# Patient Record
Sex: Female | Born: 1960 | Race: White | Hispanic: No | Smoking: Former smoker
Health system: Southern US, Community
[De-identification: ages and names within clinical notes are randomized; demographics above are authoritative.]

## PROBLEM LIST (undated history)

## (undated) DIAGNOSIS — K859 Acute pancreatitis without necrosis or infection, unspecified: Secondary | ICD-10-CM

## (undated) DIAGNOSIS — E7800 Pure hypercholesterolemia, unspecified: Secondary | ICD-10-CM

## (undated) DIAGNOSIS — M1990 Unspecified osteoarthritis, unspecified site: Secondary | ICD-10-CM

## (undated) DIAGNOSIS — M797 Fibromyalgia: Secondary | ICD-10-CM

## (undated) DIAGNOSIS — R011 Cardiac murmur, unspecified: Secondary | ICD-10-CM

## (undated) DIAGNOSIS — F329 Major depressive disorder, single episode, unspecified: Secondary | ICD-10-CM

## (undated) DIAGNOSIS — F32A Depression, unspecified: Secondary | ICD-10-CM

## (undated) DIAGNOSIS — I10 Essential (primary) hypertension: Secondary | ICD-10-CM

## (undated) DIAGNOSIS — E039 Hypothyroidism, unspecified: Secondary | ICD-10-CM

## (undated) HISTORY — PX: OTHER SURGICAL HISTORY: SHX169

## (undated) HISTORY — PX: REPLACEMENT TOTAL HIP W/  RESURFACING IMPLANTS: SUR1222

## (undated) HISTORY — PX: TONSILLECTOMY: SUR1361

## (undated) HISTORY — PX: ABDOMINAL SURGERY: SHX537

## (undated) HISTORY — PX: ABDOMINAL HYSTERECTOMY: SHX81

## (undated) HISTORY — DX: Acute pancreatitis without necrosis or infection, unspecified: K85.90

## (undated) HISTORY — PX: HERNIA REPAIR: SHX51

---

## 1998-01-18 ENCOUNTER — Other Ambulatory Visit: Admission: RE | Admit: 1998-01-18 | Discharge: 1998-01-18 | Payer: Self-pay | Admitting: Obstetrics and Gynecology

## 1998-12-23 ENCOUNTER — Emergency Department (HOSPITAL_COMMUNITY): Admission: EM | Admit: 1998-12-23 | Discharge: 1998-12-23 | Payer: Self-pay | Admitting: Emergency Medicine

## 1999-06-16 ENCOUNTER — Other Ambulatory Visit: Admission: RE | Admit: 1999-06-16 | Discharge: 1999-06-16 | Payer: Self-pay | Admitting: Obstetrics and Gynecology

## 2000-09-27 ENCOUNTER — Inpatient Hospital Stay (HOSPITAL_COMMUNITY): Admission: EM | Admit: 2000-09-27 | Discharge: 2000-10-04 | Payer: Self-pay | Admitting: Psychiatry

## 2000-11-12 ENCOUNTER — Inpatient Hospital Stay (HOSPITAL_COMMUNITY): Admission: EM | Admit: 2000-11-12 | Discharge: 2000-11-19 | Payer: Self-pay | Admitting: Psychiatry

## 2003-02-06 ENCOUNTER — Emergency Department (HOSPITAL_COMMUNITY): Admission: EM | Admit: 2003-02-06 | Discharge: 2003-02-07 | Payer: Self-pay | Admitting: Emergency Medicine

## 2003-02-06 ENCOUNTER — Encounter: Payer: Self-pay | Admitting: Emergency Medicine

## 2003-02-23 ENCOUNTER — Ambulatory Visit (HOSPITAL_COMMUNITY): Admission: RE | Admit: 2003-02-23 | Discharge: 2003-02-23 | Payer: Self-pay | Admitting: Family Medicine

## 2003-02-23 ENCOUNTER — Encounter: Payer: Self-pay | Admitting: Family Medicine

## 2003-09-22 ENCOUNTER — Ambulatory Visit (HOSPITAL_COMMUNITY): Admission: RE | Admit: 2003-09-22 | Discharge: 2003-09-22 | Payer: Self-pay | Admitting: Internal Medicine

## 2004-05-31 ENCOUNTER — Ambulatory Visit (HOSPITAL_COMMUNITY): Admission: RE | Admit: 2004-05-31 | Discharge: 2004-05-31 | Payer: Self-pay | Admitting: Family Medicine

## 2004-07-11 ENCOUNTER — Ambulatory Visit: Payer: Self-pay | Admitting: Nurse Practitioner

## 2004-07-23 ENCOUNTER — Emergency Department (HOSPITAL_COMMUNITY): Admission: EM | Admit: 2004-07-23 | Discharge: 2004-07-23 | Payer: Self-pay | Admitting: Emergency Medicine

## 2004-08-24 ENCOUNTER — Ambulatory Visit: Payer: Self-pay | Admitting: Nurse Practitioner

## 2004-10-03 ENCOUNTER — Ambulatory Visit: Payer: Self-pay | Admitting: Nurse Practitioner

## 2004-10-04 ENCOUNTER — Ambulatory Visit: Payer: Self-pay | Admitting: Nurse Practitioner

## 2004-12-22 ENCOUNTER — Ambulatory Visit: Payer: Self-pay | Admitting: Nurse Practitioner

## 2004-12-23 ENCOUNTER — Ambulatory Visit (HOSPITAL_COMMUNITY): Admission: RE | Admit: 2004-12-23 | Discharge: 2004-12-23 | Payer: Self-pay | Admitting: Nurse Practitioner

## 2005-01-30 ENCOUNTER — Ambulatory Visit: Payer: Self-pay | Admitting: Nurse Practitioner

## 2005-03-13 ENCOUNTER — Ambulatory Visit: Payer: Self-pay | Admitting: Nurse Practitioner

## 2005-04-10 ENCOUNTER — Ambulatory Visit (HOSPITAL_COMMUNITY): Admission: RE | Admit: 2005-04-10 | Discharge: 2005-04-10 | Payer: Self-pay | Admitting: Internal Medicine

## 2005-04-10 ENCOUNTER — Ambulatory Visit: Payer: Self-pay | Admitting: Nurse Practitioner

## 2005-05-29 ENCOUNTER — Encounter (INDEPENDENT_AMBULATORY_CARE_PROVIDER_SITE_OTHER): Payer: Self-pay | Admitting: Nurse Practitioner

## 2005-05-29 LAB — CONVERTED CEMR LAB

## 2005-06-07 ENCOUNTER — Ambulatory Visit: Payer: Self-pay | Admitting: Nurse Practitioner

## 2005-06-12 ENCOUNTER — Ambulatory Visit (HOSPITAL_COMMUNITY): Admission: RE | Admit: 2005-06-12 | Discharge: 2005-06-12 | Payer: Self-pay | Admitting: Nurse Practitioner

## 2005-06-21 ENCOUNTER — Ambulatory Visit: Payer: Self-pay | Admitting: Nurse Practitioner

## 2005-06-28 ENCOUNTER — Emergency Department (HOSPITAL_COMMUNITY): Admission: EM | Admit: 2005-06-28 | Discharge: 2005-06-28 | Payer: Self-pay | Admitting: Emergency Medicine

## 2005-07-18 ENCOUNTER — Ambulatory Visit: Payer: Self-pay | Admitting: Nurse Practitioner

## 2005-07-23 ENCOUNTER — Emergency Department (HOSPITAL_COMMUNITY): Admission: EM | Admit: 2005-07-23 | Discharge: 2005-07-24 | Payer: Self-pay | Admitting: *Deleted

## 2005-08-13 ENCOUNTER — Ambulatory Visit: Payer: Self-pay | Admitting: Nurse Practitioner

## 2005-09-18 ENCOUNTER — Ambulatory Visit: Payer: Self-pay | Admitting: Nurse Practitioner

## 2005-11-13 ENCOUNTER — Ambulatory Visit: Payer: Self-pay | Admitting: Nurse Practitioner

## 2005-12-26 ENCOUNTER — Ambulatory Visit: Payer: Self-pay | Admitting: Nurse Practitioner

## 2006-01-29 ENCOUNTER — Ambulatory Visit: Payer: Self-pay | Admitting: Nurse Practitioner

## 2006-03-19 ENCOUNTER — Ambulatory Visit: Payer: Self-pay | Admitting: Nurse Practitioner

## 2006-05-14 ENCOUNTER — Ambulatory Visit: Payer: Self-pay | Admitting: Nurse Practitioner

## 2006-07-17 ENCOUNTER — Ambulatory Visit: Payer: Self-pay | Admitting: Nurse Practitioner

## 2006-08-07 ENCOUNTER — Ambulatory Visit: Payer: Self-pay | Admitting: Nurse Practitioner

## 2006-09-12 ENCOUNTER — Ambulatory Visit: Payer: Self-pay | Admitting: Nurse Practitioner

## 2006-09-24 ENCOUNTER — Encounter
Admission: RE | Admit: 2006-09-24 | Discharge: 2006-12-23 | Payer: Self-pay | Admitting: Physical Medicine and Rehabilitation

## 2006-10-09 ENCOUNTER — Ambulatory Visit: Payer: Self-pay | Admitting: Physical Medicine and Rehabilitation

## 2006-10-14 ENCOUNTER — Ambulatory Visit: Payer: Self-pay | Admitting: Family Medicine

## 2006-10-16 ENCOUNTER — Encounter
Admission: RE | Admit: 2006-10-16 | Discharge: 2006-10-16 | Payer: Self-pay | Admitting: Physical Medicine and Rehabilitation

## 2006-11-04 ENCOUNTER — Ambulatory Visit: Payer: Self-pay | Admitting: Internal Medicine

## 2006-11-07 ENCOUNTER — Ambulatory Visit (HOSPITAL_COMMUNITY)
Admission: RE | Admit: 2006-11-07 | Discharge: 2006-11-07 | Payer: Self-pay | Admitting: Physical Medicine and Rehabilitation

## 2006-11-13 ENCOUNTER — Ambulatory Visit: Payer: Self-pay | Admitting: Internal Medicine

## 2006-12-03 ENCOUNTER — Ambulatory Visit: Payer: Self-pay | Admitting: Physical Medicine and Rehabilitation

## 2006-12-03 ENCOUNTER — Encounter
Admission: RE | Admit: 2006-12-03 | Discharge: 2007-03-03 | Payer: Self-pay | Admitting: Physical Medicine and Rehabilitation

## 2006-12-04 ENCOUNTER — Encounter: Payer: Self-pay | Admitting: Internal Medicine

## 2006-12-10 ENCOUNTER — Ambulatory Visit: Payer: Self-pay | Admitting: Nurse Practitioner

## 2007-01-14 ENCOUNTER — Ambulatory Visit: Payer: Self-pay | Admitting: Nurse Practitioner

## 2007-03-29 ENCOUNTER — Encounter (INDEPENDENT_AMBULATORY_CARE_PROVIDER_SITE_OTHER): Payer: Self-pay | Admitting: Nurse Practitioner

## 2007-03-29 DIAGNOSIS — E669 Obesity, unspecified: Secondary | ICD-10-CM | POA: Insufficient documentation

## 2007-03-29 DIAGNOSIS — F411 Generalized anxiety disorder: Secondary | ICD-10-CM | POA: Insufficient documentation

## 2007-03-29 DIAGNOSIS — F3130 Bipolar disorder, current episode depressed, mild or moderate severity, unspecified: Secondary | ICD-10-CM | POA: Insufficient documentation

## 2007-03-29 DIAGNOSIS — Z9079 Acquired absence of other genital organ(s): Secondary | ICD-10-CM | POA: Insufficient documentation

## 2007-03-29 DIAGNOSIS — E039 Hypothyroidism, unspecified: Secondary | ICD-10-CM | POA: Insufficient documentation

## 2007-03-29 DIAGNOSIS — M159 Polyosteoarthritis, unspecified: Secondary | ICD-10-CM | POA: Insufficient documentation

## 2007-03-29 DIAGNOSIS — G894 Chronic pain syndrome: Secondary | ICD-10-CM | POA: Insufficient documentation

## 2007-03-29 DIAGNOSIS — IMO0002 Reserved for concepts with insufficient information to code with codable children: Secondary | ICD-10-CM | POA: Insufficient documentation

## 2007-04-16 ENCOUNTER — Ambulatory Visit: Payer: Self-pay | Admitting: Internal Medicine

## 2007-04-23 ENCOUNTER — Emergency Department (HOSPITAL_COMMUNITY): Admission: EM | Admit: 2007-04-23 | Discharge: 2007-04-23 | Payer: Self-pay | Admitting: Emergency Medicine

## 2007-06-05 ENCOUNTER — Ambulatory Visit: Payer: Self-pay | Admitting: Family Medicine

## 2007-06-17 ENCOUNTER — Ambulatory Visit: Payer: Self-pay | Admitting: Internal Medicine

## 2007-08-07 ENCOUNTER — Ambulatory Visit: Payer: Self-pay | Admitting: Family Medicine

## 2007-08-22 ENCOUNTER — Ambulatory Visit: Payer: Self-pay | Admitting: Family Medicine

## 2007-10-14 ENCOUNTER — Emergency Department (HOSPITAL_COMMUNITY): Admission: EM | Admit: 2007-10-14 | Discharge: 2007-10-14 | Payer: Self-pay | Admitting: Emergency Medicine

## 2007-11-02 ENCOUNTER — Emergency Department (HOSPITAL_COMMUNITY): Admission: EM | Admit: 2007-11-02 | Discharge: 2007-11-02 | Payer: Self-pay | Admitting: Emergency Medicine

## 2007-12-27 ENCOUNTER — Emergency Department (HOSPITAL_COMMUNITY): Admission: EM | Admit: 2007-12-27 | Discharge: 2007-12-27 | Payer: Self-pay | Admitting: Family Medicine

## 2008-02-12 ENCOUNTER — Encounter (INDEPENDENT_AMBULATORY_CARE_PROVIDER_SITE_OTHER): Payer: Self-pay | Admitting: Nurse Practitioner

## 2008-02-12 ENCOUNTER — Ambulatory Visit: Payer: Self-pay | Admitting: Family Medicine

## 2008-02-12 LAB — CONVERTED CEMR LAB
ALT: 20 units/L (ref 0–35)
AST: 17 units/L (ref 0–37)
Albumin: 4.3 g/dL (ref 3.5–5.2)
Alkaline Phosphatase: 94 units/L (ref 39–117)
BUN: 9 mg/dL (ref 6–23)
Basophils Absolute: 0 10*3/uL (ref 0.0–0.1)
Basophils Relative: 0 % (ref 0–1)
CO2: 25 meq/L (ref 19–32)
Calcium: 9.3 mg/dL (ref 8.4–10.5)
Chloride: 107 meq/L (ref 96–112)
Cholesterol: 219 mg/dL — ABNORMAL HIGH (ref 0–200)
Creatinine, Ser: 0.94 mg/dL (ref 0.40–1.20)
Eosinophils Absolute: 0.4 10*3/uL (ref 0.0–0.7)
Eosinophils Relative: 5 % (ref 0–5)
Glucose, Bld: 92 mg/dL (ref 70–99)
HCT: 48.2 % — ABNORMAL HIGH (ref 36.0–46.0)
HDL: 39 mg/dL — ABNORMAL LOW (ref >39)
Hemoglobin: 15.2 g/dL — ABNORMAL HIGH (ref 12.0–15.0)
LDL Cholesterol: 134 mg/dL — ABNORMAL HIGH (ref 0–99)
Lymphocytes Relative: 32 % (ref 12–46)
Lymphs Abs: 2.5 10*3/uL (ref 0.7–4.0)
MCHC: 31.5 g/dL (ref 30.0–36.0)
MCV: 95.1 fL (ref 78.0–100.0)
Monocytes Absolute: 0.8 10*3/uL (ref 0.1–1.0)
Monocytes Relative: 10 % (ref 3–12)
Neutro Abs: 4.2 10*3/uL (ref 1.7–7.7)
Neutrophils Relative %: 53 % (ref 43–77)
Platelets: 343 10*3/uL (ref 150–400)
Potassium: 4.6 meq/L (ref 3.5–5.3)
RBC: 5.07 M/uL (ref 3.87–5.11)
RDW: 14.4 % (ref 11.5–15.5)
Sodium: 142 meq/L (ref 135–145)
TSH: 0.446 microintl units/mL (ref 0.350–5.50)
Total Bilirubin: 0.6 mg/dL (ref 0.3–1.2)
Total CHOL/HDL Ratio: 5.6
Total Protein: 7.3 g/dL (ref 6.0–8.3)
Triglycerides: 231 mg/dL — ABNORMAL HIGH (ref <150)
VLDL: 46 mg/dL — ABNORMAL HIGH (ref 0–40)
WBC: 7.9 10*3/uL (ref 4.0–10.5)

## 2008-05-11 ENCOUNTER — Ambulatory Visit: Payer: Self-pay | Admitting: Internal Medicine

## 2008-05-14 ENCOUNTER — Ambulatory Visit (HOSPITAL_COMMUNITY): Admission: RE | Admit: 2008-05-14 | Discharge: 2008-05-14 | Payer: Self-pay | Admitting: Internal Medicine

## 2008-06-15 ENCOUNTER — Emergency Department (HOSPITAL_COMMUNITY): Admission: EM | Admit: 2008-06-15 | Discharge: 2008-06-15 | Payer: Self-pay | Admitting: Emergency Medicine

## 2009-04-12 ENCOUNTER — Encounter: Payer: Self-pay | Admitting: Internal Medicine

## 2009-04-12 ENCOUNTER — Ambulatory Visit: Payer: Self-pay | Admitting: Pain Medicine

## 2009-04-18 ENCOUNTER — Encounter: Payer: Self-pay | Admitting: Internal Medicine

## 2009-04-18 ENCOUNTER — Ambulatory Visit: Payer: Self-pay | Admitting: Pain Medicine

## 2009-05-03 ENCOUNTER — Ambulatory Visit: Payer: Self-pay | Admitting: Family Medicine

## 2009-05-03 ENCOUNTER — Encounter (INDEPENDENT_AMBULATORY_CARE_PROVIDER_SITE_OTHER): Payer: Self-pay | Admitting: Internal Medicine

## 2009-05-03 LAB — CONVERTED CEMR LAB
ALT: 19 units/L (ref 0–35)
AST: 20 units/L (ref 0–37)
Albumin: 4.1 g/dL (ref 3.5–5.2)
Alkaline Phosphatase: 81 units/L (ref 39–117)
Amphetamine Screen, Ur: NEGATIVE
BUN: 14 mg/dL (ref 6–23)
Barbiturate Quant, Ur: NEGATIVE
Basophils Absolute: 0 10*3/uL (ref 0.0–0.1)
Basophils Relative: 0 % (ref 0–1)
Benzodiazepines.: NEGATIVE
CO2: 30 meq/L (ref 19–32)
Calcium: 8.9 mg/dL (ref 8.4–10.5)
Chloride: 103 meq/L (ref 96–112)
Cholesterol: 193 mg/dL (ref 0–200)
Cocaine Metabolites: POSITIVE — AB
Creatinine, Ser: 0.95 mg/dL (ref 0.40–1.20)
Creatinine,U: 180.6 mg/dL
Eosinophils Absolute: 0.4 10*3/uL (ref 0.0–0.7)
Eosinophils Relative: 5 % (ref 0–5)
Glucose, Bld: 100 mg/dL — ABNORMAL HIGH (ref 70–99)
HCT: 44.4 % (ref 36.0–46.0)
HDL: 53 mg/dL (ref >39)
Hemoglobin: 14.3 g/dL (ref 12.0–15.0)
LDL Cholesterol: 113 mg/dL — ABNORMAL HIGH (ref 0–99)
Lymphocytes Relative: 31 % (ref 12–46)
Lymphs Abs: 2.4 10*3/uL (ref 0.7–4.0)
MCHC: 32.2 g/dL (ref 30.0–36.0)
MCV: 97.2 fL (ref 78.0–100.0)
Marijuana Metabolite: POSITIVE — AB
Methadone: NEGATIVE
Monocytes Absolute: 0.8 10*3/uL (ref 0.1–1.0)
Monocytes Relative: 10 % (ref 3–12)
Neutro Abs: 4.3 10*3/uL (ref 1.7–7.7)
Neutrophils Relative %: 55 % (ref 43–77)
Opiate Screen, Urine: POSITIVE — AB
Phencyclidine (PCP): NEGATIVE
Platelets: 344 10*3/uL (ref 150–400)
Potassium: 3.5 meq/L (ref 3.5–5.3)
Propoxyphene: NEGATIVE
RBC: 4.57 M/uL (ref 3.87–5.11)
RDW: 15.1 % (ref 11.5–15.5)
Sodium: 142 meq/L (ref 135–145)
TSH: 15.621 microintl units/mL — ABNORMAL HIGH (ref 0.350–4.500)
Total Bilirubin: 0.3 mg/dL (ref 0.3–1.2)
Total CHOL/HDL Ratio: 3.6
Total Protein: 7 g/dL (ref 6.0–8.3)
Triglycerides: 133 mg/dL (ref <150)
VLDL: 27 mg/dL (ref 0–40)
WBC: 7.9 10*3/uL (ref 4.0–10.5)

## 2009-05-04 ENCOUNTER — Ambulatory Visit (HOSPITAL_COMMUNITY): Admission: RE | Admit: 2009-05-04 | Discharge: 2009-05-04 | Payer: Self-pay | Admitting: Family Medicine

## 2009-05-25 ENCOUNTER — Ambulatory Visit: Payer: Self-pay | Admitting: Internal Medicine

## 2009-05-25 DIAGNOSIS — F3289 Other specified depressive episodes: Secondary | ICD-10-CM | POA: Insufficient documentation

## 2009-05-25 DIAGNOSIS — M129 Arthropathy, unspecified: Secondary | ICD-10-CM | POA: Insufficient documentation

## 2009-05-25 DIAGNOSIS — F32A Depression, unspecified: Secondary | ICD-10-CM | POA: Insufficient documentation

## 2009-05-25 DIAGNOSIS — F1910 Other psychoactive substance abuse, uncomplicated: Secondary | ICD-10-CM | POA: Insufficient documentation

## 2009-05-25 DIAGNOSIS — F329 Major depressive disorder, single episode, unspecified: Secondary | ICD-10-CM

## 2009-05-25 DIAGNOSIS — J45909 Unspecified asthma, uncomplicated: Secondary | ICD-10-CM | POA: Insufficient documentation

## 2009-05-25 DIAGNOSIS — I10 Essential (primary) hypertension: Secondary | ICD-10-CM | POA: Insufficient documentation

## 2009-05-25 DIAGNOSIS — E785 Hyperlipidemia, unspecified: Secondary | ICD-10-CM | POA: Insufficient documentation

## 2009-05-25 DIAGNOSIS — R011 Cardiac murmur, unspecified: Secondary | ICD-10-CM | POA: Insufficient documentation

## 2009-05-25 LAB — CONVERTED CEMR LAB: Pap Smear: NORMAL

## 2009-06-01 ENCOUNTER — Encounter: Payer: Self-pay | Admitting: Internal Medicine

## 2009-06-21 ENCOUNTER — Encounter (INDEPENDENT_AMBULATORY_CARE_PROVIDER_SITE_OTHER): Payer: Self-pay | Admitting: *Deleted

## 2009-08-30 ENCOUNTER — Telehealth (INDEPENDENT_AMBULATORY_CARE_PROVIDER_SITE_OTHER): Payer: Self-pay | Admitting: *Deleted

## 2009-09-07 ENCOUNTER — Emergency Department (HOSPITAL_COMMUNITY): Admission: EM | Admit: 2009-09-07 | Discharge: 2009-09-07 | Payer: Self-pay | Admitting: Emergency Medicine

## 2009-12-12 ENCOUNTER — Telehealth: Payer: Self-pay | Admitting: Internal Medicine

## 2010-02-21 ENCOUNTER — Encounter (INDEPENDENT_AMBULATORY_CARE_PROVIDER_SITE_OTHER): Payer: Self-pay | Admitting: Internal Medicine

## 2010-02-21 ENCOUNTER — Ambulatory Visit: Payer: Self-pay | Admitting: Family Medicine

## 2010-02-21 LAB — CONVERTED CEMR LAB
ALT: 12 units/L (ref 0–35)
AST: 17 units/L (ref 0–37)
Albumin: 4.7 g/dL (ref 3.5–5.2)
Alkaline Phosphatase: 64 units/L (ref 39–117)
BUN: 10 mg/dL (ref 6–23)
Basophils Absolute: 0 10*3/uL (ref 0.0–0.1)
Basophils Relative: 0 % (ref 0–1)
CO2: 25 meq/L (ref 19–32)
CRP: 0.5 mg/dL (ref <0.6)
Calcium: 9.5 mg/dL (ref 8.4–10.5)
Chloride: 102 meq/L (ref 96–112)
Cholesterol: 246 mg/dL — ABNORMAL HIGH (ref 0–200)
Creatinine, Ser: 1 mg/dL (ref 0.40–1.20)
Eosinophils Absolute: 0.2 10*3/uL (ref 0.0–0.7)
Eosinophils Relative: 3 % (ref 0–5)
Glucose, Bld: 66 mg/dL — ABNORMAL LOW (ref 70–99)
HCT: 48.3 % — ABNORMAL HIGH (ref 36.0–46.0)
HDL: 47 mg/dL (ref >39)
Hemoglobin: 15.5 g/dL — ABNORMAL HIGH (ref 12.0–15.0)
LDL Cholesterol: 168 mg/dL — ABNORMAL HIGH (ref 0–99)
Lymphocytes Relative: 29 % (ref 12–46)
Lymphs Abs: 2.2 10*3/uL (ref 0.7–4.0)
MCHC: 32.1 g/dL (ref 30.0–36.0)
MCV: 94.7 fL (ref 78.0–100.0)
Monocytes Absolute: 0.5 10*3/uL (ref 0.1–1.0)
Monocytes Relative: 6 % (ref 3–12)
Neutro Abs: 4.7 10*3/uL (ref 1.7–7.7)
Neutrophils Relative %: 61 % (ref 43–77)
Platelets: 328 10*3/uL (ref 150–400)
Potassium: 4.5 meq/L (ref 3.5–5.3)
RBC: 5.1 M/uL (ref 3.87–5.11)
RDW: 14.9 % (ref 11.5–15.5)
Sodium: 139 meq/L (ref 135–145)
TSH: 113.543 microintl units/mL — ABNORMAL HIGH (ref 0.350–4.500)
Total Bilirubin: 0.9 mg/dL (ref 0.3–1.2)
Total CHOL/HDL Ratio: 5.2
Total Protein: 7.5 g/dL (ref 6.0–8.3)
Triglycerides: 153 mg/dL — ABNORMAL HIGH (ref <150)
VLDL: 31 mg/dL (ref 0–40)
WBC: 7.7 10*3/uL (ref 4.0–10.5)

## 2010-03-16 ENCOUNTER — Emergency Department (HOSPITAL_COMMUNITY): Admission: EM | Admit: 2010-03-16 | Discharge: 2010-03-16 | Payer: Self-pay | Admitting: Emergency Medicine

## 2010-06-03 ENCOUNTER — Emergency Department (HOSPITAL_COMMUNITY): Admission: EM | Admit: 2010-06-03 | Discharge: 2010-06-03 | Payer: Self-pay | Admitting: Family Medicine

## 2010-09-16 ENCOUNTER — Emergency Department (HOSPITAL_COMMUNITY): Admission: EM | Admit: 2010-09-16 | Discharge: 2010-09-16 | Payer: Self-pay | Admitting: Emergency Medicine

## 2010-10-05 ENCOUNTER — Encounter: Payer: Self-pay | Admitting: Internal Medicine

## 2010-10-06 LAB — CONVERTED CEMR LAB
ALT: 15 units/L (ref 0–35)
AST: 17 units/L (ref 0–37)
Albumin: 4.7 g/dL (ref 3.5–5.2)
Alkaline Phosphatase: 84 units/L (ref 39–117)
BUN: 9 mg/dL (ref 6–23)
CO2: 31 meq/L (ref 19–32)
Calcium: 9.6 mg/dL (ref 8.4–10.5)
Chloride: 98 meq/L (ref 96–112)
Cholesterol: 159 mg/dL (ref 0–200)
Creatinine, Ser: 1.01 mg/dL (ref 0.40–1.20)
Glucose, Bld: 62 mg/dL — ABNORMAL LOW (ref 70–99)
HDL: 40 mg/dL (ref >39)
LDL Cholesterol: 75 mg/dL (ref 0–99)
Potassium: 3.8 meq/L (ref 3.5–5.3)
Sodium: 139 meq/L (ref 135–145)
TSH: 38.001 microintl units/mL — ABNORMAL HIGH (ref 0.350–4.500)
Total Bilirubin: 0.7 mg/dL (ref 0.3–1.2)
Total CHOL/HDL Ratio: 4
Total Protein: 7.2 g/dL (ref 6.0–8.3)
Triglycerides: 220 mg/dL — ABNORMAL HIGH (ref <150)
VLDL: 44 mg/dL — ABNORMAL HIGH (ref 0–40)

## 2010-11-18 ENCOUNTER — Encounter: Payer: Self-pay | Admitting: Internal Medicine

## 2010-11-28 NOTE — Progress Notes (Signed)
Summary: estradiol  Phone Note Refill Request Message from:  Fax from Pharmacy on December 12, 2009 3:18 PM  Refills Requested: Medication #1:  ESTRACE 1 MG TABS take 1 by mouth qd   Last Refilled: 11/08/2009  Method Requested: Electronic Initial call taken by: Orlan Leavens,  December 12, 2009 3:18 PM    Prescriptions: ESTRACE 1 MG TABS (ESTRADIOL) take 1 by mouth qd  #30 x 5   Entered by:   Orlan Leavens   Authorized by:   Newt Lukes MD   Signed by:   Orlan Leavens on 12/12/2009   Method used:   Electronically to        [facility]  [address] [phone]* (retail)       [address]       [place], [place]  [address]       Ph: [phone]       Fax: [phone]   RxID:   [id number]

## 2011-01-12 LAB — URINE CULTURE
Colony Count: 35000
Culture  Setup Time: 201108062022

## 2011-01-12 LAB — POCT URINALYSIS DIPSTICK
Glucose, UA: NEGATIVE mg/dL
Hgb urine dipstick: NEGATIVE
Nitrite: NEGATIVE
Protein, ur: NEGATIVE mg/dL
Specific Gravity, Urine: 1.02 (ref 1.005–1.030)
Urobilinogen, UA: 1 mg/dL (ref 0.0–1.0)
pH: 7 (ref 5.0–8.0)

## 2011-01-12 LAB — GC/CHLAMYDIA PROBE AMP, GENITAL
Chlamydia, DNA Probe: NEGATIVE
GC Probe Amp, Genital: NEGATIVE

## 2011-01-12 LAB — WET PREP, GENITAL
Clue Cells Wet Prep HPF POC: NONE SEEN
Yeast Wet Prep HPF POC: NONE SEEN

## 2011-01-31 LAB — URINALYSIS, ROUTINE W REFLEX MICROSCOPIC
Bilirubin Urine: NEGATIVE
Glucose, UA: NEGATIVE mg/dL
Hgb urine dipstick: NEGATIVE
Ketones, ur: NEGATIVE mg/dL
Nitrite: NEGATIVE
Protein, ur: NEGATIVE mg/dL
Specific Gravity, Urine: 1.02 (ref 1.005–1.030)
Urobilinogen, UA: 1 mg/dL (ref 0.0–1.0)
pH: 5.5 (ref 5.0–8.0)

## 2011-01-31 LAB — RAPID URINE DRUG SCREEN, HOSP PERFORMED
Amphetamines: NOT DETECTED
Barbiturates: NOT DETECTED
Benzodiazepines: NOT DETECTED
Cocaine: POSITIVE — AB
Opiates: POSITIVE — AB
Tetrahydrocannabinol: POSITIVE — AB

## 2011-01-31 LAB — BASIC METABOLIC PANEL
BUN: 10 mg/dL (ref 6–23)
CO2: 30 mEq/L (ref 19–32)
Calcium: 8.7 mg/dL (ref 8.4–10.5)
Chloride: 100 mEq/L (ref 96–112)
Creatinine, Ser: 0.95 mg/dL (ref 0.4–1.2)
GFR calc Af Amer: >60 mL/min (ref >60)
GFR calc non Af Amer: >60 mL/min (ref >60)
Glucose, Bld: 93 mg/dL (ref 70–99)
Potassium: 3.3 mEq/L — ABNORMAL LOW (ref 3.5–5.1)
Sodium: 136 mEq/L (ref 135–145)

## 2011-01-31 LAB — CBC
HCT: 43.2 % (ref 36.0–46.0)
Hemoglobin: 14.9 g/dL (ref 12.0–15.0)
MCHC: 34.5 g/dL (ref 30.0–36.0)
MCV: 91.3 fL (ref 78.0–100.0)
Platelets: 338 10*3/uL (ref 150–400)
RBC: 4.73 MIL/uL (ref 3.87–5.11)
RDW: 13.8 % (ref 11.5–15.5)
WBC: 18.6 10*3/uL — ABNORMAL HIGH (ref 4.0–10.5)

## 2011-01-31 LAB — DIFFERENTIAL
Basophils Absolute: 0 10*3/uL (ref 0.0–0.1)
Eosinophils Absolute: 0.2 10*3/uL (ref 0.0–0.7)
Lymphs Abs: 1.9 10*3/uL (ref 0.7–4.0)
Monocytes Relative: 4 % (ref 3–12)
Neutro Abs: 15.8 10*3/uL — ABNORMAL HIGH (ref 1.7–7.7)

## 2011-01-31 LAB — GLUCOSE, CAPILLARY

## 2011-02-01 ENCOUNTER — Inpatient Hospital Stay (INDEPENDENT_AMBULATORY_CARE_PROVIDER_SITE_OTHER)
Admission: RE | Admit: 2011-02-01 | Discharge: 2011-02-01 | Disposition: A | Discharge status: Home / Self Care | Payer: Medicare Other | Source: Ambulatory Visit | Attending: Family Medicine | Admitting: Family Medicine

## 2011-02-01 DIAGNOSIS — M79609 Pain in unspecified limb: Secondary | ICD-10-CM

## 2011-02-01 DIAGNOSIS — N760 Acute vaginitis: Secondary | ICD-10-CM

## 2011-02-01 LAB — WET PREP, GENITAL

## 2011-02-02 LAB — GC/CHLAMYDIA PROBE AMP, GENITAL
Chlamydia, DNA Probe: NEGATIVE
GC Probe Amp, Genital: NEGATIVE
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
## 2011-03-16 NOTE — Assessment & Plan Note (Signed)
Jill Robertson is a 50 year old divorcee now who is being seen in our  pain and rehabilitative clinic predominantly for cervicalgia.   She was last seen by me on 11/06/2005.   She also maintains contact with Dr. Duke Salvia through [facility] and  also is followed by Dr. De Nurse at mental health.   She is back in today for a recheck and refill of her medications.   She states her neck pain is somewhat improved from the last visit.  Her  pain score at the last visit was a 9 on a scale of 10, currently is a 6.  Her pain is described as rather sharp and aching in nature.  It is  located at the cervical thoracic junction.   Pain is typically worse with sitting, standing activities; improves with  rest, medication, injections as well as a TENS unit.  At the last visit  she did obtain a TENS unit for a trial.  She finds it to be somewhat  beneficial.  She also has used a soft collar which also helps somewhat  as well.   She is independent with her self care.  She is able to climb stairs and  drive.  She does admit to depression and anxiety.  Denies suicidal  ideations.   She is quit aggravated today.  Apparently, she had been discontinued  from the Dilaudid and currently was trialed on Vicodin over the weekend,  and apparently she may have had a reaction to it.  Her pain medications  are currently provided by Dr. Duke Salvia as she is currently going to  be managed now narcotically through our clinic because she has positive  urine drug screen for marijuana metabolites.   No other changes in past medical history, social history or family  history since our last visit.   Medications provided by this clinic currently include:  1. Lidoderm on a p.r.n. basis.  2. Prilosec.  3. Neurontin 300 mg t.i.d.  The patient states she did not take this.  4. Voltaren 50 mg 1 p.o. b.i.d.  The patient states she is taking this      on a p.r.n. basis.  5. Ultracet 1 p.o. t.i.d.  She states  she has taken this medication.   PHYSICAL EXAMINATION:  VITAL SIGNS:  On exam today her blood pressure is  156/98, pulse 97, respirations 16, 100% saturated on room air.  GENERAL:  She is a well-developed, well-nourished female who appears her  stated age.  NEUROLOGICAL:  She is oriented x3.  Her speech is clear.  Her affect is  labile.  At times she is somewhat tearful during our interview as she  talks about her mother who has cancer and a friend who also has been  recently hospitalized.  She is able to follow commands easily.  MUSCULOSKELETAL:  Transitioning from sit to stand is independent without  any problems.  Her gait is normal in the room.  No pain behaviors are  observed.  She does have some limitations in cervical range of motion to  the left and reports some discomfort with end range.  She has full  shoulder range of motion.  Her lumbar spine:  Although mildly limited,  she does not complain of pain today.  Her gait is normal.  Romberg's  test, tandem gait are performed adequately.  Seated her reflexes are  evaluated and are 2+ in the upper and lower extremities.  No abnormal  tone is noted.  No  clonus is noted.  Motor strength is 5/5.  No sensory  deficits are appreciated.   IMPRESSION:  Cervical radiographs are reviewed with her today.  These  were done on 11/07/2006.  Flexion and extension cervical spine films, 6  views, were remarkable for degenerative disk disease at C5-C6 and C6-C7  with mild bilateral foraminal narrowing.  No acute findings.  No  instability was noted.  The results of these x-rays were reviewed with  Jill Robertson today.   PLAN:  We will refill her Ultracet 1 p.o. t.i.d. p.r.n. neck pain #90.  Continue to use soft collar on a p.r.n. basis.  Continue to use TENS  unit on a p.r.n. basis.  Encourage her to maintain contact with Dr. Emeline Darling  as well as Dr. Hortencia Pilar at mental health.  Will see her back in a month.            ______________________________  Brantley Stage, M.D.     DMK/MedQ  D:  12/04/2006 15:35:06  T:  12/04/2006 16:13:40  Job #:  [id number]   cc:   Duke Salvia, MD   De Nurse, MD
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
## 2011-04-02 ENCOUNTER — Emergency Department (HOSPITAL_COMMUNITY)
Admission: EM | Admit: 2011-04-02 | Discharge: 2011-04-02 | Discharge status: Against Medical Advice | Payer: Medicare Other | Attending: Emergency Medicine | Admitting: Emergency Medicine

## 2011-04-02 ENCOUNTER — Inpatient Hospital Stay (INDEPENDENT_AMBULATORY_CARE_PROVIDER_SITE_OTHER)
Admission: RE | Admit: 2011-04-02 | Discharge: 2011-04-02 | Disposition: A | Discharge status: Home / Self Care | Payer: Medicare Other | Source: Ambulatory Visit | Attending: Family Medicine | Admitting: Family Medicine

## 2011-04-02 ENCOUNTER — Ambulatory Visit (INDEPENDENT_AMBULATORY_CARE_PROVIDER_SITE_OTHER): Payer: Medicare Other

## 2011-04-02 ENCOUNTER — Ambulatory Visit (HOSPITAL_COMMUNITY)
Admission: RE | Admit: 2011-04-02 | Discharge: 2011-04-02 | Disposition: A | Discharge status: Home / Self Care | Payer: Medicare Other | Source: Ambulatory Visit | Attending: Emergency Medicine | Admitting: Emergency Medicine

## 2011-04-02 DIAGNOSIS — S022XXA Fracture of nasal bones, initial encounter for closed fracture: Secondary | ICD-10-CM

## 2011-04-02 DIAGNOSIS — M279 Disease of jaws, unspecified: Secondary | ICD-10-CM | POA: Insufficient documentation

## 2011-04-28 ENCOUNTER — Emergency Department (HOSPITAL_COMMUNITY)
Admission: EM | Admit: 2011-04-28 | Discharge: 2011-04-28 | Disposition: A | Discharge status: Home / Self Care | Payer: Medicare Other | Attending: Emergency Medicine | Admitting: Emergency Medicine

## 2011-04-28 DIAGNOSIS — M25519 Pain in unspecified shoulder: Secondary | ICD-10-CM | POA: Insufficient documentation

## 2011-04-28 DIAGNOSIS — M25419 Effusion, unspecified shoulder: Secondary | ICD-10-CM | POA: Insufficient documentation

## 2011-04-28 DIAGNOSIS — M542 Cervicalgia: Secondary | ICD-10-CM | POA: Insufficient documentation

## 2011-04-28 DIAGNOSIS — M25619 Stiffness of unspecified shoulder, not elsewhere classified: Secondary | ICD-10-CM | POA: Insufficient documentation

## 2011-04-28 DIAGNOSIS — E039 Hypothyroidism, unspecified: Secondary | ICD-10-CM | POA: Insufficient documentation

## 2011-04-28 DIAGNOSIS — X58XXXA Exposure to other specified factors, initial encounter: Secondary | ICD-10-CM | POA: Insufficient documentation

## 2011-04-28 DIAGNOSIS — F319 Bipolar disorder, unspecified: Secondary | ICD-10-CM | POA: Insufficient documentation

## 2011-04-28 DIAGNOSIS — IMO0002 Reserved for concepts with insufficient information to code with codable children: Secondary | ICD-10-CM | POA: Insufficient documentation

## 2011-04-28 DIAGNOSIS — J45909 Unspecified asthma, uncomplicated: Secondary | ICD-10-CM | POA: Insufficient documentation

## 2011-04-28 DIAGNOSIS — M549 Dorsalgia, unspecified: Secondary | ICD-10-CM | POA: Insufficient documentation

## 2011-04-28 DIAGNOSIS — G8929 Other chronic pain: Secondary | ICD-10-CM | POA: Insufficient documentation

## 2011-04-28 DIAGNOSIS — E7800 Pure hypercholesterolemia, unspecified: Secondary | ICD-10-CM | POA: Insufficient documentation

## 2011-04-28 DIAGNOSIS — I10 Essential (primary) hypertension: Secondary | ICD-10-CM | POA: Insufficient documentation

## 2011-07-13 ENCOUNTER — Emergency Department (HOSPITAL_COMMUNITY)
Admission: EM | Admit: 2011-07-13 | Discharge: 2011-07-13 | Discharge status: Against Medical Advice | Payer: Medicare Other | Attending: Pediatrics | Admitting: Pediatrics

## 2011-07-13 ENCOUNTER — Emergency Department (HOSPITAL_COMMUNITY)
Admission: EM | Admit: 2011-07-13 | Discharge: 2011-07-14 | Disposition: A | Discharge status: Home / Self Care | Payer: Medicare Other | Attending: Emergency Medicine | Admitting: Emergency Medicine

## 2011-07-13 DIAGNOSIS — K089 Disorder of teeth and supporting structures, unspecified: Secondary | ICD-10-CM | POA: Insufficient documentation

## 2011-07-13 DIAGNOSIS — K029 Dental caries, unspecified: Secondary | ICD-10-CM | POA: Insufficient documentation

## 2011-07-13 DIAGNOSIS — I10 Essential (primary) hypertension: Secondary | ICD-10-CM | POA: Insufficient documentation

## 2011-07-13 DIAGNOSIS — E7800 Pure hypercholesterolemia, unspecified: Secondary | ICD-10-CM | POA: Insufficient documentation

## 2011-07-13 DIAGNOSIS — E039 Hypothyroidism, unspecified: Secondary | ICD-10-CM | POA: Insufficient documentation

## 2011-07-14 ENCOUNTER — Emergency Department (HOSPITAL_COMMUNITY): Payer: Medicare Other

## 2011-08-15 ENCOUNTER — Emergency Department (HOSPITAL_COMMUNITY)
Admission: EM | Admit: 2011-08-15 | Discharge: 2011-08-15 | Disposition: A | Discharge status: Home / Self Care | Payer: Medicare Other | Attending: Emergency Medicine | Admitting: Emergency Medicine

## 2011-08-15 DIAGNOSIS — Z79899 Other long term (current) drug therapy: Secondary | ICD-10-CM | POA: Insufficient documentation

## 2011-08-15 DIAGNOSIS — E039 Hypothyroidism, unspecified: Secondary | ICD-10-CM | POA: Insufficient documentation

## 2011-08-15 DIAGNOSIS — Z203 Contact with and (suspected) exposure to rabies: Secondary | ICD-10-CM | POA: Insufficient documentation

## 2011-08-15 DIAGNOSIS — M542 Cervicalgia: Secondary | ICD-10-CM | POA: Insufficient documentation

## 2011-08-15 DIAGNOSIS — M549 Dorsalgia, unspecified: Secondary | ICD-10-CM | POA: Insufficient documentation

## 2011-08-15 DIAGNOSIS — S71009A Unspecified open wound, unspecified hip, initial encounter: Secondary | ICD-10-CM | POA: Insufficient documentation

## 2011-08-15 DIAGNOSIS — E7800 Pure hypercholesterolemia, unspecified: Secondary | ICD-10-CM | POA: Insufficient documentation

## 2011-08-15 DIAGNOSIS — I10 Essential (primary) hypertension: Secondary | ICD-10-CM | POA: Insufficient documentation

## 2011-08-15 DIAGNOSIS — F319 Bipolar disorder, unspecified: Secondary | ICD-10-CM | POA: Insufficient documentation

## 2011-08-15 DIAGNOSIS — S71109A Unspecified open wound, unspecified thigh, initial encounter: Secondary | ICD-10-CM | POA: Insufficient documentation

## 2011-08-15 DIAGNOSIS — W540XXA Bitten by dog, initial encounter: Secondary | ICD-10-CM | POA: Insufficient documentation

## 2011-08-15 DIAGNOSIS — J45909 Unspecified asthma, uncomplicated: Secondary | ICD-10-CM | POA: Insufficient documentation

## 2011-08-18 ENCOUNTER — Inpatient Hospital Stay (INDEPENDENT_AMBULATORY_CARE_PROVIDER_SITE_OTHER)
Admission: RE | Admit: 2011-08-18 | Discharge: 2011-08-18 | Disposition: A | Discharge status: Home / Self Care | Payer: Medicare Other | Source: Ambulatory Visit | Attending: Emergency Medicine | Admitting: Emergency Medicine

## 2011-08-18 DIAGNOSIS — Z23 Encounter for immunization: Secondary | ICD-10-CM

## 2011-08-23 ENCOUNTER — Inpatient Hospital Stay (INDEPENDENT_AMBULATORY_CARE_PROVIDER_SITE_OTHER)
Admission: RE | Admit: 2011-08-23 | Discharge: 2011-08-23 | Disposition: A | Discharge status: Home / Self Care | Payer: Medicare Other | Source: Ambulatory Visit | Attending: Family Medicine | Admitting: Family Medicine

## 2011-08-23 DIAGNOSIS — Z23 Encounter for immunization: Secondary | ICD-10-CM

## 2011-08-29 ENCOUNTER — Inpatient Hospital Stay (INDEPENDENT_AMBULATORY_CARE_PROVIDER_SITE_OTHER)
Admission: RE | Admit: 2011-08-29 | Discharge: 2011-08-29 | Disposition: A | Discharge status: Home / Self Care | Payer: Medicare Other | Source: Ambulatory Visit | Attending: Emergency Medicine | Admitting: Emergency Medicine

## 2011-08-29 DIAGNOSIS — W5581XA Bitten by other mammals, initial encounter: Secondary | ICD-10-CM

## 2011-08-29 DIAGNOSIS — S71009A Unspecified open wound, unspecified hip, initial encounter: Secondary | ICD-10-CM

## 2011-08-29 DIAGNOSIS — S71109A Unspecified open wound, unspecified thigh, initial encounter: Secondary | ICD-10-CM

## 2011-08-29 DIAGNOSIS — T148 Other injury of unspecified body region: Secondary | ICD-10-CM

## 2011-08-29 DIAGNOSIS — Z23 Encounter for immunization: Secondary | ICD-10-CM

## 2011-09-01 LAB — WOUND CULTURE

## 2011-09-03 NOTE — ED Notes (Signed)
Wound Culture R thigh: Abundant Staph. Aureus. Pt. adequately treated with Clindamycin.

## 2011-09-12 ENCOUNTER — Emergency Department (HOSPITAL_COMMUNITY)
Admission: EM | Admit: 2011-09-12 | Discharge: 2011-09-12 | Disposition: A | Discharge status: Home / Self Care | Payer: Medicare Other | Source: Home / Self Care

## 2011-09-12 ENCOUNTER — Encounter: Payer: Self-pay | Admitting: Emergency Medicine

## 2011-09-12 HISTORY — DX: Essential (primary) hypertension: I10

## 2011-09-12 HISTORY — DX: Pure hypercholesterolemia, unspecified: E78.00
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
## 2011-11-07 DIAGNOSIS — F329 Major depressive disorder, single episode, unspecified: Secondary | ICD-10-CM | POA: Diagnosis not present

## 2011-11-07 DIAGNOSIS — M545 Low back pain, unspecified: Secondary | ICD-10-CM | POA: Diagnosis not present

## 2011-11-07 DIAGNOSIS — F411 Generalized anxiety disorder: Secondary | ICD-10-CM | POA: Diagnosis not present

## 2011-11-07 DIAGNOSIS — F3289 Other specified depressive episodes: Secondary | ICD-10-CM | POA: Diagnosis not present

## 2011-11-07 DIAGNOSIS — F319 Bipolar disorder, unspecified: Secondary | ICD-10-CM | POA: Diagnosis not present

## 2011-11-28 DIAGNOSIS — F339 Major depressive disorder, recurrent, unspecified: Secondary | ICD-10-CM | POA: Diagnosis not present

## 2011-11-28 DIAGNOSIS — F3289 Other specified depressive episodes: Secondary | ICD-10-CM | POA: Diagnosis not present

## 2011-11-28 DIAGNOSIS — F329 Major depressive disorder, single episode, unspecified: Secondary | ICD-10-CM | POA: Diagnosis not present

## 2011-12-05 DIAGNOSIS — M25519 Pain in unspecified shoulder: Secondary | ICD-10-CM | POA: Diagnosis not present

## 2011-12-05 DIAGNOSIS — G8929 Other chronic pain: Secondary | ICD-10-CM | POA: Diagnosis not present

## 2011-12-05 DIAGNOSIS — F411 Generalized anxiety disorder: Secondary | ICD-10-CM | POA: Diagnosis not present

## 2011-12-05 DIAGNOSIS — G44209 Tension-type headache, unspecified, not intractable: Secondary | ICD-10-CM | POA: Diagnosis not present

## 2011-12-26 ENCOUNTER — Other Ambulatory Visit (HOSPITAL_COMMUNITY): Payer: Self-pay | Admitting: Internal Medicine

## 2011-12-26 DIAGNOSIS — G44029 Chronic cluster headache, not intractable: Secondary | ICD-10-CM

## 2012-01-01 ENCOUNTER — Emergency Department (HOSPITAL_COMMUNITY)
Admission: EM | Admit: 2012-01-01 | Discharge: 2012-01-02 | Disposition: A | Discharge status: Home / Self Care | Payer: Medicare Other | Attending: Emergency Medicine | Admitting: Emergency Medicine

## 2012-01-01 ENCOUNTER — Encounter (HOSPITAL_COMMUNITY): Payer: Self-pay | Admitting: Emergency Medicine

## 2012-01-01 ENCOUNTER — Emergency Department (HOSPITAL_COMMUNITY): Payer: Medicare Other

## 2012-01-01 DIAGNOSIS — M542 Cervicalgia: Secondary | ICD-10-CM | POA: Insufficient documentation

## 2012-01-01 DIAGNOSIS — I10 Essential (primary) hypertension: Secondary | ICD-10-CM | POA: Diagnosis not present

## 2012-01-01 DIAGNOSIS — E7800 Pure hypercholesterolemia, unspecified: Secondary | ICD-10-CM | POA: Insufficient documentation

## 2012-01-01 DIAGNOSIS — M5412 Radiculopathy, cervical region: Secondary | ICD-10-CM | POA: Insufficient documentation

## 2012-01-01 DIAGNOSIS — R51 Headache: Secondary | ICD-10-CM | POA: Insufficient documentation

## 2012-01-01 DIAGNOSIS — M5030 Other cervical disc degeneration, unspecified cervical region: Secondary | ICD-10-CM | POA: Diagnosis not present

## 2012-01-01 DIAGNOSIS — M47812 Spondylosis without myelopathy or radiculopathy, cervical region: Secondary | ICD-10-CM | POA: Diagnosis not present

## 2012-01-01 MED ORDER — HYDROCODONE-ACETAMINOPHEN 5-325 MG PO TABS
1.0000 | ORAL_TABLET | Freq: Once | ORAL | Status: AC
  Administered 2012-01-01: 1 via ORAL
  Filled 2012-01-01: qty 1

## 2012-01-01 NOTE — ED Notes (Signed)
Pt alert, nad, c/o head and neck pain, onset last December, ambulates to triage, steady gait, pain is chronic in nature, denies recent trauma or injury, resp even unlabored, skin pwd
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
## 2012-01-02 MED ORDER — HYDROCODONE-ACETAMINOPHEN 5-325 MG PO TABS: 1.0000 | ORAL_TABLET | Freq: Once | ORAL | Status: AC
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
## 2012-01-02 NOTE — Discharge Instructions (Signed)
Cervical Radiculopathy Cervical radiculopathy happens when a nerve in the neck is pinched or bruised by a slipped (herniated) disk or by arthritic changes in the bones of the cervical spine. This can occur due to an injury or as part of the normal aging process. Pressure on the cervical nerves can cause pain or numbness that runs from your neck all the way down into your arm and fingers. CAUSES  There are many possible causes, including:  Injury.   Muscle tightness in the neck from overuse.   Swollen, painful joints (arthritis).   Breakdown or degeneration in the bones and joints of the spine (spondylosis) due to aging.   Bone spurs that may develop near the cervical nerves.  SYMPTOMS  Symptoms include pain, weakness, or numbness in the affected arm and hand. Pain can be severe or irritating. Symptoms may be worse when extending or turning the neck. DIAGNOSIS  Your caregiver will ask about your symptoms and do a physical exam. He or she may test your strength and reflexes. X-rays, CT scans, and MRI scans may be needed in cases of injury or if the symptoms do not go away after a period of time. Electromyography (EMG) or nerve conduction testing may be done to study how your nerves and muscles are working. TREATMENT  Your caregiver may recommend certain exercises to help relieve your symptoms. Cervical radiculopathy can, and often does, get better with time and treatment. If your problems continue, treatment options may include:  Wearing a soft collar for short periods of time.   Physical therapy to strengthen the neck muscles.   Medicines, such as nonsteroidal anti-inflammatory drugs (NSAIDs), oral corticosteroids, or spinal injections.   Surgery. Different types of surgery may be done depending on the cause of your problems.  HOME CARE INSTRUCTIONS   Put ice on the affected area.   Put ice in a plastic bag.   Place a towel between your skin and the bag.   Leave the ice on for 15  to 20 minutes, 3 to 4 times a day or as directed by your caregiver.   Use a flat pillow when you sleep.   Only take over-the-counter or prescription medicines for pain, discomfort, or fever as directed by your caregiver.   If physical therapy was prescribed, follow your caregiver's directions.   If a soft collar was prescribed, use it as directed.  SEEK IMMEDIATE MEDICAL CARE IF:   Your pain gets much worse and cannot be controlled with medicines.   You have weakness or numbness in your hand, arm, face, or leg.   You have a high fever or a stiff, rigid neck.   You lose bowel or bladder control (incontinence).   You have trouble with walking, balance, or speaking.  MAKE SURE YOU:   Understand these instructions.   Will watch your condition.   Will get help right away if you are not doing well or get worse.  Document Released: 07/10/2001 Document Revised: 10/04/2011 Document Reviewed: 05/29/2011 [employer] Patient Information 2012 [employer]. 

## 2012-01-02 NOTE — ED Provider Notes (Signed)
Medical screening examination/treatment/procedure(s) were performed by non-physician practitioner and as supervising physician I was immediately available for consultation/collaboration.  Nicholes Stairs, MD 01/02/12 2320

## 2012-01-03 ENCOUNTER — Inpatient Hospital Stay (HOSPITAL_COMMUNITY): Admission: RE | Admit: 2012-01-03 | Payer: Medicare Other | Source: Ambulatory Visit

## 2012-01-17 DIAGNOSIS — G8929 Other chronic pain: Secondary | ICD-10-CM | POA: Diagnosis not present

## 2012-01-17 DIAGNOSIS — R52 Pain, unspecified: Secondary | ICD-10-CM | POA: Diagnosis not present

## 2012-01-17 DIAGNOSIS — M542 Cervicalgia: Secondary | ICD-10-CM | POA: Diagnosis not present

## 2012-01-31 DIAGNOSIS — M542 Cervicalgia: Secondary | ICD-10-CM | POA: Diagnosis not present

## 2012-01-31 DIAGNOSIS — I10 Essential (primary) hypertension: Secondary | ICD-10-CM | POA: Diagnosis not present

## 2012-01-31 DIAGNOSIS — F411 Generalized anxiety disorder: Secondary | ICD-10-CM | POA: Diagnosis not present

## 2012-01-31 DIAGNOSIS — M545 Low back pain, unspecified: Secondary | ICD-10-CM | POA: Diagnosis not present

## 2012-02-21 DIAGNOSIS — F329 Major depressive disorder, single episode, unspecified: Secondary | ICD-10-CM | POA: Diagnosis not present

## 2012-02-21 DIAGNOSIS — F339 Major depressive disorder, recurrent, unspecified: Secondary | ICD-10-CM | POA: Diagnosis not present

## 2012-02-21 DIAGNOSIS — F3289 Other specified depressive episodes: Secondary | ICD-10-CM | POA: Diagnosis not present

## 2012-02-29 DIAGNOSIS — I10 Essential (primary) hypertension: Secondary | ICD-10-CM | POA: Diagnosis not present

## 2012-02-29 DIAGNOSIS — M542 Cervicalgia: Secondary | ICD-10-CM | POA: Diagnosis not present

## 2012-02-29 DIAGNOSIS — R031 Nonspecific low blood-pressure reading: Secondary | ICD-10-CM | POA: Diagnosis not present

## 2012-03-06 DIAGNOSIS — F39 Unspecified mood [affective] disorder: Secondary | ICD-10-CM | POA: Diagnosis not present

## 2012-03-06 DIAGNOSIS — F410 Panic disorder [episodic paroxysmal anxiety] without agoraphobia: Secondary | ICD-10-CM | POA: Diagnosis not present

## 2012-04-09 ENCOUNTER — Other Ambulatory Visit: Payer: Self-pay | Admitting: Internal Medicine

## 2012-04-09 DIAGNOSIS — Z1231 Encounter for screening mammogram for malignant neoplasm of breast: Secondary | ICD-10-CM

## 2012-04-23 DIAGNOSIS — E039 Hypothyroidism, unspecified: Secondary | ICD-10-CM | POA: Diagnosis not present

## 2012-04-23 DIAGNOSIS — E0500 Thyrotoxicosis with diffuse goiter without thyrotoxic crisis or storm: Secondary | ICD-10-CM | POA: Diagnosis not present

## 2012-04-23 DIAGNOSIS — I10 Essential (primary) hypertension: Secondary | ICD-10-CM | POA: Diagnosis not present

## 2012-04-23 DIAGNOSIS — E782 Mixed hyperlipidemia: Secondary | ICD-10-CM | POA: Diagnosis not present

## 2012-04-23 DIAGNOSIS — R5381 Other malaise: Secondary | ICD-10-CM | POA: Diagnosis not present

## 2012-04-24 ENCOUNTER — Ambulatory Visit: Payer: Medicare Other

## 2012-04-29 DIAGNOSIS — L0291 Cutaneous abscess, unspecified: Secondary | ICD-10-CM | POA: Diagnosis not present

## 2012-04-29 DIAGNOSIS — M545 Low back pain, unspecified: Secondary | ICD-10-CM | POA: Diagnosis not present

## 2012-04-29 DIAGNOSIS — L0390 Cellulitis, unspecified: Secondary | ICD-10-CM | POA: Diagnosis not present

## 2012-04-29 DIAGNOSIS — L259 Unspecified contact dermatitis, unspecified cause: Secondary | ICD-10-CM | POA: Diagnosis not present

## 2012-04-29 DIAGNOSIS — I10 Essential (primary) hypertension: Secondary | ICD-10-CM | POA: Diagnosis not present

## 2012-05-02 ENCOUNTER — Ambulatory Visit
Admission: RE | Admit: 2012-05-02 | Discharge: 2012-05-02 | Disposition: A | Discharge status: Home / Self Care | Payer: Medicare Other | Source: Ambulatory Visit | Attending: Internal Medicine | Admitting: Internal Medicine

## 2012-05-02 DIAGNOSIS — Z1231 Encounter for screening mammogram for malignant neoplasm of breast: Secondary | ICD-10-CM

## 2012-05-15 DIAGNOSIS — F329 Major depressive disorder, single episode, unspecified: Secondary | ICD-10-CM | POA: Diagnosis not present

## 2012-05-15 DIAGNOSIS — F339 Major depressive disorder, recurrent, unspecified: Secondary | ICD-10-CM | POA: Diagnosis not present

## 2012-05-15 DIAGNOSIS — F3289 Other specified depressive episodes: Secondary | ICD-10-CM | POA: Diagnosis not present

## 2012-06-03 DIAGNOSIS — M545 Low back pain, unspecified: Secondary | ICD-10-CM | POA: Diagnosis not present

## 2012-06-03 DIAGNOSIS — M79609 Pain in unspecified limb: Secondary | ICD-10-CM | POA: Diagnosis not present

## 2012-06-03 DIAGNOSIS — F411 Generalized anxiety disorder: Secondary | ICD-10-CM | POA: Diagnosis not present

## 2012-06-03 DIAGNOSIS — I10 Essential (primary) hypertension: Secondary | ICD-10-CM | POA: Diagnosis not present

## 2012-07-01 DIAGNOSIS — I10 Essential (primary) hypertension: Secondary | ICD-10-CM | POA: Diagnosis not present

## 2012-07-01 DIAGNOSIS — Z90710 Acquired absence of both cervix and uterus: Secondary | ICD-10-CM | POA: Diagnosis not present

## 2012-07-01 DIAGNOSIS — F411 Generalized anxiety disorder: Secondary | ICD-10-CM | POA: Diagnosis not present

## 2012-07-01 DIAGNOSIS — M542 Cervicalgia: Secondary | ICD-10-CM | POA: Diagnosis not present

## 2012-07-01 DIAGNOSIS — M545 Low back pain, unspecified: Secondary | ICD-10-CM | POA: Diagnosis not present

## 2012-07-01 DIAGNOSIS — Z1272 Encounter for screening for malignant neoplasm of vagina: Secondary | ICD-10-CM | POA: Diagnosis not present

## 2012-07-13 DIAGNOSIS — G894 Chronic pain syndrome: Secondary | ICD-10-CM | POA: Diagnosis not present

## 2012-07-13 DIAGNOSIS — M549 Dorsalgia, unspecified: Secondary | ICD-10-CM | POA: Diagnosis not present

## 2012-07-13 DIAGNOSIS — Z79899 Other long term (current) drug therapy: Secondary | ICD-10-CM | POA: Diagnosis not present

## 2012-07-14 DIAGNOSIS — Z23 Encounter for immunization: Secondary | ICD-10-CM | POA: Diagnosis not present

## 2012-07-28 DIAGNOSIS — M79609 Pain in unspecified limb: Secondary | ICD-10-CM | POA: Diagnosis not present

## 2012-07-28 DIAGNOSIS — J45909 Unspecified asthma, uncomplicated: Secondary | ICD-10-CM | POA: Diagnosis not present

## 2012-07-28 DIAGNOSIS — M545 Low back pain, unspecified: Secondary | ICD-10-CM | POA: Diagnosis not present

## 2012-08-12 DIAGNOSIS — F329 Major depressive disorder, single episode, unspecified: Secondary | ICD-10-CM | POA: Diagnosis not present

## 2012-08-12 DIAGNOSIS — F3289 Other specified depressive episodes: Secondary | ICD-10-CM | POA: Diagnosis not present

## 2012-08-12 DIAGNOSIS — F339 Major depressive disorder, recurrent, unspecified: Secondary | ICD-10-CM | POA: Diagnosis not present

## 2012-09-01 DIAGNOSIS — M545 Low back pain, unspecified: Secondary | ICD-10-CM | POA: Diagnosis not present

## 2012-10-02 DIAGNOSIS — M545 Low back pain, unspecified: Secondary | ICD-10-CM | POA: Diagnosis not present

## 2012-10-02 DIAGNOSIS — E7800 Pure hypercholesterolemia, unspecified: Secondary | ICD-10-CM | POA: Diagnosis not present

## 2012-10-02 DIAGNOSIS — I10 Essential (primary) hypertension: Secondary | ICD-10-CM | POA: Diagnosis not present

## 2012-10-02 DIAGNOSIS — F411 Generalized anxiety disorder: Secondary | ICD-10-CM | POA: Diagnosis not present

## 2012-11-13 DIAGNOSIS — F17200 Nicotine dependence, unspecified, uncomplicated: Secondary | ICD-10-CM | POA: Diagnosis not present

## 2012-11-13 DIAGNOSIS — Z23 Encounter for immunization: Secondary | ICD-10-CM | POA: Diagnosis not present

## 2012-11-13 DIAGNOSIS — G8928 Other chronic postprocedural pain: Secondary | ICD-10-CM | POA: Diagnosis not present

## 2012-11-13 DIAGNOSIS — M542 Cervicalgia: Secondary | ICD-10-CM | POA: Diagnosis not present

## 2012-11-18 DIAGNOSIS — M5030 Other cervical disc degeneration, unspecified cervical region: Secondary | ICD-10-CM | POA: Diagnosis not present

## 2012-11-18 DIAGNOSIS — M47812 Spondylosis without myelopathy or radiculopathy, cervical region: Secondary | ICD-10-CM | POA: Diagnosis not present

## 2012-11-21 DIAGNOSIS — E669 Obesity, unspecified: Secondary | ICD-10-CM | POA: Diagnosis not present

## 2012-11-21 DIAGNOSIS — M542 Cervicalgia: Secondary | ICD-10-CM | POA: Diagnosis not present

## 2012-11-21 DIAGNOSIS — Z5181 Encounter for therapeutic drug level monitoring: Secondary | ICD-10-CM | POA: Diagnosis not present

## 2012-11-21 DIAGNOSIS — F17200 Nicotine dependence, unspecified, uncomplicated: Secondary | ICD-10-CM | POA: Diagnosis not present

## 2012-11-21 DIAGNOSIS — Z79899 Other long term (current) drug therapy: Secondary | ICD-10-CM | POA: Diagnosis not present

## 2012-11-21 DIAGNOSIS — G894 Chronic pain syndrome: Secondary | ICD-10-CM | POA: Diagnosis not present

## 2012-12-22 DIAGNOSIS — M542 Cervicalgia: Secondary | ICD-10-CM | POA: Diagnosis not present

## 2012-12-22 DIAGNOSIS — Z79899 Other long term (current) drug therapy: Secondary | ICD-10-CM | POA: Diagnosis not present

## 2012-12-22 DIAGNOSIS — F17200 Nicotine dependence, unspecified, uncomplicated: Secondary | ICD-10-CM | POA: Diagnosis not present

## 2012-12-22 DIAGNOSIS — G894 Chronic pain syndrome: Secondary | ICD-10-CM | POA: Diagnosis not present

## 2013-01-05 DIAGNOSIS — M47812 Spondylosis without myelopathy or radiculopathy, cervical region: Secondary | ICD-10-CM | POA: Diagnosis not present

## 2013-01-16 ENCOUNTER — Other Ambulatory Visit: Payer: Self-pay | Admitting: Neurological Surgery

## 2013-01-16 DIAGNOSIS — M47812 Spondylosis without myelopathy or radiculopathy, cervical region: Secondary | ICD-10-CM

## 2013-01-22 ENCOUNTER — Ambulatory Visit
Admission: RE | Admit: 2013-01-22 | Discharge: 2013-01-22 | Disposition: A | Discharge status: Home / Self Care | Payer: Medicare Other | Source: Ambulatory Visit | Attending: Neurological Surgery | Admitting: Neurological Surgery

## 2013-01-22 DIAGNOSIS — M47812 Spondylosis without myelopathy or radiculopathy, cervical region: Secondary | ICD-10-CM | POA: Diagnosis not present

## 2013-01-22 DIAGNOSIS — M4310 Spondylolisthesis, site unspecified: Secondary | ICD-10-CM | POA: Diagnosis not present

## 2013-01-22 DIAGNOSIS — M4802 Spinal stenosis, cervical region: Secondary | ICD-10-CM | POA: Diagnosis not present

## 2013-01-27 ENCOUNTER — Other Ambulatory Visit: Payer: Self-pay | Admitting: Neurological Surgery

## 2013-01-27 DIAGNOSIS — M47812 Spondylosis without myelopathy or radiculopathy, cervical region: Secondary | ICD-10-CM | POA: Diagnosis not present

## 2013-01-27 DIAGNOSIS — M5412 Radiculopathy, cervical region: Secondary | ICD-10-CM | POA: Diagnosis not present

## 2013-01-30 DIAGNOSIS — Z79899 Other long term (current) drug therapy: Secondary | ICD-10-CM | POA: Diagnosis not present

## 2013-01-30 DIAGNOSIS — G894 Chronic pain syndrome: Secondary | ICD-10-CM | POA: Diagnosis not present

## 2013-01-30 DIAGNOSIS — M542 Cervicalgia: Secondary | ICD-10-CM | POA: Diagnosis not present

## 2013-02-17 ENCOUNTER — Encounter (HOSPITAL_COMMUNITY): Payer: Self-pay | Admitting: Pharmacy Technician

## 2013-02-19 ENCOUNTER — Encounter (HOSPITAL_COMMUNITY): Payer: Self-pay

## 2013-02-19 ENCOUNTER — Ambulatory Visit (HOSPITAL_COMMUNITY)
Admission: RE | Admit: 2013-02-19 | Discharge: 2013-02-19 | Disposition: A | Discharge status: Home / Self Care | Payer: Medicare Other | Source: Ambulatory Visit | Attending: Neurological Surgery | Admitting: Neurological Surgery

## 2013-02-19 ENCOUNTER — Encounter (HOSPITAL_COMMUNITY)
Admission: RE | Admit: 2013-02-19 | Discharge: 2013-02-19 | Disposition: A | Discharge status: Home / Self Care | Payer: Medicare Other | Source: Ambulatory Visit | Attending: Neurological Surgery | Admitting: Neurological Surgery

## 2013-02-19 DIAGNOSIS — F17200 Nicotine dependence, unspecified, uncomplicated: Secondary | ICD-10-CM | POA: Insufficient documentation

## 2013-02-19 DIAGNOSIS — J449 Chronic obstructive pulmonary disease, unspecified: Secondary | ICD-10-CM | POA: Insufficient documentation

## 2013-02-19 DIAGNOSIS — Z01818 Encounter for other preprocedural examination: Secondary | ICD-10-CM | POA: Insufficient documentation

## 2013-02-19 DIAGNOSIS — J4489 Other specified chronic obstructive pulmonary disease: Secondary | ICD-10-CM | POA: Insufficient documentation

## 2013-02-19 DIAGNOSIS — J45909 Unspecified asthma, uncomplicated: Secondary | ICD-10-CM | POA: Diagnosis not present

## 2013-02-19 HISTORY — DX: Major depressive disorder, single episode, unspecified: F32.9

## 2013-02-19 HISTORY — DX: Hypothyroidism, unspecified: E03.9

## 2013-02-19 HISTORY — DX: Depression, unspecified: F32.A

## 2013-02-19 HISTORY — DX: Fibromyalgia: M79.7

## 2013-02-19 HISTORY — DX: Unspecified osteoarthritis, unspecified site: M19.90

## 2013-02-19 HISTORY — DX: Cardiac murmur, unspecified: R01.1

## 2013-02-19 LAB — CBC WITH DIFFERENTIAL/PLATELET
Basophils Relative: 0 % (ref 0–1)
Eosinophils Absolute: 0.4 10*3/uL (ref 0.0–0.7)
Lymphs Abs: 2.6 10*3/uL (ref 0.7–4.0)
MCH: 30.2 pg (ref 26.0–34.0)
Neutro Abs: 5.2 10*3/uL (ref 1.7–7.7)
Neutrophils Relative %: 59 % (ref 43–77)
Platelets: 328 10*3/uL (ref 150–400)
RBC: 4.74 MIL/uL (ref 3.87–5.11)

## 2013-02-19 LAB — PROTIME-INR
INR: 0.94 (ref 0.00–1.49)
Prothrombin Time: 12.5 seconds (ref 11.6–15.2)

## 2013-02-19 LAB — BASIC METABOLIC PANEL
Calcium: 9.7 mg/dL (ref 8.4–10.5)
GFR calc Af Amer: >90 mL/min (ref >90)
GFR calc non Af Amer: >90 mL/min (ref >90)
Glucose, Bld: 102 mg/dL — ABNORMAL HIGH (ref 70–99)
Potassium: 3.4 mEq/L — ABNORMAL LOW (ref 3.5–5.1)
Sodium: 140 mEq/L (ref 135–145)

## 2013-02-19 LAB — SURGICAL PCR SCREEN
MRSA, PCR: NEGATIVE
Staphylococcus aureus: NEGATIVE

## 2013-02-19 NOTE — Pre-Procedure Instructions (Signed)
Clarice Wiater  02/19/2013   Your procedure is scheduled on:  Thursday, May 1st.  Report [facility] at 5:30AM.  Call this number if you have problems the morning of surgery: [phone]   Remember:   Do not eat food or drink liquids after midnight.    Take these medicines the morning of surgery with A SIP OF WATER:  estradiol (ESTRACE levothyroxine (SYNTHROID, LEVOTHROID)  montelukast (SINGULAIR) venlafaxine [facility])   May take if needed: HYDROcodone-acetaminophen (NORCO) methocarbamol (ROBAXIN)    Do not wear jewelry, make-up or nail polish.  Do not wear lotions, powders, or perfumes. You may wear deodorant.  Do not shave 48 hours prior to surgery.   Do not bring valuables to the hospital.  Contacts, dentures or bridgework may not be worn into surgery.  Leave suitcase in the car. After surgery it may be brought to your room.  For patients admitted to the hospital, checkout time is 11:00 AM the day of  discharge.   Patients discharged the day of surgery will not be allowed to drive  home.  Name and phone number of your driver: -   Special Instructions: Shower using CHG 2 nights before surgery and the night before surgery.  If you shower the day of surgery use CHG.  Use special wash - you have one bottle of CHG for all showers.  You should use approximately 1/3 of the bottle for each shower.   Please read over the following fact sheets that you were given: Pain Booklet, Coughing and Deep Breathing and Surgical Site Infection Prevention

## 2013-02-19 NOTE — Pre-Procedure Instructions (Signed)
Shaniah [facility]  02/19/2013   Your procedure is scheduled on:  Friday  02/27/13   Report to [facility] at 530 AM.  Call this number if you have problems the morning of surgery: [phone]   Remember:   Do not eat food or drink liquids after midnight.   Take these medicines the morning of surgery with A SIP OF WATER:  ESTRACE, HYDROCODONE, SYNTHROID, SINGULAIR, EFFEXOR   Do not wear jewelry, make-up or nail polish.  Do not wear lotions, powders, or perfumes. You may wear deodorant.  Do not shave 48 hours prior to surgery. Men may shave face and neck.  Do not bring valuables to the hospital.  Contacts, dentures or bridgework may not be worn into surgery.  Leave suitcase in the car. After surgery it may be brought to your room.  For patients admitted to the hospital, checkout time is 11:00 AM the day of  discharge.   Patients discharged the day of surgery will not be allowed to drive  home.  Name and phone number of your driver:   Special Instructions: Shower using CHG 2 nights before surgery and the night before surgery.  If you shower the day of surgery use CHG.  Use special wash - you have one bottle of CHG for all showers.  You should use approximately 1/3 of the bottle for each shower.   Please read over the following fact sheets that you were given: Pain Booklet, Coughing and Deep Breathing, MRSA Information and Surgical Site Infection Prevention

## 2013-02-26 MED ORDER — CEFAZOLIN SODIUM-DEXTROSE 2-3 GM-% IV SOLR
2.0000 g | INTRAVENOUS | Status: DC
  Filled 2013-02-26: qty 50

## 2013-02-26 MED ORDER — DEXAMETHASONE SODIUM PHOSPHATE 10 MG/ML IJ SOLN
10.0000 mg | INTRAMUSCULAR | Status: AC
  Administered 2013-02-27: 10 mg via INTRAVENOUS
  Filled 2013-02-26: qty 1

## 2013-02-27 ENCOUNTER — Encounter (HOSPITAL_COMMUNITY)
Admission: RE | Disposition: A | Discharge status: Home / Self Care | Payer: Self-pay | Source: Ambulatory Visit | Attending: Neurological Surgery

## 2013-02-27 ENCOUNTER — Inpatient Hospital Stay (HOSPITAL_COMMUNITY)
Admission: RE | Admit: 2013-02-27 | Discharge: 2013-02-28 | DRG: 473 | Disposition: A | Discharge status: Home / Self Care | Payer: Medicare Other | Source: Ambulatory Visit | Attending: Neurological Surgery | Admitting: Neurological Surgery

## 2013-02-27 ENCOUNTER — Inpatient Hospital Stay (HOSPITAL_COMMUNITY): Payer: Medicare Other

## 2013-02-27 ENCOUNTER — Encounter (HOSPITAL_COMMUNITY): Payer: Self-pay | Admitting: Surgery

## 2013-02-27 ENCOUNTER — Inpatient Hospital Stay (HOSPITAL_COMMUNITY): Payer: Medicare Other | Admitting: Anesthesiology

## 2013-02-27 ENCOUNTER — Encounter (HOSPITAL_COMMUNITY): Payer: Self-pay | Admitting: Anesthesiology

## 2013-02-27 DIAGNOSIS — E7800 Pure hypercholesterolemia, unspecified: Secondary | ICD-10-CM | POA: Diagnosis not present

## 2013-02-27 DIAGNOSIS — J449 Chronic obstructive pulmonary disease, unspecified: Secondary | ICD-10-CM | POA: Diagnosis present

## 2013-02-27 DIAGNOSIS — J4489 Other specified chronic obstructive pulmonary disease: Secondary | ICD-10-CM | POA: Diagnosis present

## 2013-02-27 DIAGNOSIS — I10 Essential (primary) hypertension: Secondary | ICD-10-CM | POA: Diagnosis present

## 2013-02-27 DIAGNOSIS — M5020 Other cervical disc displacement, unspecified cervical region: Secondary | ICD-10-CM | POA: Diagnosis present

## 2013-02-27 DIAGNOSIS — E039 Hypothyroidism, unspecified: Secondary | ICD-10-CM | POA: Diagnosis present

## 2013-02-27 DIAGNOSIS — M5412 Radiculopathy, cervical region: Secondary | ICD-10-CM | POA: Diagnosis not present

## 2013-02-27 DIAGNOSIS — Z79899 Other long term (current) drug therapy: Secondary | ICD-10-CM | POA: Diagnosis not present

## 2013-02-27 DIAGNOSIS — F329 Major depressive disorder, single episode, unspecified: Secondary | ICD-10-CM | POA: Diagnosis present

## 2013-02-27 DIAGNOSIS — Z888 Allergy status to other drugs, medicaments and biological substances status: Secondary | ICD-10-CM

## 2013-02-27 DIAGNOSIS — F3289 Other specified depressive episodes: Secondary | ICD-10-CM | POA: Diagnosis present

## 2013-02-27 DIAGNOSIS — F17200 Nicotine dependence, unspecified, uncomplicated: Secondary | ICD-10-CM | POA: Diagnosis present

## 2013-02-27 DIAGNOSIS — M47812 Spondylosis without myelopathy or radiculopathy, cervical region: Secondary | ICD-10-CM | POA: Diagnosis not present

## 2013-02-27 DIAGNOSIS — IMO0001 Reserved for inherently not codable concepts without codable children: Secondary | ICD-10-CM | POA: Diagnosis present

## 2013-02-27 DIAGNOSIS — Z833 Family history of diabetes mellitus: Secondary | ICD-10-CM | POA: Diagnosis not present

## 2013-02-27 HISTORY — PX: ANTERIOR CERVICAL DECOMP/DISCECTOMY FUSION: SHX1161

## 2013-02-27 SURGERY — ANTERIOR CERVICAL DECOMPRESSION/DISCECTOMY FUSION 3 LEVELS
Anesthesia: General | Site: Spine Cervical | Wound class: Clean

## 2013-02-27 MED ORDER — ONDANSETRON HCL 4 MG/2ML IJ SOLN: 4.0000 mg | INTRAMUSCULAR | Status: DC | PRN

## 2013-02-27 MED ORDER — OXYCODONE HCL 5 MG/5ML PO SOLN: 5.0000 mg | Freq: Once | ORAL | Status: AC | PRN

## 2013-02-27 MED ORDER — OXYCODONE HCL 5 MG PO TABS
10.0000 mg | ORAL_TABLET | ORAL | Status: DC | PRN
  Administered 2013-02-27 – 2013-02-28 (×4): 10 mg via ORAL
  Filled 2013-02-27 (×4): qty 2

## 2013-02-27 MED ORDER — HYDROCHLOROTHIAZIDE 25 MG PO TABS
25.0000 mg | ORAL_TABLET | Freq: Every day | ORAL | Status: DC
  Administered 2013-02-28: 25 mg via ORAL
  Filled 2013-02-27 (×2): qty 1

## 2013-02-27 MED ORDER — MORPHINE SULFATE 2 MG/ML IJ SOLN
1.0000 mg | INTRAMUSCULAR | Status: DC | PRN
  Administered 2013-02-27 – 2013-02-28 (×4): 4 mg via INTRAVENOUS
  Filled 2013-02-27 (×4): qty 2

## 2013-02-27 MED ORDER — DEXAMETHASONE SODIUM PHOSPHATE 4 MG/ML IJ SOLN
4.0000 mg | Freq: Four times a day (QID) | INTRAMUSCULAR | Status: DC
  Filled 2013-02-27 (×4): qty 1

## 2013-02-27 MED ORDER — ACETAMINOPHEN 325 MG PO TABS: 650.0000 mg | ORAL_TABLET | ORAL | Status: DC | PRN

## 2013-02-27 MED ORDER — HYDROMORPHONE HCL PF 1 MG/ML IJ SOLN
0.2500 mg | INTRAMUSCULAR | Status: DC | PRN
  Administered 2013-02-27 (×2): 0.5 mg via INTRAVENOUS

## 2013-02-27 MED ORDER — MENTHOL 3 MG MT LOZG: 1.0000 | LOZENGE | OROMUCOSAL | Status: DC | PRN

## 2013-02-27 MED ORDER — SODIUM CHLORIDE 0.9 % IV SOLN
INTRAVENOUS | Status: AC
  Filled 2013-02-27: qty 500

## 2013-02-27 MED ORDER — ESTRADIOL 1 MG PO TABS
1.0000 mg | ORAL_TABLET | Freq: Every day | ORAL | Status: DC
  Administered 2013-02-28: 1 mg via ORAL
  Filled 2013-02-27 (×2): qty 1

## 2013-02-27 MED ORDER — SODIUM CHLORIDE 0.9 % IV SOLN: 250.0000 mL | INTRAVENOUS | Status: DC

## 2013-02-27 MED ORDER — LIDOCAINE HCL (CARDIAC) 20 MG/ML IV SOLN
INTRAVENOUS | Status: DC | PRN
  Administered 2013-02-27: 100 mg via INTRAVENOUS

## 2013-02-27 MED ORDER — SENNA 8.6 MG PO TABS
1.0000 | ORAL_TABLET | Freq: Two times a day (BID) | ORAL | Status: DC
  Administered 2013-02-27 – 2013-02-28 (×3): 8.6 mg via ORAL
  Filled 2013-02-27 (×4): qty 1

## 2013-02-27 MED ORDER — ROCURONIUM BROMIDE 100 MG/10ML IV SOLN
INTRAVENOUS | Status: DC | PRN
  Administered 2013-02-27: 50 mg via INTRAVENOUS
  Administered 2013-02-27 (×3): 10 mg via INTRAVENOUS

## 2013-02-27 MED ORDER — THROMBIN 5000 UNITS EX SOLR
OROMUCOSAL | Status: DC | PRN
  Administered 2013-02-27: 08:00:00 via TOPICAL

## 2013-02-27 MED ORDER — OXYCODONE HCL 5 MG PO TABS
ORAL_TABLET | ORAL | Status: AC
  Filled 2013-02-27: qty 1

## 2013-02-27 MED ORDER — PHENOL 1.4 % MT LIQD: 1.0000 | OROMUCOSAL | Status: DC | PRN

## 2013-02-27 MED ORDER — ONDANSETRON HCL 4 MG/2ML IJ SOLN: 4.0000 mg | Freq: Once | INTRAMUSCULAR | Status: DC | PRN

## 2013-02-27 MED ORDER — LACTATED RINGERS IV SOLN
INTRAVENOUS | Status: DC | PRN
  Administered 2013-02-27 (×2): via INTRAVENOUS

## 2013-02-27 MED ORDER — DEXAMETHASONE 4 MG PO TABS
4.0000 mg | ORAL_TABLET | Freq: Four times a day (QID) | ORAL | Status: DC
  Administered 2013-02-27 – 2013-02-28 (×4): 4 mg via ORAL
  Filled 2013-02-27 (×8): qty 1

## 2013-02-27 MED ORDER — ACETAMINOPHEN 10 MG/ML IV SOLN
INTRAVENOUS | Status: AC
  Administered 2013-02-27: 1000 mg via INTRAVENOUS
  Filled 2013-02-27: qty 100

## 2013-02-27 MED ORDER — ALBUTEROL SULFATE HFA 108 (90 BASE) MCG/ACT IN AERS
2.0000 | INHALATION_SPRAY | RESPIRATORY_TRACT | Status: DC | PRN
  Filled 2013-02-27: qty 6.7

## 2013-02-27 MED ORDER — METHOCARBAMOL 500 MG PO TABS
500.0000 mg | ORAL_TABLET | Freq: Four times a day (QID) | ORAL | Status: DC | PRN
  Administered 2013-02-27 – 2013-02-28 (×3): 500 mg via ORAL
  Filled 2013-02-27 (×3): qty 1

## 2013-02-27 MED ORDER — ONDANSETRON HCL 4 MG/2ML IJ SOLN
INTRAMUSCULAR | Status: DC | PRN
  Administered 2013-02-27: 4 mg via INTRAVENOUS

## 2013-02-27 MED ORDER — 0.9 % SODIUM CHLORIDE (POUR BTL) OPTIME
TOPICAL | Status: DC | PRN
  Administered 2013-02-27: 1000 mL

## 2013-02-27 MED ORDER — ZOLPIDEM TARTRATE 5 MG PO TABS: 5.0000 mg | ORAL_TABLET | Freq: Every evening | ORAL | Status: DC | PRN

## 2013-02-27 MED ORDER — ACETAMINOPHEN 10 MG/ML IV SOLN
1000.0000 mg | Freq: Four times a day (QID) | INTRAVENOUS | Status: AC
  Administered 2013-02-27 – 2013-02-28 (×4): 1000 mg via INTRAVENOUS
  Filled 2013-02-27 (×5): qty 100

## 2013-02-27 MED ORDER — OXYCODONE HCL 5 MG PO TABS
5.0000 mg | ORAL_TABLET | Freq: Once | ORAL | Status: AC | PRN
  Administered 2013-02-27: 5 mg via ORAL

## 2013-02-27 MED ORDER — SODIUM CHLORIDE 0.9 % IJ SOLN: 3.0000 mL | INTRAMUSCULAR | Status: DC | PRN

## 2013-02-27 MED ORDER — NEOSTIGMINE METHYLSULFATE 1 MG/ML IJ SOLN
INTRAMUSCULAR | Status: DC | PRN
  Administered 2013-02-27: 5 mg via INTRAVENOUS

## 2013-02-27 MED ORDER — SODIUM CHLORIDE 0.9 % IJ SOLN
3.0000 mL | Freq: Two times a day (BID) | INTRAMUSCULAR | Status: DC
  Administered 2013-02-28: 3 mL via INTRAVENOUS

## 2013-02-27 MED ORDER — GLYCOPYRROLATE 0.2 MG/ML IJ SOLN
INTRAMUSCULAR | Status: DC | PRN
  Administered 2013-02-27: 0.6 mg via INTRAVENOUS

## 2013-02-27 MED ORDER — MEPERIDINE HCL 25 MG/ML IJ SOLN: 6.2500 mg | INTRAMUSCULAR | Status: DC | PRN

## 2013-02-27 MED ORDER — HYDROMORPHONE HCL PF 1 MG/ML IJ SOLN
INTRAMUSCULAR | Status: AC
  Filled 2013-02-27: qty 1

## 2013-02-27 MED ORDER — MONTELUKAST SODIUM 10 MG PO TABS
10.0000 mg | ORAL_TABLET | Freq: Every day | ORAL | Status: DC
  Administered 2013-02-28: 10 mg via ORAL
  Filled 2013-02-27 (×2): qty 1

## 2013-02-27 MED ORDER — LEVOTHYROXINE SODIUM 112 MCG PO TABS
112.0000 ug | ORAL_TABLET | Freq: Every day | ORAL | Status: DC
  Administered 2013-02-28: 112 ug via ORAL
  Filled 2013-02-27 (×3): qty 1

## 2013-02-27 MED ORDER — SODIUM CHLORIDE 0.9 % IR SOLN
Status: DC | PRN
  Administered 2013-02-27: 08:00:00

## 2013-02-27 MED ORDER — ARTIFICIAL TEARS OP OINT
TOPICAL_OINTMENT | OPHTHALMIC | Status: DC | PRN
  Administered 2013-02-27: 1 via OPHTHALMIC

## 2013-02-27 MED ORDER — METHOCARBAMOL 100 MG/ML IJ SOLN: 500.0000 mg | Freq: Four times a day (QID) | INTRAVENOUS | Status: DC | PRN

## 2013-02-27 MED ORDER — POTASSIUM CHLORIDE IN NACL 20-0.9 MEQ/L-% IV SOLN
INTRAVENOUS | Status: DC
  Filled 2013-02-27 (×3): qty 1000

## 2013-02-27 MED ORDER — THROMBIN 20000 UNITS EX SOLR
CUTANEOUS | Status: DC | PRN
  Administered 2013-02-27: 08:00:00 via TOPICAL

## 2013-02-27 MED ORDER — CEFAZOLIN SODIUM 1-5 GM-% IV SOLN
1.0000 g | Freq: Three times a day (TID) | INTRAVENOUS | Status: AC
  Administered 2013-02-27 (×2): 1 g via INTRAVENOUS
  Filled 2013-02-27 (×2): qty 50

## 2013-02-27 MED ORDER — BUPIVACAINE HCL (PF) 0.25 % IJ SOLN
INTRAMUSCULAR | Status: DC | PRN
  Administered 2013-02-27: 5 mL

## 2013-02-27 MED ORDER — PROPOFOL 10 MG/ML IV BOLUS
INTRAVENOUS | Status: DC | PRN
  Administered 2013-02-27: 50 mg via INTRAVENOUS
  Administered 2013-02-27: 150 mg via INTRAVENOUS
  Administered 2013-02-27 (×2): 50 mg via INTRAVENOUS

## 2013-02-27 MED ORDER — MIDAZOLAM HCL 5 MG/5ML IJ SOLN
INTRAMUSCULAR | Status: DC | PRN
  Administered 2013-02-27 (×2): 2 mg via INTRAVENOUS

## 2013-02-27 MED ORDER — BACITRACIN 50000 UNITS IM SOLR
INTRAMUSCULAR | Status: AC
  Filled 2013-02-27: qty 1

## 2013-02-27 MED ORDER — FENTANYL CITRATE 0.05 MG/ML IJ SOLN
INTRAMUSCULAR | Status: DC | PRN
  Administered 2013-02-27: 100 ug via INTRAVENOUS
  Administered 2013-02-27: 50 ug via INTRAVENOUS
  Administered 2013-02-27: 100 ug via INTRAVENOUS
  Administered 2013-02-27: 50 ug via INTRAVENOUS
  Administered 2013-02-27 (×2): 100 ug via INTRAVENOUS
  Administered 2013-02-27 (×2): 50 ug via INTRAVENOUS

## 2013-02-27 MED ORDER — VENLAFAXINE HCL ER 150 MG PO CP24
300.0000 mg | ORAL_CAPSULE | Freq: Every day | ORAL | Status: DC
Start: 2013-02-27 — End: 2013-02-28
  Administered 2013-02-28: 300 mg via ORAL
  Filled 2013-02-27 (×2): qty 2

## 2013-02-27 MED ORDER — ACETAMINOPHEN 650 MG RE SUPP: 650.0000 mg | RECTAL | Status: DC | PRN

## 2013-02-27 SURGICAL SUPPLY — 57 items
ALLOGRAFT LORDOTIC 8X11X14 (Bone Implant) ×1 IMPLANT
ALLOGRAFT LORDOTIC CC 7X11X14 (Bone Implant) ×1 IMPLANT
ALLOGRAFT TRIAD LORDOTIC CC (Bone Implant) ×1 IMPLANT
APL SKNCLS STERI-STRIP NONHPOA (GAUZE/BANDAGES/DRESSINGS) ×1
BAG DECANTER FOR FLEXI CONT (MISCELLANEOUS) ×2 IMPLANT
BENZOIN TINCTURE PRP APPL 2/3 (GAUZE/BANDAGES/DRESSINGS) ×2 IMPLANT
BIT DRILL POWER (BIT) IMPLANT
BUR MATCHSTICK NEURO 3.0 LAGG (BURR) ×2 IMPLANT
CANISTER SUCTION 2500CC (MISCELLANEOUS) ×2 IMPLANT
CLOTH BEACON ORANGE TIMEOUT ST (SAFETY) ×2 IMPLANT
CONT SPEC 4OZ CLIKSEAL STRL BL (MISCELLANEOUS) ×2 IMPLANT
DRAIN SNY WOU 7FLT (WOUND CARE) ×1 IMPLANT
DRAPE C-ARM 42X72 X-RAY (DRAPES) ×4 IMPLANT
DRAPE LAPAROTOMY 100X72 PEDS (DRAPES) ×2 IMPLANT
DRAPE MICROSCOPE LEICA (MISCELLANEOUS) ×1 IMPLANT
DRAPE MICROSCOPE ZEISS OPMI (DRAPES) ×1 IMPLANT
DRAPE POUCH INSTRU U-SHP 10X18 (DRAPES) ×2 IMPLANT
DRESSING TELFA 8X3 (GAUZE/BANDAGES/DRESSINGS) ×2 IMPLANT
DRILL BIT POWER (BIT) ×2
DRSG OPSITE 4X5.5 SM (GAUZE/BANDAGES/DRESSINGS) ×2 IMPLANT
DURAPREP 6ML APPLICATOR 50/CS (WOUND CARE) ×2 IMPLANT
ELECT COATED BLADE 2.86 ST (ELECTRODE) ×2 IMPLANT
ELECT REM PT RETURN 9FT ADLT (ELECTROSURGICAL) ×2
ELECTRODE REM PT RTRN 9FT ADLT (ELECTROSURGICAL) ×1 IMPLANT
GAUZE SPONGE 4X4 16PLY XRAY LF (GAUZE/BANDAGES/DRESSINGS) IMPLANT
GLOVE BIO SURGEON STRL SZ8.5 (GLOVE) ×1 IMPLANT
GLOVE BIOGEL M 8.0 STRL (GLOVE) ×2 IMPLANT
GLOVE BIOGEL PI IND STRL 8 (GLOVE) IMPLANT
GLOVE BIOGEL PI INDICATOR 8 (GLOVE) ×2
GLOVE ECLIPSE 7.5 STRL STRAW (GLOVE) ×3 IMPLANT
GLOVE INDICATOR 8.5 STRL (GLOVE) ×1 IMPLANT
GOWN BRE IMP SLV AUR LG STRL (GOWN DISPOSABLE) IMPLANT
GOWN BRE IMP SLV AUR XL STRL (GOWN DISPOSABLE) ×3 IMPLANT
GOWN STRL REIN 2XL LVL4 (GOWN DISPOSABLE) ×1 IMPLANT
HEAD HALTER (SOFTGOODS) IMPLANT
HEMOSTAT POWDER KIT SURGIFOAM (HEMOSTASIS) ×2 IMPLANT
KIT BASIN OR (CUSTOM PROCEDURE TRAY) ×2 IMPLANT
KIT ROOM TURNOVER OR (KITS) ×2 IMPLANT
NDL HYPO 25X1 1.5 SAFETY (NEEDLE) ×1 IMPLANT
NDL SPNL 20GX3.5 QUINCKE YW (NEEDLE) ×1 IMPLANT
NEEDLE HYPO 25X1 1.5 SAFETY (NEEDLE) ×2 IMPLANT
NEEDLE SPNL 20GX3.5 QUINCKE YW (NEEDLE) ×2 IMPLANT
NS IRRIG 1000ML POUR BTL (IV SOLUTION) ×2 IMPLANT
PACK LAMINECTOMY NEURO (CUSTOM PROCEDURE TRAY) ×2 IMPLANT
PAD ARMBOARD 7.5X6 YLW CONV (MISCELLANEOUS) ×4 IMPLANT
PLATE HELIX T 58MM (Plate) ×1 IMPLANT
RUBBERBAND STERILE (MISCELLANEOUS) ×4 IMPLANT
SCREW FIXED SELF TAP 4.0X13MM (Screw) ×8 IMPLANT
SPONGE INTESTINAL PEANUT (DISPOSABLE) ×2 IMPLANT
SPONGE SURGIFOAM ABS GEL 100 (HEMOSTASIS) ×2 IMPLANT
STRIP CLOSURE SKIN 1/2X4 (GAUZE/BANDAGES/DRESSINGS) ×2 IMPLANT
SUT VIC AB 3-0 SH 8-18 (SUTURE) ×3 IMPLANT
SYR 20ML ECCENTRIC (SYRINGE) ×1 IMPLANT
TOWEL OR 17X24 6PK STRL BLUE (TOWEL DISPOSABLE) ×2 IMPLANT
TOWEL OR 17X26 10 PK STRL BLUE (TOWEL DISPOSABLE) ×2 IMPLANT
TRAP SPECIMEN MUCOUS 40CC (MISCELLANEOUS) IMPLANT
WATER STERILE IRR 1000ML POUR (IV SOLUTION) ×2 IMPLANT

## 2013-02-27 NOTE — Anesthesia Procedure Notes (Signed)
Procedure Name: Intubation Date/Time: 02/27/2013 7:40 AM Performed by: Sherie Don Pre-anesthesia Checklist: Patient identified, Emergency Drugs available, Suction available, Patient being monitored and Timeout performed Patient Re-evaluated:Patient Re-evaluated prior to inductionOxygen Delivery Method: Circle system utilized Preoxygenation: Pre-oxygenation with 100% oxygen Intubation Type: IV induction and Cricoid Pressure applied Ventilation: Mask ventilation without difficulty Laryngoscope Size: Mac and 3 Grade View: Grade III Tube type: Oral Number of attempts: 2 (cricoid pressure applied for visualization) Airway Equipment and Method: Stylet Placement Confirmation: ETT inserted through vocal cords under direct vision and breath sounds checked- equal and bilateral Secured at: 22 cm Tube secured with: Tape Dental Injury: Teeth and Oropharynx as per pre-operative assessment

## 2013-02-27 NOTE — Anesthesia Postprocedure Evaluation (Signed)
Anesthesia Post Note  Patient: Geneticist, molecular  Procedure(s) Performed: Procedure(s) (LRB): ANTERIOR CERVICAL DECOMPRESSION/DISCECTOMY FUSION 3 LEVELS (N/A)  Anesthesia type: general  Patient location: PACU  Post pain: Pain level controlled  Post assessment: Patient's Cardiovascular Status Stable  Last Vitals:  Filed Vitals:   02/27/13 1219  BP: 126/86  Pulse: 84  Temp: 37.1 C  Resp: 16    Post vital signs: Reviewed and stable  Level of consciousness: sedated  Complications: No apparent anesthesia complications

## 2013-02-27 NOTE — H&P (Signed)
Subjective:   Patient is a 52 y.o. female admitted for ACDF C4-5,C5-6, C6-7. The patient first presented to me with complaints of neck pain. Onset of symptoms was many months ago. The pain is described as aching and sharp and occurs all day. The pain is rated severe and is located at the base of the neck and radiates to the shoulder The symptoms have been progressive. Symptoms are exacerbated by neck movement, and are relieved by meds.  Previous work up includes MRI which shows spondylosis.  Past Medical History  Diagnosis Date  . Asthma   . Hypertension   . High blood cholesterol level   . Heart murmur     ASYMPTOMATIC  . Hypothyroidism   . Depression   . COPD (chronic obstructive pulmonary disease)   . Arthritis   . Fibromyalgia     Past Surgical History  Procedure Laterality Date  . Hysterctomy    . Abdominal hysterectomy    . Tonsillectomy    . Cesarean section    . Abdominal surgery    . Hernia repair      X3     Allergies  Allergen Reactions  . Codeine Itching and Nausea And Vomiting  . Propoxyphene Hcl Nausea And Vomiting and Other (See Comments)    hallucinations  . Tramadol Nausea And Vomiting    History  Substance Use Topics  . Smoking status: Current Every Day Smoker -- 1.00 packs/day  . Smokeless tobacco: Not on file  . Alcohol Use: Yes     Comment: ocassionally    Family History  Problem Relation Age of Onset  . Cancer Mother   . Diabetes Father    Prior to Admission medications   Medication Sig Start Date End Date Taking? Authorizing Provider  albuterol (PROVENTIL HFA;VENTOLIN HFA) 108 (90 BASE) MCG/ACT inhaler Inhale 2 puffs into the lungs as needed for wheezing or shortness of breath.   Yes Historical Provider, MD  atorvastatin (LIPITOR) 10 MG tablet Take 10 mg by mouth daily.     Yes Historical Provider, MD  atorvastatin (LIPITOR) 10 MG tablet Take 10 mg by mouth daily.   Yes Historical Provider, MD  estradiol (ESTRACE) 1 MG tablet Take 1 mg by  mouth daily.     Yes Historical Provider, MD  hydrochlorothiazide (HYDRODIURIL) 25 MG tablet Take 25 mg by mouth daily.     Yes Historical Provider, MD  HYDROcodone-acetaminophen (NORCO) 7.5-325 MG per tablet Take 2 tablets by mouth every 6 (six) hours as needed for pain.   Yes Historical Provider, MD  levothyroxine (SYNTHROID, LEVOTHROID) 112 MCG tablet Take 112 mcg by mouth daily.     Yes Historical Provider, MD  methocarbamol (ROBAXIN) 750 MG tablet Take 750 mg by mouth 3 (three) times daily as needed (muscle spasms).   Yes Historical Provider, MD  montelukast (SINGULAIR) 10 MG tablet Take 10 mg by mouth daily.    Yes Historical Provider, MD  naproxen (NAPROSYN) 500 MG tablet Take 500 mg by mouth 3 (three) times daily as needed (pain).   Yes Historical Provider, MD  venlafaxine (EFFEXOR-XR) 150 MG 24 hr capsule Take 300 mg by mouth daily.   Yes Historical Provider, MD     Review of Systems  Positive ROS: neg  All other systems have been reviewed and were otherwise negative with the exception of those mentioned in the HPI and as above.  Objective: Vital signs in last 24 hours:    General Appearance: Alert, cooperative, no distress, appears stated  age Head: Normocephalic, without obvious abnormality, atraumatic Eyes: PERRL, conjunctiva/corneas clear, EOM's intact Neck: Supple, symmetrical, trachea midline Lungs: Clear to auscultation bilaterally, respirations unlabored Heart: Regular rate and rhythm Abdomen: Soft, non-tender Extremities: Extremities normal, atraumatic, no cyanosis or edema Pulses: 2+ and symmetric all extremities Skin: Skin color, texture, turgor normal, no rashes or lesions  NEUROLOGIC:  Mental status: Alert and oriented x4, no aphasia, good attention span, fund of knowledge and memory  Motor Exam - grossly normal Sensory Exam - grossly normal Reflexes: 1+ Coordination - grossly normal Gait - grossly normal Balance - grossly normal Cranial Nerves: I: smell  Not tested  II: visual acuity  OS: nl    OD: nl  II: visual fields Full to confrontation  II: pupils Equal, round, reactive to light  III,VII: ptosis None  III,IV,VI: extraocular muscles  Full ROM  V: mastication Normal  V: facial light touch sensation  Normal  V,VII: corneal reflex  Present  VII: facial muscle function - upper  Normal  VII: facial muscle function - lower Normal  VIII: hearing Not tested  IX: soft palate elevation  Normal  IX,X: gag reflex Present  XI: trapezius strength  5/5  XI: sternocleidomastoid strength 5/5  XI: neck flexion strength  5/5  XII: tongue strength  Normal    Data Review Lab Results  Component Value Date   WBC 9.0 02/19/2013   HGB 14.3 02/19/2013   HCT 41.2 02/19/2013   MCV 86.9 02/19/2013   PLT 328 02/19/2013   Lab Results  Component Value Date   NA 140 02/19/2013   K 3.4* 02/19/2013   CL 101 02/19/2013   CO2 32 02/19/2013   BUN 9 02/19/2013   CREATININE 0.74 02/19/2013   GLUCOSE 102* 02/19/2013   Lab Results  Component Value Date   INR 0.94 02/19/2013    Assessment:   Cervical neck pain with herniated nucleus pulposus/ spondylosis/ stenosis at C4-C7. Patient has failed conservative therapy. Planned surgery : ACDF C4-5, C5-6, C6-7  Plan:   I explained the condition and procedure to the patient and answered any questions.  Patient wishes to proceed with procedure as planned. Understands risks/ benefits/ and expected or typical outcomes.  Jill Robertson S 02/27/2013 6:02 AM

## 2013-02-27 NOTE — Preoperative (Signed)
Beta Blockers   Reason not to administer Beta Blockers:Not Applicable 

## 2013-02-27 NOTE — Transfer of Care (Signed)
Immediate Anesthesia Transfer of Care Note  Patient: [employer]  Procedure(s) Performed: Procedure(s) with comments: ANTERIOR CERVICAL DECOMPRESSION/DISCECTOMY FUSION 3 LEVELS (N/A) - Cervical Four-Five,Cervical Five-Six,Cervical Six-Seven   Patient Location: PACU  Anesthesia Type:General  Level of Consciousness: awake and patient cooperative  Airway & Oxygen Therapy: Patient Spontanous Breathing and Patient connected to face mask oxygen  Post-op Assessment: Report given to PACU RN, Post -op Vital signs reviewed and stable and Patient moving all extremities X 4  Post vital signs: Reviewed and stable  Complications: No apparent anesthesia complications

## 2013-02-27 NOTE — Anesthesia Preprocedure Evaluation (Signed)
Anesthesia Evaluation  Patient identified by MRN, date of birth, ID band Patient awake    Reviewed: Allergy & Precautions, H&P , NPO status , Patient's Chart, lab work & pertinent test results  Airway Mallampati: I TM Distance: >3 FB Neck ROM: Full    Dental   Pulmonary COPD         Cardiovascular hypertension, Pt. on medications + Valvular Problems/Murmurs     Neuro/Psych    GI/Hepatic   Endo/Other  Hypothyroidism   Renal/GU      Musculoskeletal   Abdominal   Peds  Hematology   Anesthesia Other Findings   Reproductive/Obstetrics                           Anesthesia Physical Anesthesia Plan  ASA: II  Anesthesia Plan: General   Post-op Pain Management:    Induction: Intravenous  Airway Management Planned: Oral ETT  Additional Equipment:   Intra-op Plan:   Post-operative Plan: Extubation in OR  Informed Consent: I have reviewed the patients History and Physical, chart, labs and discussed the procedure including the risks, benefits and alternatives for the proposed anesthesia with the patient or authorized representative who has indicated his/her understanding and acceptance.     Plan Discussed with: CRNA and Surgeon  Anesthesia Plan Comments:         Anesthesia Quick Evaluation

## 2013-02-27 NOTE — Op Note (Signed)
02/27/2013  10:16 AM  PATIENT:  Jill Robertson  52 y.o. 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 Decompressive anterior cervical discectomy C4-5, C5-6, and C6-7, 2. Anterior cervical arthrodesis C4-5, C5-6, C6-7, utilizing cortico-cancellus allografts, 3. Posterior cervical plating C4-C7 utilizing a Nuvasive translational plate  SURGEON:  Marikay Alar, MD  ASSISTANTS: Dr. Wynetta Emery  ANESTHESIA:   General  EBL: 75 ml  Total I/O In: 1400 [I.V.:1400] Out: 75 [Blood:75]  BLOOD ADMINISTERED:none  DRAINS: 7 flat JP   SPECIMEN:  No Specimen  INDICATION FOR PROCEDURE: This patient presented with a long history of neck pain with radiation into her left shoulder. She tried medical management quite some time does relief. MRI showed spondylosis from C4-C7. I recommended an anterior cervical discectomy and fusion with plating from C4-C7. Patient understood the risks, benefits, and alternatives and potential outcomes and wished to proceed.  PROCEDURE DETAILS: Patient was brought to the operating room placed under general endotracheal anesthesia. Patient was placed in the supine position on the operating room table. The neck was prepped with Duraprep and draped in a sterile fashion.   Three cc of local anesthesia was injected and a transverse incision was made on the right side of the neck.  Dissection was carried down thru the subcutaneous tissue and the platysma was  elevated, opened, and undermined with Metzenbaum scissors.  Dissection was then carried out thru an avascular plane leaving the sternocleidomastoid carotid artery and jugular vein laterally and the trachea and esophagus medially. The ventral aspect of the vertebral column was identified and a localizing x-ray was taken. The C4-5 level was identified. The longus colli muscles were then elevated from C4-C7 and the retractor was placed.  The anterior osteophytes were removed with a Leksell rongeur at each disc space. The annulus was incised and the disc space entered at each level. Discectomy was performed with micro-curettes and pituitary rongeurs. I then used the high-speed drill to drill the endplates down to the level of the posterior longitudinal ligament at each level. The operating microscope was draped and brought into the field provided additional magnification, illumination and visualization. Discectomy was continued posteriorly thru the disc space at each level. Posterior longitudinal ligament was opened with a nerve hook, and then removed along with disc herniation and osteophytes, decompressing the spinal canal and thecal sac at each level. We then continued to remove osteophytic overgrowth and disc material decompressing the neural foramina and exiting nerve roots bilaterally. Particular attention was paid to the left side because of her left-sided symptoms. The scope was angled up and down to help decompress and undercut the vertebral bodies. Once the decompression was completed we could pass a nerve hook circumferentially to assure adequate decompression in the midline and in the neural foramina. So by both visualization and palpation we felt we had an adequate decompression of the neural elements. We then measured the height of the intravertebral disc space and selected corresponding cortical cancellus allografts. It was then gently positioned in the intravertebral disc space and countersunk at each level. I then used a 58 mm translational plate and placed fixed angle screws into the vertebral bodies from C4-C7 inclusive and locked them into position. The wound was irrigated with bacitracin solution, checked for hemostasis which was established and confirmed. Once meticulous hemostasis was achieved, a 7 flat JP drain was placed and we then proceeded with closure. The platysma was closed  with interrupted 3-0 undyed Vicryl suture, the  subcuticular layer was closed with interrupted 3-0 undyed Vicryl suture. The skin edges were approximated with steristrips. The drapes were removed. A sterile dressing was applied. The patient was then awakened from general anesthesia and transferred to the recovery room in stable condition. At the end of the procedure all sponge, needle and instrument counts were correct.   PLAN OF CARE: Admit to inpatient   PATIENT DISPOSITION:  PACU - hemodynamically stable.   Delay start of Pharmacological VTE agent (>24hrs) due to surgical blood loss or risk of bleeding:  yes

## 2013-02-28 MED ORDER — OXYCODONE HCL 10 MG PO TABS: 10.0000 mg | ORAL_TABLET | ORAL | Status: DC | PRN

## 2013-02-28 NOTE — Discharge Summary (Signed)
  Physician Discharge Summary  Patient ID: Jill Robertson MRN: [id number] DOB/AGE: 10/30/1960 52 y.o.  Admit date: 02/27/2013 Discharge date: 02/28/2013  Admission Diagnoses: Cervical spondylosis with stenosis  Discharge Diagnoses: Same Active Problems:   * No active hospital problems. *   Discharged Condition: good  Hospital Course: Patient is to the hospital underwent a three-level ACDF postop patient did very well recovered in the floor on the floor she was convalescing well ambulating and voiding spontaneously tolerating regular diet wound is clean and dry she stable and be discharged home posterior day 1.  Consults: Significant Diagnostic Studies: Treatments: ACDF Discharge Exam: Blood pressure 114/77, pulse 90, temperature 97.2 F (36.2 C), temperature source Oral, resp. rate 16, SpO2 96.00%. Strength out of 5 wound flat clean and dry  Disposition: Home     Medication List    TAKE these medications       albuterol 108 (90 BASE) MCG/ACT inhaler  Commonly known as:  PROVENTIL HFA;VENTOLIN HFA  Inhale 2 puffs into the lungs as needed for wheezing or shortness of breath.     atorvastatin 10 MG tablet  Commonly known as:  LIPITOR  Take 10 mg by mouth daily.     atorvastatin 10 MG tablet  Commonly known as:  LIPITOR  Take 10 mg by mouth daily.     estradiol 1 MG tablet  Commonly known as:  ESTRACE  Take 1 mg by mouth daily.     hydrochlorothiazide 25 MG tablet  Commonly known as:  HYDRODIURIL  Take 25 mg by mouth daily.     HYDROcodone-acetaminophen 7.5-325 MG per tablet  Commonly known as:  NORCO  Take 2 tablets by mouth every 6 (six) hours as needed for pain.     levothyroxine 112 MCG tablet  Commonly known as:  SYNTHROID, LEVOTHROID  Take 112 mcg by mouth daily.     methocarbamol 750 MG tablet  Commonly known as:  ROBAXIN  Take 750 mg by mouth 3 (three) times daily as needed (muscle spasms).     montelukast 10 MG tablet  Commonly known as:   SINGULAIR  Take 10 mg by mouth daily.     naproxen 500 MG tablet  Commonly known as:  NAPROSYN  Take 500 mg by mouth 3 (three) times daily as needed (pain).     Oxycodone HCl 10 MG Tabs  Take 1 tablet (10 mg total) by mouth every 4 (four) hours as needed.     venlafaxine XR 150 MG 24 hr capsule  Commonly known as:  EFFEXOR-XR  Take 300 mg by mouth daily.           Follow-up Information   Follow up with Tia Alert, MD.   Contact information:   [address] [place] [address] [phone]       Signed: Jeremi Losito P 02/28/2013, 8:03 AM

## 2013-02-28 NOTE — Progress Notes (Signed)
Subjective: Patient reports Doing well no arm pain very minimal back pain and minimal swelling difficulty  Objective: Vital signs in last 24 hours: Temp:  [97.2 F (36.2 C)-99.9 F (37.7 C)] 97.2 F (36.2 C) (05/03 0430) Pulse Rate:  [77-101] 90 (05/03 0430) Resp:  [12-26] 16 (05/03 0430) BP: (112-151)/(68-88) 114/77 mmHg (05/03 0430) SpO2:  [93 %-100 %] 96 % (05/03 0430)  Intake/Output from previous day: 05/02 0701 - 05/03 0700 In: 2680 [P.O.:1080; I.V.:1600] Out: 167 [Drains:92; Blood:75] Intake/Output this shift:    1 flat strength out of 5  Lab Results: No results found for this basename: WBC, HGB, HCT, PLT,  in the last 72 hours BMET No results found for this basename: NA, K, CL, CO2, GLUCOSE, BUN, CREATININE, CALCIUM,  in the last 72 hours  Studies/Results: Dg Cervical Spine 2-3 Views  02/27/2013  *RADIOLOGY REPORT*  Clinical Data:  ACDF C4-C7.  DG C-ARM 1-60 MIN,CERVICAL SPINE - 2-3 VIEW  Fluoroscopy Time: Technique:  C-arm fluoroscopic images were obtained intraoperatively and submitted for postoperative interpretation.  Please see the performing provider's procedural report for the fluoroscopy time utilized.  Comparison: None.  Findings: Changes of ACDF noted reportedly from C4-C7.  Unable to visualize below the C5 level due to overlying shoulders.  IMPRESSION: C4-C7 ACDF.  Cannot visualize below C5 due to overlying shoulders.   Original Report Authenticated By: Charlett Nose, M.D.    Dg C-arm 1-60 Min  02/27/2013  *RADIOLOGY REPORT*  Clinical Data:  ACDF C4-C7.  DG C-ARM 1-60 MIN,CERVICAL SPINE - 2-3 VIEW  Fluoroscopy Time: Technique:  C-arm fluoroscopic images were obtained intraoperatively and submitted for postoperative interpretation.  Please see the performing provider's procedural report for the fluoroscopy time utilized.  Comparison: None.  Findings: Changes of ACDF noted reportedly from C4-C7.  Unable to visualize below the C5 level due to overlying shoulders.   IMPRESSION: C4-C7 ACDF.  Cannot visualize below C5 due to overlying shoulders.   Original Report Authenticated By: Charlett Nose, M.D.     Assessment/Plan: Discharge home  LOS: 1 day     Tonia Avino P 02/28/2013, 8:01 AM

## 2013-02-28 NOTE — Progress Notes (Signed)
Pt. Alert and oriented, follows simple instructions, denies pain. Incision area without swelling, redness or S/S of infection. Voiding adequate clear yellow urine. Moving all extremities well and vitals stable and documented. Anterior Cervical Fusion surgery notes instructions given to patient and family member for home safety and precautions. Pt. and family stated understanding of instructions given. Pain meds given per Pt.'s request for pain and discomfort of ride home 

## 2013-03-02 ENCOUNTER — Encounter (HOSPITAL_COMMUNITY): Payer: Self-pay | Admitting: Neurological Surgery

## 2013-03-09 DIAGNOSIS — M542 Cervicalgia: Secondary | ICD-10-CM | POA: Diagnosis not present

## 2013-04-04 DIAGNOSIS — IMO0002 Reserved for concepts with insufficient information to code with codable children: Secondary | ICD-10-CM | POA: Diagnosis present

## 2013-04-04 DIAGNOSIS — E876 Hypokalemia: Secondary | ICD-10-CM | POA: Diagnosis not present

## 2013-04-04 DIAGNOSIS — R109 Unspecified abdominal pain: Secondary | ICD-10-CM | POA: Diagnosis not present

## 2013-04-04 DIAGNOSIS — Z0389 Encounter for observation for other suspected diseases and conditions ruled out: Secondary | ICD-10-CM | POA: Diagnosis not present

## 2013-04-04 DIAGNOSIS — N959 Unspecified menopausal and perimenopausal disorder: Secondary | ICD-10-CM | POA: Diagnosis present

## 2013-04-04 DIAGNOSIS — E039 Hypothyroidism, unspecified: Secondary | ICD-10-CM | POA: Diagnosis present

## 2013-04-04 DIAGNOSIS — F3289 Other specified depressive episodes: Secondary | ICD-10-CM | POA: Diagnosis present

## 2013-04-04 DIAGNOSIS — Q792 Exomphalos: Secondary | ICD-10-CM | POA: Diagnosis not present

## 2013-04-04 DIAGNOSIS — I10 Essential (primary) hypertension: Secondary | ICD-10-CM | POA: Diagnosis not present

## 2013-04-04 DIAGNOSIS — R1013 Epigastric pain: Secondary | ICD-10-CM | POA: Diagnosis not present

## 2013-04-04 DIAGNOSIS — Z79899 Other long term (current) drug therapy: Secondary | ICD-10-CM | POA: Diagnosis not present

## 2013-04-04 DIAGNOSIS — K8062 Calculus of gallbladder and bile duct with acute cholecystitis without obstruction: Secondary | ICD-10-CM | POA: Diagnosis not present

## 2013-04-04 DIAGNOSIS — R112 Nausea with vomiting, unspecified: Secondary | ICD-10-CM | POA: Diagnosis not present

## 2013-04-04 DIAGNOSIS — L608 Other nail disorders: Secondary | ICD-10-CM | POA: Diagnosis present

## 2013-04-04 DIAGNOSIS — K810 Acute cholecystitis: Secondary | ICD-10-CM | POA: Diagnosis not present

## 2013-04-04 DIAGNOSIS — K819 Cholecystitis, unspecified: Secondary | ICD-10-CM | POA: Diagnosis not present

## 2013-04-04 DIAGNOSIS — K859 Acute pancreatitis without necrosis or infection, unspecified: Secondary | ICD-10-CM | POA: Diagnosis not present

## 2013-04-04 DIAGNOSIS — F17200 Nicotine dependence, unspecified, uncomplicated: Secondary | ICD-10-CM | POA: Diagnosis not present

## 2013-04-04 DIAGNOSIS — R933 Abnormal findings on diagnostic imaging of other parts of digestive tract: Secondary | ICD-10-CM | POA: Diagnosis not present

## 2013-04-04 DIAGNOSIS — F329 Major depressive disorder, single episode, unspecified: Secondary | ICD-10-CM | POA: Diagnosis present

## 2013-04-04 DIAGNOSIS — K824 Cholesterolosis of gallbladder: Secondary | ICD-10-CM | POA: Diagnosis not present

## 2013-04-04 DIAGNOSIS — R1084 Generalized abdominal pain: Secondary | ICD-10-CM | POA: Diagnosis not present

## 2013-04-04 DIAGNOSIS — K8042 Calculus of bile duct with acute cholecystitis without obstruction: Secondary | ICD-10-CM | POA: Diagnosis not present

## 2013-04-04 DIAGNOSIS — K811 Chronic cholecystitis: Secondary | ICD-10-CM | POA: Diagnosis not present

## 2013-04-04 DIAGNOSIS — J309 Allergic rhinitis, unspecified: Secondary | ICD-10-CM | POA: Diagnosis not present

## 2013-04-04 DIAGNOSIS — J449 Chronic obstructive pulmonary disease, unspecified: Secondary | ICD-10-CM | POA: Diagnosis present

## 2013-04-04 DIAGNOSIS — K209 Esophagitis, unspecified without bleeding: Secondary | ICD-10-CM | POA: Diagnosis not present

## 2013-04-04 DIAGNOSIS — R011 Cardiac murmur, unspecified: Secondary | ICD-10-CM | POA: Diagnosis not present

## 2013-04-04 DIAGNOSIS — E860 Dehydration: Secondary | ICD-10-CM | POA: Diagnosis not present

## 2013-04-28 DIAGNOSIS — M542 Cervicalgia: Secondary | ICD-10-CM | POA: Diagnosis not present

## 2013-06-20 DIAGNOSIS — K047 Periapical abscess without sinus: Secondary | ICD-10-CM | POA: Diagnosis not present

## 2013-06-20 DIAGNOSIS — K05219 Aggressive periodontitis, localized, unspecified severity: Secondary | ICD-10-CM | POA: Diagnosis not present

## 2013-06-20 DIAGNOSIS — K089 Disorder of teeth and supporting structures, unspecified: Secondary | ICD-10-CM | POA: Diagnosis not present

## 2013-07-03 DIAGNOSIS — F17200 Nicotine dependence, unspecified, uncomplicated: Secondary | ICD-10-CM | POA: Diagnosis not present

## 2013-07-03 DIAGNOSIS — E039 Hypothyroidism, unspecified: Secondary | ICD-10-CM | POA: Diagnosis not present

## 2013-07-03 DIAGNOSIS — F411 Generalized anxiety disorder: Secondary | ICD-10-CM | POA: Diagnosis not present

## 2013-07-03 DIAGNOSIS — G4700 Insomnia, unspecified: Secondary | ICD-10-CM | POA: Diagnosis not present

## 2013-07-03 DIAGNOSIS — I10 Essential (primary) hypertension: Secondary | ICD-10-CM | POA: Diagnosis not present

## 2013-07-03 DIAGNOSIS — E785 Hyperlipidemia, unspecified: Secondary | ICD-10-CM | POA: Diagnosis not present

## 2013-07-03 DIAGNOSIS — Z23 Encounter for immunization: Secondary | ICD-10-CM | POA: Diagnosis not present

## 2013-08-05 DIAGNOSIS — G4700 Insomnia, unspecified: Secondary | ICD-10-CM | POA: Diagnosis not present

## 2013-08-05 DIAGNOSIS — I10 Essential (primary) hypertension: Secondary | ICD-10-CM | POA: Diagnosis not present

## 2013-08-05 DIAGNOSIS — E785 Hyperlipidemia, unspecified: Secondary | ICD-10-CM | POA: Diagnosis not present

## 2013-08-05 DIAGNOSIS — J449 Chronic obstructive pulmonary disease, unspecified: Secondary | ICD-10-CM | POA: Diagnosis not present

## 2013-08-18 DIAGNOSIS — M542 Cervicalgia: Secondary | ICD-10-CM | POA: Diagnosis not present

## 2013-09-03 ENCOUNTER — Other Ambulatory Visit: Payer: Self-pay

## 2013-10-05 DIAGNOSIS — I10 Essential (primary) hypertension: Secondary | ICD-10-CM | POA: Diagnosis not present

## 2013-10-05 DIAGNOSIS — E785 Hyperlipidemia, unspecified: Secondary | ICD-10-CM | POA: Diagnosis not present

## 2013-10-05 DIAGNOSIS — J449 Chronic obstructive pulmonary disease, unspecified: Secondary | ICD-10-CM | POA: Diagnosis not present

## 2013-11-17 DIAGNOSIS — M542 Cervicalgia: Secondary | ICD-10-CM | POA: Diagnosis not present

## 2013-11-17 DIAGNOSIS — E669 Obesity, unspecified: Secondary | ICD-10-CM | POA: Diagnosis not present

## 2013-12-08 DIAGNOSIS — M25519 Pain in unspecified shoulder: Secondary | ICD-10-CM | POA: Diagnosis not present

## 2013-12-08 DIAGNOSIS — M62838 Other muscle spasm: Secondary | ICD-10-CM | POA: Diagnosis not present

## 2013-12-08 DIAGNOSIS — IMO0001 Reserved for inherently not codable concepts without codable children: Secondary | ICD-10-CM | POA: Diagnosis not present

## 2013-12-08 DIAGNOSIS — M542 Cervicalgia: Secondary | ICD-10-CM | POA: Diagnosis not present

## 2014-01-05 DIAGNOSIS — E039 Hypothyroidism, unspecified: Secondary | ICD-10-CM | POA: Diagnosis not present

## 2014-01-05 DIAGNOSIS — I10 Essential (primary) hypertension: Secondary | ICD-10-CM | POA: Diagnosis not present

## 2014-01-05 DIAGNOSIS — E785 Hyperlipidemia, unspecified: Secondary | ICD-10-CM | POA: Diagnosis not present

## 2014-01-13 DIAGNOSIS — J449 Chronic obstructive pulmonary disease, unspecified: Secondary | ICD-10-CM | POA: Diagnosis not present

## 2014-01-13 DIAGNOSIS — J218 Acute bronchiolitis due to other specified organisms: Secondary | ICD-10-CM | POA: Diagnosis not present

## 2014-01-13 DIAGNOSIS — J069 Acute upper respiratory infection, unspecified: Secondary | ICD-10-CM | POA: Diagnosis not present

## 2014-01-24 DIAGNOSIS — M25579 Pain in unspecified ankle and joints of unspecified foot: Secondary | ICD-10-CM | POA: Diagnosis not present

## 2014-01-24 DIAGNOSIS — M25569 Pain in unspecified knee: Secondary | ICD-10-CM | POA: Diagnosis not present

## 2014-01-26 DIAGNOSIS — J449 Chronic obstructive pulmonary disease, unspecified: Secondary | ICD-10-CM | POA: Diagnosis not present

## 2014-01-26 DIAGNOSIS — M25579 Pain in unspecified ankle and joints of unspecified foot: Secondary | ICD-10-CM | POA: Diagnosis not present

## 2014-01-26 DIAGNOSIS — F329 Major depressive disorder, single episode, unspecified: Secondary | ICD-10-CM | POA: Diagnosis not present

## 2014-01-26 DIAGNOSIS — F3289 Other specified depressive episodes: Secondary | ICD-10-CM | POA: Diagnosis not present

## 2014-01-26 DIAGNOSIS — Z79899 Other long term (current) drug therapy: Secondary | ICD-10-CM | POA: Diagnosis not present

## 2014-01-26 DIAGNOSIS — M129 Arthropathy, unspecified: Secondary | ICD-10-CM | POA: Diagnosis not present

## 2014-01-26 DIAGNOSIS — M25569 Pain in unspecified knee: Secondary | ICD-10-CM | POA: Diagnosis not present

## 2014-01-26 DIAGNOSIS — M2550 Pain in unspecified joint: Secondary | ICD-10-CM | POA: Diagnosis not present

## 2014-01-26 DIAGNOSIS — F17200 Nicotine dependence, unspecified, uncomplicated: Secondary | ICD-10-CM | POA: Diagnosis not present

## 2014-01-26 DIAGNOSIS — M25559 Pain in unspecified hip: Secondary | ICD-10-CM | POA: Diagnosis not present

## 2014-01-26 DIAGNOSIS — I10 Essential (primary) hypertension: Secondary | ICD-10-CM | POA: Diagnosis not present

## 2014-01-26 DIAGNOSIS — R5381 Other malaise: Secondary | ICD-10-CM | POA: Diagnosis not present

## 2014-01-26 DIAGNOSIS — E039 Hypothyroidism, unspecified: Secondary | ICD-10-CM | POA: Diagnosis not present

## 2014-01-30 DIAGNOSIS — K591 Functional diarrhea: Secondary | ICD-10-CM | POA: Diagnosis not present

## 2014-01-30 DIAGNOSIS — B370 Candidal stomatitis: Secondary | ICD-10-CM | POA: Diagnosis not present

## 2014-02-01 DIAGNOSIS — M62838 Other muscle spasm: Secondary | ICD-10-CM | POA: Diagnosis not present

## 2014-02-01 DIAGNOSIS — IMO0001 Reserved for inherently not codable concepts without codable children: Secondary | ICD-10-CM | POA: Diagnosis not present

## 2014-02-01 DIAGNOSIS — M542 Cervicalgia: Secondary | ICD-10-CM | POA: Diagnosis not present

## 2014-02-01 DIAGNOSIS — M25519 Pain in unspecified shoulder: Secondary | ICD-10-CM | POA: Diagnosis not present

## 2014-02-10 DIAGNOSIS — Z79899 Other long term (current) drug therapy: Secondary | ICD-10-CM | POA: Diagnosis not present

## 2014-02-10 DIAGNOSIS — G894 Chronic pain syndrome: Secondary | ICD-10-CM | POA: Diagnosis not present

## 2014-02-10 DIAGNOSIS — M549 Dorsalgia, unspecified: Secondary | ICD-10-CM | POA: Diagnosis not present

## 2014-02-10 DIAGNOSIS — B370 Candidal stomatitis: Secondary | ICD-10-CM | POA: Diagnosis not present

## 2014-02-12 DIAGNOSIS — H5210 Myopia, unspecified eye: Secondary | ICD-10-CM | POA: Diagnosis not present

## 2014-02-12 DIAGNOSIS — H02409 Unspecified ptosis of unspecified eyelid: Secondary | ICD-10-CM | POA: Diagnosis not present

## 2014-02-12 DIAGNOSIS — H524 Presbyopia: Secondary | ICD-10-CM | POA: Diagnosis not present

## 2014-03-05 DIAGNOSIS — M545 Low back pain, unspecified: Secondary | ICD-10-CM | POA: Diagnosis not present

## 2014-03-05 DIAGNOSIS — J449 Chronic obstructive pulmonary disease, unspecified: Secondary | ICD-10-CM | POA: Diagnosis not present

## 2014-03-05 DIAGNOSIS — M549 Dorsalgia, unspecified: Secondary | ICD-10-CM | POA: Diagnosis not present

## 2014-03-10 DIAGNOSIS — I10 Essential (primary) hypertension: Secondary | ICD-10-CM | POA: Diagnosis not present

## 2014-03-10 DIAGNOSIS — G894 Chronic pain syndrome: Secondary | ICD-10-CM | POA: Diagnosis not present

## 2014-04-14 DIAGNOSIS — I10 Essential (primary) hypertension: Secondary | ICD-10-CM | POA: Diagnosis not present

## 2014-04-14 DIAGNOSIS — G894 Chronic pain syndrome: Secondary | ICD-10-CM | POA: Diagnosis not present

## 2014-04-21 DIAGNOSIS — I10 Essential (primary) hypertension: Secondary | ICD-10-CM | POA: Diagnosis not present

## 2014-04-26 DIAGNOSIS — IMO0001 Reserved for inherently not codable concepts without codable children: Secondary | ICD-10-CM | POA: Diagnosis not present

## 2014-04-26 DIAGNOSIS — M545 Low back pain, unspecified: Secondary | ICD-10-CM | POA: Diagnosis not present

## 2014-04-26 DIAGNOSIS — M62838 Other muscle spasm: Secondary | ICD-10-CM | POA: Diagnosis not present

## 2014-04-26 DIAGNOSIS — M542 Cervicalgia: Secondary | ICD-10-CM | POA: Diagnosis not present

## 2014-05-03 DIAGNOSIS — Z6832 Body mass index (BMI) 32.0-32.9, adult: Secondary | ICD-10-CM | POA: Diagnosis not present

## 2014-05-03 DIAGNOSIS — M545 Low back pain, unspecified: Secondary | ICD-10-CM | POA: Diagnosis not present

## 2014-05-03 DIAGNOSIS — M542 Cervicalgia: Secondary | ICD-10-CM | POA: Diagnosis not present

## 2014-05-05 DIAGNOSIS — M545 Low back pain, unspecified: Secondary | ICD-10-CM | POA: Diagnosis not present

## 2014-05-10 DIAGNOSIS — IMO0002 Reserved for concepts with insufficient information to code with codable children: Secondary | ICD-10-CM | POA: Diagnosis not present

## 2014-05-10 DIAGNOSIS — R937 Abnormal findings on diagnostic imaging of other parts of musculoskeletal system: Secondary | ICD-10-CM | POA: Diagnosis not present

## 2014-05-10 DIAGNOSIS — M1710 Unilateral primary osteoarthritis, unspecified knee: Secondary | ICD-10-CM | POA: Diagnosis not present

## 2014-05-10 DIAGNOSIS — M25569 Pain in unspecified knee: Secondary | ICD-10-CM | POA: Diagnosis not present

## 2014-05-12 DIAGNOSIS — Z79899 Other long term (current) drug therapy: Secondary | ICD-10-CM | POA: Diagnosis not present

## 2014-05-12 DIAGNOSIS — E0590 Thyrotoxicosis, unspecified without thyrotoxic crisis or storm: Secondary | ICD-10-CM | POA: Diagnosis not present

## 2014-05-12 DIAGNOSIS — E785 Hyperlipidemia, unspecified: Secondary | ICD-10-CM | POA: Diagnosis not present

## 2014-05-12 DIAGNOSIS — M549 Dorsalgia, unspecified: Secondary | ICD-10-CM | POA: Diagnosis not present

## 2014-05-12 DIAGNOSIS — G894 Chronic pain syndrome: Secondary | ICD-10-CM | POA: Diagnosis not present

## 2014-05-31 DIAGNOSIS — B351 Tinea unguium: Secondary | ICD-10-CM | POA: Diagnosis not present

## 2014-05-31 DIAGNOSIS — B370 Candidal stomatitis: Secondary | ICD-10-CM | POA: Diagnosis not present

## 2014-06-14 DIAGNOSIS — E785 Hyperlipidemia, unspecified: Secondary | ICD-10-CM | POA: Diagnosis not present

## 2014-06-14 DIAGNOSIS — G894 Chronic pain syndrome: Secondary | ICD-10-CM | POA: Diagnosis not present

## 2014-06-14 DIAGNOSIS — M549 Dorsalgia, unspecified: Secondary | ICD-10-CM | POA: Diagnosis not present

## 2014-06-14 DIAGNOSIS — I10 Essential (primary) hypertension: Secondary | ICD-10-CM | POA: Diagnosis not present

## 2014-06-21 DIAGNOSIS — M25559 Pain in unspecified hip: Secondary | ICD-10-CM | POA: Diagnosis not present

## 2014-06-23 DIAGNOSIS — K5900 Constipation, unspecified: Secondary | ICD-10-CM | POA: Diagnosis not present

## 2014-06-29 DIAGNOSIS — J449 Chronic obstructive pulmonary disease, unspecified: Secondary | ICD-10-CM | POA: Diagnosis not present

## 2014-06-29 DIAGNOSIS — F329 Major depressive disorder, single episode, unspecified: Secondary | ICD-10-CM | POA: Diagnosis not present

## 2014-06-29 DIAGNOSIS — J020 Streptococcal pharyngitis: Secondary | ICD-10-CM | POA: Diagnosis not present

## 2014-06-29 DIAGNOSIS — F3289 Other specified depressive episodes: Secondary | ICD-10-CM | POA: Diagnosis not present

## 2014-07-16 DIAGNOSIS — J209 Acute bronchitis, unspecified: Secondary | ICD-10-CM | POA: Diagnosis not present

## 2014-07-16 DIAGNOSIS — J449 Chronic obstructive pulmonary disease, unspecified: Secondary | ICD-10-CM | POA: Diagnosis not present

## 2014-07-21 DIAGNOSIS — F17200 Nicotine dependence, unspecified, uncomplicated: Secondary | ICD-10-CM | POA: Diagnosis not present

## 2014-07-21 DIAGNOSIS — J209 Acute bronchitis, unspecified: Secondary | ICD-10-CM | POA: Diagnosis not present

## 2014-07-21 DIAGNOSIS — J449 Chronic obstructive pulmonary disease, unspecified: Secondary | ICD-10-CM | POA: Diagnosis not present

## 2014-07-27 DIAGNOSIS — J309 Allergic rhinitis, unspecified: Secondary | ICD-10-CM | POA: Diagnosis not present

## 2014-07-27 DIAGNOSIS — R059 Cough, unspecified: Secondary | ICD-10-CM | POA: Diagnosis not present

## 2014-07-27 DIAGNOSIS — R5383 Other fatigue: Secondary | ICD-10-CM | POA: Diagnosis not present

## 2014-07-27 DIAGNOSIS — R05 Cough: Secondary | ICD-10-CM | POA: Diagnosis not present

## 2014-07-27 DIAGNOSIS — J45909 Unspecified asthma, uncomplicated: Secondary | ICD-10-CM | POA: Diagnosis not present

## 2014-07-27 DIAGNOSIS — R5381 Other malaise: Secondary | ICD-10-CM | POA: Diagnosis not present

## 2014-08-09 DIAGNOSIS — J309 Allergic rhinitis, unspecified: Secondary | ICD-10-CM | POA: Diagnosis not present

## 2014-08-10 DIAGNOSIS — R61 Generalized hyperhidrosis: Secondary | ICD-10-CM | POA: Diagnosis not present

## 2014-08-10 DIAGNOSIS — R05 Cough: Secondary | ICD-10-CM | POA: Diagnosis not present

## 2014-08-17 DIAGNOSIS — J45909 Unspecified asthma, uncomplicated: Secondary | ICD-10-CM | POA: Diagnosis not present

## 2014-08-17 DIAGNOSIS — R5383 Other fatigue: Secondary | ICD-10-CM | POA: Diagnosis not present

## 2014-08-17 DIAGNOSIS — J4530 Mild persistent asthma, uncomplicated: Secondary | ICD-10-CM | POA: Diagnosis not present

## 2014-08-17 DIAGNOSIS — E559 Vitamin D deficiency, unspecified: Secondary | ICD-10-CM | POA: Diagnosis not present

## 2014-08-17 DIAGNOSIS — D803 Selective deficiency of immunoglobulin G [IgG] subclasses: Secondary | ICD-10-CM | POA: Diagnosis not present

## 2014-08-17 DIAGNOSIS — J309 Allergic rhinitis, unspecified: Secondary | ICD-10-CM | POA: Diagnosis not present

## 2014-08-17 DIAGNOSIS — Z23 Encounter for immunization: Secondary | ICD-10-CM | POA: Diagnosis not present

## 2014-09-01 DIAGNOSIS — Z23 Encounter for immunization: Secondary | ICD-10-CM | POA: Diagnosis not present

## 2014-09-15 DIAGNOSIS — J4530 Mild persistent asthma, uncomplicated: Secondary | ICD-10-CM | POA: Diagnosis not present

## 2014-09-15 DIAGNOSIS — J309 Allergic rhinitis, unspecified: Secondary | ICD-10-CM | POA: Diagnosis not present

## 2014-09-15 DIAGNOSIS — R5383 Other fatigue: Secondary | ICD-10-CM | POA: Diagnosis not present

## 2014-09-27 DIAGNOSIS — S76811A Strain of other specified muscles, fascia and tendons at thigh level, right thigh, initial encounter: Secondary | ICD-10-CM | POA: Diagnosis not present

## 2014-09-29 DIAGNOSIS — I10 Essential (primary) hypertension: Secondary | ICD-10-CM | POA: Diagnosis not present

## 2014-09-29 DIAGNOSIS — E039 Hypothyroidism, unspecified: Secondary | ICD-10-CM | POA: Diagnosis not present

## 2014-09-29 DIAGNOSIS — M549 Dorsalgia, unspecified: Secondary | ICD-10-CM | POA: Diagnosis not present

## 2014-10-07 DIAGNOSIS — M7061 Trochanteric bursitis, right hip: Secondary | ICD-10-CM | POA: Diagnosis not present

## 2014-11-01 DIAGNOSIS — M25551 Pain in right hip: Secondary | ICD-10-CM | POA: Diagnosis not present

## 2014-11-01 DIAGNOSIS — E039 Hypothyroidism, unspecified: Secondary | ICD-10-CM | POA: Diagnosis not present

## 2014-11-01 DIAGNOSIS — I10 Essential (primary) hypertension: Secondary | ICD-10-CM | POA: Diagnosis not present

## 2014-11-30 DIAGNOSIS — M549 Dorsalgia, unspecified: Secondary | ICD-10-CM | POA: Diagnosis not present

## 2014-11-30 DIAGNOSIS — I10 Essential (primary) hypertension: Secondary | ICD-10-CM | POA: Diagnosis not present

## 2014-11-30 DIAGNOSIS — G894 Chronic pain syndrome: Secondary | ICD-10-CM | POA: Diagnosis not present

## 2014-12-08 DIAGNOSIS — M25559 Pain in unspecified hip: Secondary | ICD-10-CM | POA: Diagnosis not present

## 2014-12-20 DIAGNOSIS — M25551 Pain in right hip: Secondary | ICD-10-CM | POA: Diagnosis not present

## 2014-12-29 DIAGNOSIS — Z87891 Personal history of nicotine dependence: Secondary | ICD-10-CM | POA: Diagnosis not present

## 2014-12-29 DIAGNOSIS — E039 Hypothyroidism, unspecified: Secondary | ICD-10-CM | POA: Diagnosis not present

## 2014-12-29 DIAGNOSIS — M549 Dorsalgia, unspecified: Secondary | ICD-10-CM | POA: Diagnosis not present

## 2014-12-29 DIAGNOSIS — I10 Essential (primary) hypertension: Secondary | ICD-10-CM | POA: Diagnosis not present

## 2014-12-29 DIAGNOSIS — E785 Hyperlipidemia, unspecified: Secondary | ICD-10-CM | POA: Diagnosis not present

## 2014-12-29 DIAGNOSIS — M25551 Pain in right hip: Secondary | ICD-10-CM | POA: Diagnosis not present

## 2014-12-31 DIAGNOSIS — M25551 Pain in right hip: Secondary | ICD-10-CM | POA: Diagnosis not present

## 2015-01-13 DIAGNOSIS — M25551 Pain in right hip: Secondary | ICD-10-CM | POA: Diagnosis not present

## 2015-01-20 DIAGNOSIS — M87052 Idiopathic aseptic necrosis of left femur: Secondary | ICD-10-CM | POA: Diagnosis not present

## 2015-01-20 DIAGNOSIS — M25551 Pain in right hip: Secondary | ICD-10-CM | POA: Diagnosis not present

## 2015-01-20 DIAGNOSIS — M87051 Idiopathic aseptic necrosis of right femur: Secondary | ICD-10-CM | POA: Diagnosis not present

## 2015-01-20 DIAGNOSIS — R600 Localized edema: Secondary | ICD-10-CM | POA: Diagnosis not present

## 2015-01-24 DIAGNOSIS — M25551 Pain in right hip: Secondary | ICD-10-CM | POA: Diagnosis not present

## 2015-01-24 DIAGNOSIS — M25552 Pain in left hip: Secondary | ICD-10-CM | POA: Diagnosis not present

## 2015-01-29 DIAGNOSIS — R1012 Left upper quadrant pain: Secondary | ICD-10-CM | POA: Diagnosis not present

## 2015-01-29 DIAGNOSIS — F17210 Nicotine dependence, cigarettes, uncomplicated: Secondary | ICD-10-CM | POA: Diagnosis not present

## 2015-01-29 DIAGNOSIS — J45909 Unspecified asthma, uncomplicated: Secondary | ICD-10-CM | POA: Diagnosis not present

## 2015-01-29 DIAGNOSIS — E039 Hypothyroidism, unspecified: Secondary | ICD-10-CM | POA: Diagnosis not present

## 2015-01-29 DIAGNOSIS — E860 Dehydration: Secondary | ICD-10-CM | POA: Diagnosis not present

## 2015-01-29 DIAGNOSIS — M1990 Unspecified osteoarthritis, unspecified site: Secondary | ICD-10-CM | POA: Diagnosis not present

## 2015-01-29 DIAGNOSIS — R109 Unspecified abdominal pain: Secondary | ICD-10-CM | POA: Diagnosis not present

## 2015-01-29 DIAGNOSIS — D72829 Elevated white blood cell count, unspecified: Secondary | ICD-10-CM | POA: Diagnosis not present

## 2015-01-29 DIAGNOSIS — I10 Essential (primary) hypertension: Secondary | ICD-10-CM | POA: Diagnosis not present

## 2015-01-29 DIAGNOSIS — R112 Nausea with vomiting, unspecified: Secondary | ICD-10-CM | POA: Diagnosis not present

## 2015-01-31 DIAGNOSIS — J449 Chronic obstructive pulmonary disease, unspecified: Secondary | ICD-10-CM | POA: Diagnosis not present

## 2015-01-31 DIAGNOSIS — M25551 Pain in right hip: Secondary | ICD-10-CM | POA: Diagnosis not present

## 2015-01-31 DIAGNOSIS — D72829 Elevated white blood cell count, unspecified: Secondary | ICD-10-CM | POA: Diagnosis not present

## 2015-01-31 DIAGNOSIS — G894 Chronic pain syndrome: Secondary | ICD-10-CM | POA: Diagnosis not present

## 2015-01-31 DIAGNOSIS — I10 Essential (primary) hypertension: Secondary | ICD-10-CM | POA: Diagnosis not present

## 2015-02-03 DIAGNOSIS — E039 Hypothyroidism, unspecified: Secondary | ICD-10-CM | POA: Diagnosis not present

## 2015-02-03 DIAGNOSIS — I10 Essential (primary) hypertension: Secondary | ICD-10-CM | POA: Diagnosis not present

## 2015-02-03 DIAGNOSIS — Z885 Allergy status to narcotic agent status: Secondary | ICD-10-CM | POA: Diagnosis not present

## 2015-02-03 DIAGNOSIS — F419 Anxiety disorder, unspecified: Secondary | ICD-10-CM | POA: Diagnosis not present

## 2015-02-03 DIAGNOSIS — F17210 Nicotine dependence, cigarettes, uncomplicated: Secondary | ICD-10-CM | POA: Diagnosis not present

## 2015-02-03 DIAGNOSIS — J449 Chronic obstructive pulmonary disease, unspecified: Secondary | ICD-10-CM | POA: Diagnosis not present

## 2015-02-03 DIAGNOSIS — E785 Hyperlipidemia, unspecified: Secondary | ICD-10-CM | POA: Diagnosis not present

## 2015-02-03 DIAGNOSIS — M25552 Pain in left hip: Secondary | ICD-10-CM | POA: Diagnosis not present

## 2015-02-03 DIAGNOSIS — M25551 Pain in right hip: Secondary | ICD-10-CM | POA: Diagnosis not present

## 2015-02-03 DIAGNOSIS — Z886 Allergy status to analgesic agent status: Secondary | ICD-10-CM | POA: Diagnosis not present

## 2015-02-03 DIAGNOSIS — Z888 Allergy status to other drugs, medicaments and biological substances status: Secondary | ICD-10-CM | POA: Diagnosis not present

## 2015-02-03 DIAGNOSIS — M87051 Idiopathic aseptic necrosis of right femur: Secondary | ICD-10-CM | POA: Diagnosis not present

## 2015-02-11 DIAGNOSIS — E039 Hypothyroidism, unspecified: Secondary | ICD-10-CM | POA: Diagnosis not present

## 2015-02-11 DIAGNOSIS — I10 Essential (primary) hypertension: Secondary | ICD-10-CM | POA: Diagnosis not present

## 2015-02-11 DIAGNOSIS — J449 Chronic obstructive pulmonary disease, unspecified: Secondary | ICD-10-CM | POA: Diagnosis not present

## 2015-02-11 DIAGNOSIS — J45909 Unspecified asthma, uncomplicated: Secondary | ICD-10-CM | POA: Diagnosis not present

## 2015-02-11 DIAGNOSIS — Z01818 Encounter for other preprocedural examination: Secondary | ICD-10-CM | POA: Diagnosis not present

## 2015-02-11 DIAGNOSIS — M879 Osteonecrosis, unspecified: Secondary | ICD-10-CM | POA: Diagnosis not present

## 2015-02-14 DIAGNOSIS — M87051 Idiopathic aseptic necrosis of right femur: Secondary | ICD-10-CM | POA: Diagnosis not present

## 2015-02-14 DIAGNOSIS — Z87891 Personal history of nicotine dependence: Secondary | ICD-10-CM | POA: Diagnosis not present

## 2015-02-14 DIAGNOSIS — M25552 Pain in left hip: Secondary | ICD-10-CM | POA: Diagnosis not present

## 2015-02-14 DIAGNOSIS — R011 Cardiac murmur, unspecified: Secondary | ICD-10-CM | POA: Diagnosis present

## 2015-02-14 DIAGNOSIS — G8929 Other chronic pain: Secondary | ICD-10-CM | POA: Diagnosis present

## 2015-02-14 DIAGNOSIS — Z79891 Long term (current) use of opiate analgesic: Secondary | ICD-10-CM | POA: Diagnosis not present

## 2015-02-14 DIAGNOSIS — E039 Hypothyroidism, unspecified: Secondary | ICD-10-CM | POA: Diagnosis present

## 2015-02-14 DIAGNOSIS — G8918 Other acute postprocedural pain: Secondary | ICD-10-CM | POA: Diagnosis not present

## 2015-02-14 DIAGNOSIS — F419 Anxiety disorder, unspecified: Secondary | ICD-10-CM | POA: Diagnosis present

## 2015-02-14 DIAGNOSIS — M25551 Pain in right hip: Secondary | ICD-10-CM | POA: Diagnosis not present

## 2015-02-14 DIAGNOSIS — Z471 Aftercare following joint replacement surgery: Secondary | ICD-10-CM | POA: Diagnosis not present

## 2015-02-14 DIAGNOSIS — J449 Chronic obstructive pulmonary disease, unspecified: Secondary | ICD-10-CM | POA: Diagnosis present

## 2015-02-14 DIAGNOSIS — Z96641 Presence of right artificial hip joint: Secondary | ICD-10-CM | POA: Diagnosis not present

## 2015-02-14 DIAGNOSIS — I10 Essential (primary) hypertension: Secondary | ICD-10-CM | POA: Diagnosis present

## 2015-02-14 DIAGNOSIS — J45909 Unspecified asthma, uncomplicated: Secondary | ICD-10-CM | POA: Diagnosis present

## 2015-02-14 DIAGNOSIS — E785 Hyperlipidemia, unspecified: Secondary | ICD-10-CM | POA: Diagnosis present

## 2015-02-21 DIAGNOSIS — M25551 Pain in right hip: Secondary | ICD-10-CM | POA: Diagnosis not present

## 2015-02-23 DIAGNOSIS — M25551 Pain in right hip: Secondary | ICD-10-CM | POA: Diagnosis not present

## 2015-02-28 DIAGNOSIS — M25551 Pain in right hip: Secondary | ICD-10-CM | POA: Diagnosis not present

## 2015-03-03 DIAGNOSIS — Z471 Aftercare following joint replacement surgery: Secondary | ICD-10-CM | POA: Diagnosis not present

## 2015-03-03 DIAGNOSIS — Z885 Allergy status to narcotic agent status: Secondary | ICD-10-CM | POA: Diagnosis not present

## 2015-03-03 DIAGNOSIS — Z886 Allergy status to analgesic agent status: Secondary | ICD-10-CM | POA: Diagnosis not present

## 2015-03-03 DIAGNOSIS — J449 Chronic obstructive pulmonary disease, unspecified: Secondary | ICD-10-CM | POA: Diagnosis not present

## 2015-03-03 DIAGNOSIS — Z96641 Presence of right artificial hip joint: Secondary | ICD-10-CM | POA: Diagnosis not present

## 2015-03-03 DIAGNOSIS — J45909 Unspecified asthma, uncomplicated: Secondary | ICD-10-CM | POA: Diagnosis not present

## 2015-03-03 DIAGNOSIS — Z87891 Personal history of nicotine dependence: Secondary | ICD-10-CM | POA: Diagnosis not present

## 2015-03-03 DIAGNOSIS — I10 Essential (primary) hypertension: Secondary | ICD-10-CM | POA: Diagnosis not present

## 2015-03-07 DIAGNOSIS — M25551 Pain in right hip: Secondary | ICD-10-CM | POA: Diagnosis not present

## 2015-03-10 DIAGNOSIS — M25551 Pain in right hip: Secondary | ICD-10-CM | POA: Diagnosis not present

## 2015-03-14 DIAGNOSIS — M25551 Pain in right hip: Secondary | ICD-10-CM | POA: Diagnosis not present

## 2015-03-16 DIAGNOSIS — M25551 Pain in right hip: Secondary | ICD-10-CM | POA: Diagnosis not present

## 2015-03-21 DIAGNOSIS — M25551 Pain in right hip: Secondary | ICD-10-CM | POA: Diagnosis not present

## 2015-03-29 DIAGNOSIS — M25551 Pain in right hip: Secondary | ICD-10-CM | POA: Diagnosis not present

## 2015-03-30 DIAGNOSIS — E039 Hypothyroidism, unspecified: Secondary | ICD-10-CM | POA: Diagnosis not present

## 2015-03-30 DIAGNOSIS — E785 Hyperlipidemia, unspecified: Secondary | ICD-10-CM | POA: Diagnosis not present

## 2015-03-30 DIAGNOSIS — M25552 Pain in left hip: Secondary | ICD-10-CM | POA: Diagnosis not present

## 2015-03-30 DIAGNOSIS — I10 Essential (primary) hypertension: Secondary | ICD-10-CM | POA: Diagnosis not present

## 2015-03-30 DIAGNOSIS — J45909 Unspecified asthma, uncomplicated: Secondary | ICD-10-CM | POA: Diagnosis not present

## 2015-03-30 DIAGNOSIS — Z96641 Presence of right artificial hip joint: Secondary | ICD-10-CM | POA: Diagnosis not present

## 2015-03-30 DIAGNOSIS — J449 Chronic obstructive pulmonary disease, unspecified: Secondary | ICD-10-CM | POA: Diagnosis not present

## 2015-03-30 DIAGNOSIS — Z471 Aftercare following joint replacement surgery: Secondary | ICD-10-CM | POA: Diagnosis not present

## 2015-03-30 DIAGNOSIS — M25551 Pain in right hip: Secondary | ICD-10-CM | POA: Diagnosis not present

## 2015-05-20 DIAGNOSIS — E039 Hypothyroidism, unspecified: Secondary | ICD-10-CM | POA: Diagnosis not present

## 2015-05-20 DIAGNOSIS — I10 Essential (primary) hypertension: Secondary | ICD-10-CM | POA: Diagnosis not present

## 2015-05-20 DIAGNOSIS — J449 Chronic obstructive pulmonary disease, unspecified: Secondary | ICD-10-CM | POA: Diagnosis not present

## 2015-05-20 DIAGNOSIS — Z79899 Other long term (current) drug therapy: Secondary | ICD-10-CM | POA: Diagnosis not present

## 2015-05-20 DIAGNOSIS — M25551 Pain in right hip: Secondary | ICD-10-CM | POA: Diagnosis not present

## 2015-05-20 DIAGNOSIS — G894 Chronic pain syndrome: Secondary | ICD-10-CM | POA: Diagnosis not present

## 2015-06-01 DIAGNOSIS — Z96641 Presence of right artificial hip joint: Secondary | ICD-10-CM | POA: Diagnosis not present

## 2015-06-01 DIAGNOSIS — Z885 Allergy status to narcotic agent status: Secondary | ICD-10-CM | POA: Diagnosis not present

## 2015-06-01 DIAGNOSIS — Z87891 Personal history of nicotine dependence: Secondary | ICD-10-CM | POA: Diagnosis not present

## 2015-06-01 DIAGNOSIS — Z471 Aftercare following joint replacement surgery: Secondary | ICD-10-CM | POA: Diagnosis not present

## 2015-06-01 DIAGNOSIS — I10 Essential (primary) hypertension: Secondary | ICD-10-CM | POA: Diagnosis not present

## 2015-06-01 DIAGNOSIS — Z4789 Encounter for other orthopedic aftercare: Secondary | ICD-10-CM | POA: Diagnosis not present

## 2015-06-01 DIAGNOSIS — E785 Hyperlipidemia, unspecified: Secondary | ICD-10-CM | POA: Diagnosis not present

## 2015-06-01 DIAGNOSIS — Z888 Allergy status to other drugs, medicaments and biological substances status: Secondary | ICD-10-CM | POA: Diagnosis not present

## 2015-06-01 DIAGNOSIS — E039 Hypothyroidism, unspecified: Secondary | ICD-10-CM | POA: Diagnosis not present

## 2015-06-09 DIAGNOSIS — J069 Acute upper respiratory infection, unspecified: Secondary | ICD-10-CM | POA: Diagnosis not present

## 2015-06-20 DIAGNOSIS — E785 Hyperlipidemia, unspecified: Secondary | ICD-10-CM | POA: Diagnosis not present

## 2015-06-20 DIAGNOSIS — Z79899 Other long term (current) drug therapy: Secondary | ICD-10-CM | POA: Diagnosis not present

## 2015-06-20 DIAGNOSIS — G894 Chronic pain syndrome: Secondary | ICD-10-CM | POA: Diagnosis not present

## 2015-06-20 DIAGNOSIS — J449 Chronic obstructive pulmonary disease, unspecified: Secondary | ICD-10-CM | POA: Diagnosis not present

## 2015-06-20 DIAGNOSIS — I10 Essential (primary) hypertension: Secondary | ICD-10-CM | POA: Diagnosis not present

## 2015-07-08 DIAGNOSIS — M545 Low back pain: Secondary | ICD-10-CM | POA: Diagnosis not present

## 2015-07-12 DIAGNOSIS — Z23 Encounter for immunization: Secondary | ICD-10-CM | POA: Diagnosis not present

## 2015-07-12 DIAGNOSIS — Z79899 Other long term (current) drug therapy: Secondary | ICD-10-CM | POA: Diagnosis not present

## 2015-07-12 DIAGNOSIS — G894 Chronic pain syndrome: Secondary | ICD-10-CM | POA: Diagnosis not present

## 2015-07-12 DIAGNOSIS — M549 Dorsalgia, unspecified: Secondary | ICD-10-CM | POA: Diagnosis not present

## 2015-08-05 DIAGNOSIS — F331 Major depressive disorder, recurrent, moderate: Secondary | ICD-10-CM | POA: Diagnosis not present

## 2015-08-05 DIAGNOSIS — G894 Chronic pain syndrome: Secondary | ICD-10-CM | POA: Diagnosis not present

## 2015-08-05 DIAGNOSIS — Z79899 Other long term (current) drug therapy: Secondary | ICD-10-CM | POA: Diagnosis not present

## 2015-08-05 DIAGNOSIS — M549 Dorsalgia, unspecified: Secondary | ICD-10-CM | POA: Diagnosis not present

## 2015-08-16 ENCOUNTER — Encounter: Payer: Self-pay | Admitting: Pain Medicine

## 2015-08-16 ENCOUNTER — Ambulatory Visit: Payer: Medicare Other | Attending: Pain Medicine | Admitting: Pain Medicine

## 2015-08-16 VITALS — BP 115/62 | HR 70 | Temp 95.8°F | Resp 15 | Ht 67.0 in | Wt 198.0 lb

## 2015-08-16 DIAGNOSIS — Z981 Arthrodesis status: Secondary | ICD-10-CM | POA: Diagnosis not present

## 2015-08-16 DIAGNOSIS — M47817 Spondylosis without myelopathy or radiculopathy, lumbosacral region: Secondary | ICD-10-CM | POA: Diagnosis not present

## 2015-08-16 DIAGNOSIS — M797 Fibromyalgia: Secondary | ICD-10-CM | POA: Diagnosis not present

## 2015-08-16 DIAGNOSIS — K219 Gastro-esophageal reflux disease without esophagitis: Secondary | ICD-10-CM | POA: Insufficient documentation

## 2015-08-16 DIAGNOSIS — M533 Sacrococcygeal disorders, not elsewhere classified: Secondary | ICD-10-CM | POA: Diagnosis not present

## 2015-08-16 DIAGNOSIS — J45909 Unspecified asthma, uncomplicated: Secondary | ICD-10-CM | POA: Diagnosis not present

## 2015-08-16 DIAGNOSIS — M5136 Other intervertebral disc degeneration, lumbar region: Secondary | ICD-10-CM | POA: Diagnosis not present

## 2015-08-16 DIAGNOSIS — M5030 Other cervical disc degeneration, unspecified cervical region: Secondary | ICD-10-CM

## 2015-08-16 DIAGNOSIS — M79604 Pain in right leg: Secondary | ICD-10-CM | POA: Diagnosis present

## 2015-08-16 DIAGNOSIS — M5416 Radiculopathy, lumbar region: Secondary | ICD-10-CM

## 2015-08-16 DIAGNOSIS — M47816 Spondylosis without myelopathy or radiculopathy, lumbar region: Secondary | ICD-10-CM

## 2015-08-16 DIAGNOSIS — M47812 Spondylosis without myelopathy or radiculopathy, cervical region: Secondary | ICD-10-CM | POA: Diagnosis not present

## 2015-08-16 DIAGNOSIS — M5412 Radiculopathy, cervical region: Secondary | ICD-10-CM | POA: Diagnosis not present

## 2015-08-16 DIAGNOSIS — M79605 Pain in left leg: Secondary | ICD-10-CM | POA: Diagnosis present

## 2015-08-16 DIAGNOSIS — M545 Low back pain: Secondary | ICD-10-CM | POA: Diagnosis present

## 2015-08-16 NOTE — Progress Notes (Signed)
Safety precautions to be maintained throughout the outpatient stay will include: orient to surroundings, keep bed in low position, maintain call bell within reach at all times, provide assistance with transfer out of bed and ambulation.  uds 08/16/15 no meds

## 2015-08-16 NOTE — Progress Notes (Signed)
Subjective:    Patient ID: Jill Robertson, female    DOB: [birth year], 54 y.o.   MRN: [id number]  HPI Patient is 54 year old female at the request of Dr.Saad Amin for further evaluation and treatment of pain involving the lower back and lower extremity region predominantly. Patient states that the pain involving the lower back region predominantly the patient is with prior surgical intervention of the cervical region and states that she hopes to avoid surgery of the lumbar region. The patient will follow-up with Dr. Sherley Bounds for neurosurgical reevaluation as discussed. Patient states that her pain of the lower back region is an aching unknowing sensation that is shooting tingling tiring an uncomfortable sensation that becomes more intense as the day progresses The patient described her pain as a pressure-like sensation with patient from sleep and interferes patient ability to go to sleep Patient states that the pain is increased with bending climbing intercourse and lifting sitting standing squatting walking. Patient states that the pain has decreased with resting lying down hot packs and with medications reviewed patient studies of the cervical region and discuss obtaining updated studies of the lumbar region. The patient appeared to be with symptoms consistent with lumbar facet syndrome. We will proceed with scheduling patient for lumbar facet, medial branch nerve, blocks at time return appointment in attempt to decrease severity of patient's symptoms, minimize progression of patient's symptoms, and avoid need for more involved treatment. The patient was understanding and in agreement with suggested treatment plan.      Review of Systems     Objective:   Physical Exam  Cardiovascular Heart murmur  Pulmonary Asthma  Neurological Unremarkable   Psychological Anxiety Depression  Gastrointestinal Hiatal hernia Gastroscopic reflux  disease  Genitourinary Unremarkable  Hematological Unremarkable  Endocrine Low thyroid  Rheumatological Osteoarthritis Fibromyalgia Chronic fatigue syndrome  Musculoskeletal Unremarkable  Of the significant Unremarkable     PHYSICAL EXAM   There was tenderness of the splenius capitis and occipitalis musculature region of mild degree. Palpation of the cervical facet cervical paraspinal musculature region reproduced mild discomfort. There appeared to be unremarkable Spurling's maneuver. Tinel and Phalen's maneuver were without increase of pain of significant degree. There was well-healed surgical scar the cervical region without increased warmth or erythema in the region of the scar. There was minimal tenderness over the thoracic facet thoracic paraspinal musculature region and no crepitus of the thoracic region was noted. Palpation over the lumbar paraspinal musculature region lumbar facet region was a tends to palpation with lateral bending and rotation and extension and palpation over the lumbar facets reproducing moderate to moderately severe discomfort. There was moderate to moderately severe tenderness to palpation over the lumbar facet region on the left as well as on the right. There was moderate tenderness of the PSIS and PII S region as well as the gluteal and piriformis musculature regions. There was well-healed surgical scar the right hip without increased warmth or erythema in the region scar straight leg raising was tolerates approximately 20 without increased pain with dorsiflexion noted. No definite sensory deficit of dermatomal distribution was detected. There was negative clonus negative Homans. DTRs were difficult to elicit patient had difficulty relaxing. Knees were with mild crepitus of the knees with negative anterior and posterior drawer signs without ballottement of the patella noted. EHL strength appeared to be decreased there was negative clonus negative Homans.  Abdomen was nontender with no costovertebral angle tenderness noted. The predominant portion of patient's pain was reproduced with palpation over  the lumbar facet lumbar paraspinal musculature region with lateral bending and rotation reproducing severe pain as well       Assessment & Plan:  Degenerative disc disease lumbar spine  Lumbar facet syndrome  Sacroiliac joint dysfunction  Degenerative disc disease cervical spine Status post C4-C7 ACDF     PLAN   Continue present medication  Lumbar facet, medial branch nerve, blocks to be performed at time return appointment as discussed  F/U PCP Dr Garwin Brothers  for evaliation of  BP and general medical  condition  F/U surgical evaluation. Patient will follow-up Dr. Sherley Bounds as discussed  F/U neurological evaluation. May consider [facility] EMG studies and other studies  pending follow-up evaluations  May consider radiofrequency rhizolysis or intraspinal procedures pending response to present treatment and F/U evaluation   Patient to call [facility] should patient have concerns prior to scheduled return appointment.

## 2015-08-16 NOTE — Patient Instructions (Addendum)
PLAN   Continue present medications for now as discussed  Lumbar facet, medial branch nerve blocks to be performed at time return appointment as discussed  F/U PCP  S Amin  for evaliation of  BP and general medical  condition  F/U surgical evaluation. Follow-up with Dr. Sherley Bounds as discussed  F/U neurological evaluation. May consider pending follow-up evaluations  Ask receptionists and nurses to date of your lumbar MRI  May consider radiofrequency rhizolysis or intraspinal procedures pending response to present treatment and F/U evaluation   Patient to call [facility] should patient have concerns prior to scheduled return appointment. GENERAL RISKS AND COMPLICATIONS  What are the risk, side effects and possible complications? Generally speaking, most procedures are safe.  However, with any procedure there are risks, side effects, and the possibility of complications.  The risks and complications are dependent upon the sites that are lesioned, or the type of nerve block to be performed.  The closer the procedure is to the spine, the more serious the risks are.  Great care is taken when placing the radio frequency needles, block needles or lesioning probes, but sometimes complications can occur. 1. Infection: Any time there is an injection through the skin, there is a risk of infection.  This is why sterile conditions are used for these blocks.  There are four possible types of infection. 1. Localized skin infection. 2. Central Nervous System Infection-This can be in the form of Meningitis, which can be deadly. 3. Epidural Infections-This can be in the form of an epidural abscess, which can cause pressure inside of the spine, causing compression of the spinal cord with subsequent paralysis. This would require an emergency surgery to decompress, and there are no guarantees that the patient would recover from the paralysis. 4. Discitis-This is an infection of the intervertebral  discs.  It occurs in about 1% of discography procedures.  It is difficult to treat and it may lead to surgery.        2. Pain: the needles have to go through skin and soft tissues, will cause soreness.       3. Damage to internal structures:  The nerves to be lesioned may be near blood vessels or    other nerves which can be potentially damaged.       4. Bleeding: Bleeding is more common if the patient is taking blood thinners such as  aspirin, Coumadin, Ticiid, Plavix, etc., or if he/she have some genetic predisposition  such as hemophilia. Bleeding into the spinal canal can cause compression of the spinal  cord with subsequent paralysis.  This would require an emergency surgery to  decompress and there are no guarantees that the patient would recover from the  paralysis.       5. Pneumothorax:  Puncturing of a lung is a possibility, every time a needle is introduced in  the area of the chest or upper back.  Pneumothorax refers to free air around the  collapsed lung(s), inside of the thoracic cavity (chest cavity).  Another two possible  complications related to a similar event would include: Hemothorax and Chylothorax.   These are variations of the Pneumothorax, where instead of air around the collapsed  lung(s), you may have blood or chyle, respectively.       6. Spinal headaches: They may occur with any procedures in the area of the spine.       7. Persistent CSF (Cerebro-Spinal Fluid) leakage: This is a rare problem, but may  occur  with prolonged intrathecal or epidural catheters either due to the formation of a fistulous  track or a dural tear.       8. Nerve damage: By working so close to the spinal cord, there is always a possibility of  nerve damage, which could be as serious as a permanent spinal cord injury with  paralysis.       9. Death:  Although rare, severe deadly allergic reactions known as "Anaphylactic  reaction" can occur to any of the medications used.      10. Worsening of the  symptoms:  We can always make thing worse.  What are the chances of something like this happening? Chances of any of this occuring are extremely low.  By statistics, you have more of a chance of getting killed in a motor vehicle accident: while driving to the hospital than any of the above occurring .  Nevertheless, you should be aware that they are possibilities.  In general, it is similar to taking a shower.  Everybody knows that you can slip, hit your head and get killed.  Does that mean that you should not shower again?  Nevertheless always keep in mind that statistics do not mean anything if you happen to be on the wrong side of them.  Even if a procedure has a 1 (one) in a 1,000,000 (million) chance of going wrong, it you happen to be that one..Also, keep in mind that by statistics, you have more of a chance of having something go wrong when taking medications.  Who should not have this procedure? If you are on a blood thinning medication (e.g. Coumadin, Plavix, see list of "Blood Thinners"), or if you have an active infection going on, you should not have the procedure.  If you are taking any blood thinners, please inform your physician.  How should I prepare for this procedure?  Do not eat or drink anything at least six hours prior to the procedure.  Bring a driver with you .  It cannot be a taxi.  Come accompanied by an adult that can drive you back, and that is strong enough to help you if your legs get weak or numb from the local anesthetic.  Take all of your medicines the morning of the procedure with just enough water to swallow them.  If you have diabetes, make sure that you are scheduled to have your procedure done first thing in the morning, whenever possible.  If you have diabetes, take only half of your insulin dose and notify our nurse that you have done so as soon as you arrive at the clinic.  If you are diabetic, but only take blood sugar pills (oral hypoglycemic), then do  not take them on the morning of your procedure.  You may take them after you have had the procedure.  Do not take aspirin or any aspirin-containing medications, at least eleven (11) days prior to the procedure.  They may prolong bleeding.  Wear loose fitting clothing that may be easy to take off and that you would not mind if it got stained with Betadine or blood.  Do not wear any jewelry or perfume  Remove any nail coloring.  It will interfere with some of our monitoring equipment.  NOTE: Remember that this is not meant to be interpreted as a complete list of all possible complications.  Unforeseen problems may occur.  BLOOD THINNERS The following drugs contain aspirin or other products, which can cause increased bleeding  during surgery and should not be taken for 2 weeks prior to and 1 week after surgery.  If you should need take something for relief of minor pain, you may take acetaminophen which is found in Tylenol,m Datril, Anacin-3 and Panadol. It is not blood thinner. The products listed below are.  Do not take any of the products listed below in addition to any listed on your instruction sheet.  A.P.C or A.P.C with Codeine Codeine Phosphate Capsules #3 Ibuprofen Ridaura  ABC compound Congesprin Imuran rimadil  Advil Cope Indocin Robaxisal  Alka-Seltzer Effervescent Pain Reliever and Antacid Coricidin or Coricidin-D  Indomethacin Rufen  Alka-Seltzer plus Cold Medicine Cosprin Ketoprofen S-A-C Tablets  Anacin Analgesic Tablets or Capsules Coumadin Korlgesic Salflex  Anacin Extra Strength Analgesic tablets or capsules CP-2 Tablets Lanoril Salicylate  Anaprox Cuprimine Capsules Levenox Salocol  Anexsia-D Dalteparin Magan Salsalate  Anodynos Darvon compound Magnesium Salicylate Sine-off  Ansaid Dasin Capsules Magsal Sodium Salicylate  Anturane Depen Capsules Marnal Soma  APF Arthritis pain formula Dewitt's Pills Measurin Stanback  Argesic Dia-Gesic Meclofenamic Sulfinpyrazone   Arthritis Bayer Timed Release Aspirin Diclofenac Meclomen Sulindac  Arthritis pain formula Anacin Dicumarol Medipren Supac  Analgesic (Safety coated) Arthralgen Diffunasal Mefanamic Suprofen  Arthritis Strength Bufferin Dihydrocodeine Mepro Compound Suprol  Arthropan liquid Dopirydamole Methcarbomol with Aspirin Synalgos  ASA tablets/Enseals Disalcid Micrainin Tagament  Ascriptin Doan's Midol Talwin  Ascriptin A/D Dolene Mobidin Tanderil  Ascriptin Extra Strength Dolobid Moblgesic Ticlid  Ascriptin with Codeine Doloprin or Doloprin with Codeine Momentum Tolectin  Asperbuf Duoprin Mono-gesic Trendar  Aspergum Duradyne Motrin or Motrin IB Triminicin  Aspirin plain, buffered or enteric coated Durasal Myochrisine Trigesic  Aspirin Suppositories Easprin Nalfon Trillsate  Aspirin with Codeine Ecotrin Regular or Extra Strength Naprosyn Uracel  Atromid-S Efficin Naproxen Ursinus  Auranofin Capsules Elmiron Neocylate Vanquish  Axotal Emagrin Norgesic Verin  Azathioprine Empirin or Empirin with Codeine Normiflo Vitamin E  Azolid Emprazil Nuprin Voltaren  Bayer Aspirin plain, buffered or children's or timed BC Tablets or powders Encaprin Orgaran Warfarin Sodium  Buff-a-Comp Enoxaparin Orudis Zorpin  Buff-a-Comp with Codeine Equegesic Os-Cal-Gesic   Buffaprin Excedrin plain, buffered or Extra Strength Oxalid   Bufferin Arthritis Strength Feldene Oxphenbutazone   Bufferin plain or Extra Strength Feldene Capsules Oxycodone with Aspirin   Bufferin with Codeine Fenoprofen Fenoprofen Pabalate or Pabalate-SF   Buffets II Flogesic Panagesic   Buffinol plain or Extra Strength Florinal or Florinal with Codeine Panwarfarin   Buf-Tabs Flurbiprofen Penicillamine   Butalbital Compound Four-way cold tablets Penicillin   Butazolidin Fragmin Pepto-Bismol   Carbenicillin Geminisyn Percodan   Carna Arthritis Reliever Geopen Persantine   Carprofen Gold's salt Persistin   Chloramphenicol Goody's Phenylbutazone    Chloromycetin Haltrain Piroxlcam   Clmetidine heparin Plaquenil   Cllnoril Hyco-pap Ponstel   Clofibrate Hydroxy chloroquine Propoxyphen         Before stopping any of these medications, be sure to consult the physician who ordered them.  Some, such as Coumadin (Warfarin) are ordered to prevent or treat serious conditions such as "deep thrombosis", "pumonary embolisms", and other heart problems.  The amount of time that you may need off of the medication may also vary with the medication and the reason for which you were taking it.  If you are taking any of these medications, please make sure you notify your pain physician before you undergo any procedures.         Facet Blocks Patient Information  Description: The facets are joints in the spine between the vertebrae.  Like any joints in the body, facets can become irritated and painful.  Arthritis can also effect the facets.  By injecting steroids and local anesthetic in and around these joints, we can temporarily block the nerve supply to them.  Steroids act directly on irritated nerves and tissues to reduce selling and inflammation which often leads to decreased pain.  Facet blocks may be done anywhere along the spine from the neck to the low back depending upon the location of your pain.   After numbing the skin with local anesthetic (like Novocaine), a small needle is passed onto the facet joints under x-ray guidance.  You may experience a sensation of pressure while this is being done.  The entire block usually lasts about 15-25 minutes.   Conditions which may be treated by facet blocks:   Low back/buttock pain  Neck/shoulder pain  Certain types of headaches  Preparation for the injection:  1. Do not eat any solid food or dairy products within 6 hours of your appointment. 2. You may drink clear liquid up to 2 hours before appointment.  Clear liquids include water, black coffee, juice or soda.  No milk or cream please. 3. You  may take your regular medication, including pain medications, with a sip of water before your appointment.  Diabetics should hold regular insulin (if taken separately) and take 1/2 normal NPH dose the morning of the procedure.  Carry some sugar containing items with you to your appointment. 4. A driver must accompany you and be prepared to drive you home after your procedure. 5. Bring all your current medications with you. 6. An IV may be inserted and sedation may be given at the discretion of the physician. 7. A blood pressure cuff, EKG and other monitors will often be applied during the procedure.  Some patients may need to have extra oxygen administered for a short period. 8. You will be asked to provide medical information, including your allergies and medications, prior to the procedure.  We must know immediately if you are taking blood thinners (like Coumadin/Warfarin) or if you are allergic to IV iodine contrast (dye).  We must know if you could possible be pregnant.  Possible side-effects:   Bleeding from needle site  Infection (rare, may require surgery)  Nerve injury (rare)  Numbness & tingling (temporary)  Difficulty urinating (rare, temporary)  Spinal headache (a headache worse with upright posture)  Light-headedness (temporary)  Pain at injection site (serveral days)  Decreased blood pressure (rare, temporary)  Weakness in arm/leg (temporary)  Pressure sensation in back/neck (temporary)   Call if you experience:   Fever/chills associated with headache or increased back/neck pain  Headache worsened by an upright position  New onset, weakness or numbness of an extremity below the injection site  Hives or difficulty breathing (go to the emergency room)  Inflammation or drainage at the injection site(s)  Severe back/neck pain greater than usual  New symptoms which are concerning to you  Please note:  Although the local anesthetic injected can often make  your back or neck feel good for several hours after the injection, the pain will likely return. It takes 3-7 days for steroids to work.  You may not notice any pain relief for at least one week.  If effective, we will often do a series of 2-3 injections spaced 3-6 weeks apart to maximally decrease your pain.  After the initial series, you may be a candidate for a more permanent nerve block of the facets.  If you have any questions, please call #[phone]) [facility]

## 2015-08-18 ENCOUNTER — Telehealth: Payer: Self-pay | Admitting: *Deleted

## 2015-08-22 ENCOUNTER — Encounter: Payer: Self-pay | Admitting: Pain Medicine

## 2015-08-22 ENCOUNTER — Ambulatory Visit: Payer: Medicare Other | Attending: Pain Medicine | Admitting: Pain Medicine

## 2015-08-22 VITALS — BP 104/69 | HR 68 | Temp 98.1°F | Resp 14 | Ht 67.0 in | Wt 198.0 lb

## 2015-08-22 DIAGNOSIS — Z79899 Other long term (current) drug therapy: Secondary | ICD-10-CM | POA: Insufficient documentation

## 2015-08-22 DIAGNOSIS — M5030 Other cervical disc degeneration, unspecified cervical region: Secondary | ICD-10-CM

## 2015-08-22 DIAGNOSIS — M545 Low back pain: Secondary | ICD-10-CM | POA: Insufficient documentation

## 2015-08-22 DIAGNOSIS — M79606 Pain in leg, unspecified: Secondary | ICD-10-CM | POA: Diagnosis not present

## 2015-08-22 DIAGNOSIS — M51369 Other intervertebral disc degeneration, lumbar region without mention of lumbar back pain or lower extremity pain: Secondary | ICD-10-CM

## 2015-08-22 DIAGNOSIS — M47816 Spondylosis without myelopathy or radiculopathy, lumbar region: Secondary | ICD-10-CM

## 2015-08-22 DIAGNOSIS — M47817 Spondylosis without myelopathy or radiculopathy, lumbosacral region: Secondary | ICD-10-CM | POA: Diagnosis not present

## 2015-08-22 DIAGNOSIS — M533 Sacrococcygeal disorders, not elsewhere classified: Secondary | ICD-10-CM

## 2015-08-22 DIAGNOSIS — M5136 Other intervertebral disc degeneration, lumbar region: Secondary | ICD-10-CM | POA: Diagnosis not present

## 2015-08-22 DIAGNOSIS — M5416 Radiculopathy, lumbar region: Secondary | ICD-10-CM

## 2015-08-22 DIAGNOSIS — Z981 Arthrodesis status: Secondary | ICD-10-CM

## 2015-08-22 MED ORDER — MIDAZOLAM HCL 5 MG/5ML IJ SOLN
INTRAMUSCULAR | Status: AC
  Administered 2015-08-22: 5 mg via INTRAVENOUS
  Filled 2015-08-22: qty 5

## 2015-08-22 MED ORDER — CEFAZOLIN SODIUM 1-5 GM-% IV SOLN: 1.0000 g | Freq: Once | INTRAVENOUS | Status: DC

## 2015-08-22 MED ORDER — FENTANYL CITRATE (PF) 100 MCG/2ML IJ SOLN: 100.0000 ug | Freq: Once | INTRAMUSCULAR | Status: AC

## 2015-08-22 MED ORDER — CEFUROXIME AXETIL 250 MG PO TABS: 250.0000 mg | ORAL_TABLET | Freq: Two times a day (BID) | ORAL | Status: DC

## 2015-08-22 MED ORDER — BUPIVACAINE HCL (PF) 0.25 % IJ SOLN
INTRAMUSCULAR | Status: AC
  Filled 2015-08-22: qty 30

## 2015-08-22 MED ORDER — FENTANYL CITRATE (PF) 100 MCG/2ML IJ SOLN
INTRAMUSCULAR | Status: AC
  Administered 2015-08-22: 100 ug via INTRAVENOUS
  Filled 2015-08-22: qty 2

## 2015-08-22 MED ORDER — TRIAMCINOLONE ACETONIDE 40 MG/ML IJ SUSP
INTRAMUSCULAR | Status: AC
  Filled 2015-08-22: qty 1

## 2015-08-22 MED ORDER — BUPIVACAINE HCL (PF) 0.25 % IJ SOLN: 30.0000 mL | Freq: Once | INTRAMUSCULAR | Status: AC

## 2015-08-22 MED ORDER — LIDOCAINE HCL (PF) 1 % IJ SOLN: 10.0000 mL | Freq: Once | INTRAMUSCULAR | Status: DC

## 2015-08-22 MED ORDER — ORPHENADRINE CITRATE 30 MG/ML IJ SOLN
INTRAMUSCULAR | Status: AC
  Filled 2015-08-22: qty 2

## 2015-08-22 MED ORDER — CEFAZOLIN SODIUM 1 G IJ SOLR
INTRAMUSCULAR | Status: AC
  Administered 2015-08-22: 1 g via INTRAVENOUS
  Filled 2015-08-22: qty 10

## 2015-08-22 MED ORDER — LACTATED RINGERS IV SOLN: 1000.0000 mL | INTRAVENOUS | Status: DC

## 2015-08-22 MED ORDER — TRIAMCINOLONE ACETONIDE 40 MG/ML IJ SUSP: 40.0000 mg | Freq: Once | INTRAMUSCULAR | Status: AC

## 2015-08-22 MED ORDER — ORPHENADRINE CITRATE 30 MG/ML IJ SOLN: 60.0000 mg | Freq: Once | INTRAMUSCULAR | Status: AC

## 2015-08-22 MED ORDER — MIDAZOLAM HCL 5 MG/5ML IJ SOLN: 5.0000 mg | Freq: Once | INTRAMUSCULAR | Status: AC

## 2015-08-22 NOTE — Patient Instructions (Addendum)
PLAN   Continue present medications for now as discussed . Please obtain Ceftin antibiotic and begin taking Ceftin antibiotic today as prescribed  F/U PCP  S Amin  for evaliation of  BP and general medical  condition  F/U surgical evaluation. Follow-up with Dr. Sherley Bounds as discussed  F/U neurological evaluation. May consider pending follow-up evaluations  May consider radiofrequency rhizolysis or intraspinal procedures pending response to present treatment and F/U evaluation   Pain Management Discharge Instructions  General Discharge Instructions :  If you need to reach your doctor call: Monday-Friday 8:00 am - 4:00 pm at [phone] or toll free [phone].  After clinic hours [phone] to have operator reach doctor.  Bring all of your medication bottles to all your appointments in the pain clinic.  To cancel or reschedule your appointment with Pain Management please remember to call 24 hours in advance to avoid a fee.  Refer to the educational materials which you have been given on: General Risks, I had my Procedure. Discharge Instructions, Post Sedation.  Post Procedure Instructions:  The drugs you were given will stay in your system until tomorrow, so for the next 24 hours you should not drive, make any legal decisions or drink any alcoholic beverages.  You may eat anything you prefer, but it is better to start with liquids then soups and crackers, and gradually work up to solid foods.  Please notify your doctor immediately if you have any unusual bleeding, trouble breathing or pain that is not related to your normal pain.  Depending on the type of procedure that was done, some parts of your body may feel week and/or numb.  This usually clears up by tonight or the next day.  Walk with the use of an assistive device or accompanied by an adult for the 24 hours.  You may use ice on the affected area for the first 24 hours.  Put ice in a Ziploc bag and cover with a  towel and place against area 15 minutes on 15 minutes off.  You may switch to heat after 24 hours.  A prescription for CEFTIN was sent to your pharmacy and should be available for pickup today.

## 2015-08-22 NOTE — Progress Notes (Signed)
Subjective:    Patient ID: Jill Robertson, female    DOB: May 26, 1961, 54 y.o.   MRN: [id number]  HPI  PROCEDURE PERFORMED: Lumbar facet (medial branch block)   NOTE: The patient is a 54 y.o. female who returns to [place] for further evaluation and treatment of pain involving the lumbar and lower extremity region.   The patient is with known degenerative changes of the lumbar spine and prior surgical intervention of the lumbar region   Period there is concern regarding significant color patient's pain being due to facet arthropathy with facet syndromeThe risks, benefits, and expectations of the procedure have been discussed and explained to the patient who was understanding and in agreement with suggested treatment plan. We will proceed with interventional treatment as discussed and as explained to the patient who was understanding and wished to proceed with procedure as planned.   DESCRIPTION OF PROCEDURE: Lumbar facet (medial branch block) with IV Versed, IV fentanyl conscious sedation, EKG, blood pressure, pulse, and pulse oximetry monitoring. The procedure was performed with the patient in the prone position. Betadine prep of proposed entry site performed.   NEEDLE PLACEMENT AT:  left L  3 lumbar facet (medial branch block). Under fluoroscopic guidance with oblique orientation of 15 degrees, a 22-gauge needle was inserted at the L  3 vertebral body level with needle placed at the targeted area of Burton's Eye or Eye of the Scotty Dog with documentation of needle placement in the superior and lateral border of targeted area of Burton's Eye or Eye of the Scotty Dog with oblique orientation of 15 degrees. Following documentation of needle placement at the L  3 vertebral body level, needle placement was then accomplished at the L  4 vertebral body level.   NEEDLE PLACEMENT AT  L4 and L5 VERTEBRAL BODY LEVELS ON THE LEFT SIDE The procedure was performed at the  L4 and L5 vertebral  body levels exactly as was performed at the L  3 vertebral body level utilizing the same technique and under fluoroscopic guidance.  NEEDLE PLACEMENT AT THE SACRAL ALA with AP view of the lumbosacral spine. With the patient in the prone position, Betadine prep of proposed entry site accomplished, a 22 gauge needle was inserted in the region of the sacral ala (groove formed by the superior articulating process of S1 and the sacral wing). Following documentation of needle placement at the sacral ala,  needle placement was then accomplished at the S1 foramen level.   NEEDLE PLACEMENT AT THE S1 FORAMEN LEVEL under fluoroscopic guidance with AP view of the lumbosacral spine and cephalad orientation of the fluoroscope, a 22-gauge needle was placed at the superior and lateral border of the S1 foramen under fluoroscopic guidance. Following documentation of needle placement at the S1 foramen.   Needle placement was then verified at all levels on lateral view. Following documentation of needle placement at all levels on lateral view and following negative aspiration for heme and CSF, each level was injected with 1 mL of 0.25% bupivacaine with Kenalog.     LUMBAR FACET, MEDIAL BRANCH NERVE, BLOCKS PERFORMED ON THE RIGHT SIDE   The procedure was performed on the right side exactly as was performed on the left side at the same levels and utilizing the same technique under fluoroscopic guidance.     The patient tolerated the procedure well. A total of 40 mg of Kenalog was utilized for the procedure.   PLAN:  1. Medications: The patient will continue presently  prescribed medications. 2. May consider modification of treatment regimen at time of return appointment pending response to treatment rendered on today's visit. 3. The patient is to follow-up with primary care physician   Dr.Amin  for further evaluation of blood pressure and general medical condition status post steroid injection performed on today's  visit. 4. Surgical follow-up evaluation  as discussed 5. Neurological follow-up evaluation. May consider [facility] EMG studies and other studies as discussed 6. The patient may be candidate for radiofrequency procedures, implantation type procedures, and other treatment pending response to treatment and follow-up evaluation. 7. The patient has been advised to call the Pain Management Center prior to scheduled return appointment should there be significant change in condition or should patient have other concerns regarding condition prior to scheduled return appointment.  The patient is understanding and in agreement with suggested treatment plan.     Review of Systems     Objective:   Physical Exam        Assessment & Plan:

## 2015-08-22 NOTE — Progress Notes (Signed)
Safety precautions to be maintained throughout the outpatient stay will include: orient to surroundings, keep bed in low position, maintain call bell within reach at all times, provide assistance with transfer out of bed and ambulation.  

## 2015-08-23 ENCOUNTER — Encounter: Payer: Self-pay | Admitting: Pain Medicine

## 2015-08-23 ENCOUNTER — Other Ambulatory Visit: Payer: Self-pay | Admitting: Pain Medicine

## 2015-08-23 ENCOUNTER — Telehealth: Payer: Self-pay | Admitting: *Deleted

## 2015-08-23 ENCOUNTER — Telehealth: Payer: Self-pay

## 2015-08-23 NOTE — Telephone Encounter (Signed)
Patient instructed that [facility] in [place] was escribed that script yesterday. May pick up now.

## 2015-08-23 NOTE — Telephone Encounter (Signed)
Left voice mail

## 2015-08-23 NOTE — Telephone Encounter (Signed)
Pt states the doctor was suppose to e-scribe ceftin and pt went to pharmacy but the prescription was not their

## 2015-08-26 ENCOUNTER — Encounter: Payer: Self-pay | Admitting: Pain Medicine

## 2015-08-29 DIAGNOSIS — M1712 Unilateral primary osteoarthritis, left knee: Secondary | ICD-10-CM | POA: Diagnosis not present

## 2015-08-29 DIAGNOSIS — M25562 Pain in left knee: Secondary | ICD-10-CM | POA: Diagnosis not present

## 2015-08-29 DIAGNOSIS — M549 Dorsalgia, unspecified: Secondary | ICD-10-CM | POA: Diagnosis not present

## 2015-08-29 DIAGNOSIS — F331 Major depressive disorder, recurrent, moderate: Secondary | ICD-10-CM | POA: Diagnosis not present

## 2015-08-29 DIAGNOSIS — Z79899 Other long term (current) drug therapy: Secondary | ICD-10-CM | POA: Diagnosis not present

## 2015-08-29 DIAGNOSIS — G894 Chronic pain syndrome: Secondary | ICD-10-CM | POA: Diagnosis not present

## 2015-09-02 ENCOUNTER — Encounter: Payer: Self-pay | Admitting: Pain Medicine

## 2015-09-06 DIAGNOSIS — F4310 Post-traumatic stress disorder, unspecified: Secondary | ICD-10-CM | POA: Diagnosis not present

## 2015-09-06 DIAGNOSIS — F39 Unspecified mood [affective] disorder: Secondary | ICD-10-CM | POA: Diagnosis not present

## 2015-09-06 DIAGNOSIS — F419 Anxiety disorder, unspecified: Secondary | ICD-10-CM | POA: Diagnosis not present

## 2015-09-06 DIAGNOSIS — F329 Major depressive disorder, single episode, unspecified: Secondary | ICD-10-CM | POA: Diagnosis not present

## 2015-09-08 ENCOUNTER — Ambulatory Visit: Payer: Medicare Other

## 2015-09-13 ENCOUNTER — Encounter: Payer: Self-pay | Admitting: Pain Medicine

## 2015-09-19 ENCOUNTER — Ambulatory Visit
Admission: RE | Admit: 2015-09-19 | Discharge: 2015-09-19 | Disposition: A | Discharge status: Home / Self Care | Payer: Medicare Other | Source: Ambulatory Visit | Attending: Pain Medicine | Admitting: Pain Medicine

## 2015-09-19 DIAGNOSIS — M545 Low back pain: Secondary | ICD-10-CM | POA: Insufficient documentation

## 2015-09-19 DIAGNOSIS — M5137 Other intervertebral disc degeneration, lumbosacral region: Secondary | ICD-10-CM | POA: Diagnosis not present

## 2015-09-19 DIAGNOSIS — M79605 Pain in left leg: Secondary | ICD-10-CM | POA: Insufficient documentation

## 2015-09-19 DIAGNOSIS — M5416 Radiculopathy, lumbar region: Secondary | ICD-10-CM

## 2015-09-19 DIAGNOSIS — M533 Sacrococcygeal disorders, not elsewhere classified: Secondary | ICD-10-CM

## 2015-09-19 DIAGNOSIS — M5136 Other intervertebral disc degeneration, lumbar region: Secondary | ICD-10-CM

## 2015-09-19 DIAGNOSIS — M79604 Pain in right leg: Secondary | ICD-10-CM | POA: Insufficient documentation

## 2015-09-19 DIAGNOSIS — Z981 Arthrodesis status: Secondary | ICD-10-CM

## 2015-09-19 DIAGNOSIS — M47816 Spondylosis without myelopathy or radiculopathy, lumbar region: Secondary | ICD-10-CM

## 2015-09-19 DIAGNOSIS — M5030 Other cervical disc degeneration, unspecified cervical region: Secondary | ICD-10-CM

## 2015-09-20 ENCOUNTER — Encounter: Payer: Self-pay | Admitting: Pain Medicine

## 2015-09-20 ENCOUNTER — Ambulatory Visit: Payer: Medicare Other | Attending: Pain Medicine | Admitting: Pain Medicine

## 2015-09-20 VITALS — BP 116/80 | HR 85 | Temp 97.7°F | Resp 16 | Ht 67.0 in | Wt 200.0 lb

## 2015-09-20 DIAGNOSIS — M533 Sacrococcygeal disorders, not elsewhere classified: Secondary | ICD-10-CM

## 2015-09-20 DIAGNOSIS — M791 Myalgia: Secondary | ICD-10-CM | POA: Diagnosis not present

## 2015-09-20 DIAGNOSIS — M5416 Radiculopathy, lumbar region: Secondary | ICD-10-CM

## 2015-09-20 DIAGNOSIS — Z981 Arthrodesis status: Secondary | ICD-10-CM | POA: Diagnosis not present

## 2015-09-20 DIAGNOSIS — M545 Low back pain: Secondary | ICD-10-CM | POA: Diagnosis present

## 2015-09-20 DIAGNOSIS — M79605 Pain in left leg: Secondary | ICD-10-CM | POA: Diagnosis present

## 2015-09-20 DIAGNOSIS — M5030 Other cervical disc degeneration, unspecified cervical region: Secondary | ICD-10-CM | POA: Diagnosis not present

## 2015-09-20 DIAGNOSIS — M5136 Other intervertebral disc degeneration, lumbar region: Secondary | ICD-10-CM | POA: Diagnosis not present

## 2015-09-20 DIAGNOSIS — M79604 Pain in right leg: Secondary | ICD-10-CM | POA: Diagnosis present

## 2015-09-20 DIAGNOSIS — M47817 Spondylosis without myelopathy or radiculopathy, lumbosacral region: Secondary | ICD-10-CM | POA: Diagnosis not present

## 2015-09-20 DIAGNOSIS — M47816 Spondylosis without myelopathy or radiculopathy, lumbar region: Secondary | ICD-10-CM

## 2015-09-20 DIAGNOSIS — M5126 Other intervertebral disc displacement, lumbar region: Secondary | ICD-10-CM | POA: Insufficient documentation

## 2015-09-20 MED ORDER — CYCLOBENZAPRINE HCL 10 MG PO TABS: ORAL_TABLET | ORAL | Status: DC

## 2015-09-20 MED ORDER — OXYCODONE HCL 10 MG PO TABS: ORAL_TABLET | ORAL | Status: DC

## 2015-09-20 NOTE — Progress Notes (Signed)
   Subjective:    Patient ID: Jill Robertson, female    DOB: 1961/07/06, 54 y.o.   MRN: [id number]  HPI Patient is a 54 year old female who returns to pain management Center for further evaluation and treatment of pain involving the lower back and lower extremity region predominantly with pain occurring in the region of the neck of lesser degree. On today's visit we reviewed patient's recent MRI and discussed patient's symptoms in relation to MRI of the lumbar spine. At the present time patient appears to be with pain due to significant component of sacroiliac joint dysfunction. Patient also with evidence of lumbar facet syndrome of significant degree. We will prescribe medications consisting of oxycodone and Flexeril and will proceed with interventional treatment consisting of block of nerves to the sacroiliac joint at time return appointment. The patient was understanding and agreement suggested treatment plan.      Review of Systems     Objective:   Physical Exam  There was tenderness to palpation of the splenius capitis and a separate talus musculature region of minimal degree. Patient appeared to be unremarkable Spurling's maneuver. There was tends to palpation over the cervical facet cervical paraspinal must reason a mild degree. Patient appeared to be with slightly decreased grip strength and Tinel and Phalen's maneuver were without increased pain of significant degree. There was no crepitus of the thoracic region noted. Palpation of the lower thoracic paraspinal must reason was with evidence of moderate muscle spasms. Palpation over the lumbar paraspinal muscles lumbar facet region was with moderate muscle spasms. Lateral bending rotation extension and palpation of the lumbar facets reproduce moderate discomfort. There was moderate tenderness to palpation of moderate severe tenderness to palpation over the PSIS and PII S region as well as the gluteal and piriformis musculature region.  Straight leg raising was tolerated to possibly 20 without increased pain with dorsiflexion noted. There was mild just palpation greater trochanteric region iliotibial band region. DTRs were difficult to elicit patient had difficulty relaxing. Abdomen nontender with no costovertebral tenderness noted.      Assessment & Plan:    Degenerative disc disease lumbar spine L3 for disc desiccation. L4-L5 disc bulging. L5-S1 disc space narrowing with endplate spurring and cervical region disc bulging with progression of findings since prior study of 2015  Sacroiliac joint dysfunction  Degenerative disc disease lumbar spine  Lumbar facet syndrome  Degenerative disc disease cervical spine Status post C4-C7 ACDF    PLAN  Continue present medications for now as discussed. Oxycodone and Flexeril prescribed for patient today  Block of nerves to the sacroiliac joint to be performed at time return appointment  F/U PCP  S Amin  for evaliation of  BP and general medical  condition  F/U surgical evaluation. Follow-up with Dr. Sherley Bounds as discussed  F/U psych evaluation as discussed  F/U neurological evaluation. May consider pending follow-up evaluations  May consider radiofrequency rhizolysis or intraspinal procedures pending response to present treatment and F/U evaluation   Patient to call Pain Management Center should patient have concerns prior to scheduled return appointment

## 2015-09-20 NOTE — Progress Notes (Signed)
Safety precautions to be maintained throughout the outpatient stay will include: orient to surroundings, keep bed in low position, maintain call bell within reach at all times, provide assistance with transfer out of bed and ambulation.  

## 2015-09-20 NOTE — Patient Instructions (Addendum)
PLAN   Continue present medications for now as discussed  Block of nerves to the sacroiliac joint to be performed at time return appointment  F/U PCP  S Amin  for evaliation of  BP and general medical  condition  F/U surgical evaluation. Follow-up with Dr. Sherley Bounds as discussed  F/U psych evaluation as discussed  F/U neurological evaluation. May consider pending follow-up evaluations  May consider radiofrequency rhizolysis or intraspinal procedures pending response to present treatment and F/U evaluation   Patient to call Pain Management Center should patient have concerns prior to scheduled return appointment.GENERAL RISKS AND COMPLICATIONS  What are the risk, side effects and possible complications? Generally speaking, most procedures are safe.  However, with any procedure there are risks, side effects, and the possibility of complications.  The risks and complications are dependent upon the sites that are lesioned, or the type of nerve block to be performed.  The closer the procedure is to the spine, the more serious the risks are.  Great care is taken when placing the radio frequency needles, block needles or lesioning probes, but sometimes complications can occur. 1. Infection: Any time there is an injection through the skin, there is a risk of infection.  This is why sterile conditions are used for these blocks.  There are four possible types of infection. 1. Localized skin infection. 2. Central Nervous System Infection-This can be in the form of Meningitis, which can be deadly. 3. Epidural Infections-This can be in the form of an epidural abscess, which can cause pressure inside of the spine, causing compression of the spinal cord with subsequent paralysis. This would require an emergency surgery to decompress, and there are no guarantees that the patient would recover from the paralysis. 4. Discitis-This is an infection of the intervertebral discs.  It occurs in about 1% of  discography procedures.  It is difficult to treat and it may lead to surgery.        2. Pain: the needles have to go through skin and soft tissues, will cause soreness.       3. Damage to internal structures:  The nerves to be lesioned may be near blood vessels or    other nerves which can be potentially damaged.       4. Bleeding: Bleeding is more common if the patient is taking blood thinners such as  aspirin, Coumadin, Ticiid, Plavix, etc., or if he/she have some genetic predisposition  such as hemophilia. Bleeding into the spinal canal can cause compression of the spinal  cord with subsequent paralysis.  This would require an emergency surgery to  decompress and there are no guarantees that the patient would recover from the  paralysis.       5. Pneumothorax:  Puncturing of a lung is a possibility, every time a needle is introduced in  the area of the chest or upper back.  Pneumothorax refers to free air around the  collapsed lung(s), inside of the thoracic cavity (chest cavity).  Another two possible  complications related to a similar event would include: Hemothorax and Chylothorax.   These are variations of the Pneumothorax, where instead of air around the collapsed  lung(s), you may have blood or chyle, respectively.       6. Spinal headaches: They may occur with any procedures in the area of the spine.       7. Persistent CSF (Cerebro-Spinal Fluid) leakage: This is a rare problem, but may occur  with prolonged intrathecal or epidural  catheters either due to the formation of a fistulous  track or a dural tear.       8. Nerve damage: By working so close to the spinal cord, there is always a possibility of  nerve damage, which could be as serious as a permanent spinal cord injury with  paralysis.       9. Death:  Although rare, severe deadly allergic reactions known as "Anaphylactic  reaction" can occur to any of the medications used.      10. Worsening of the symptoms:  We can always make thing  worse.  What are the chances of something like this happening? Chances of any of this occuring are extremely low.  By statistics, you have more of a chance of getting killed in a motor vehicle accident: while driving to the hospital than any of the above occurring .  Nevertheless, you should be aware that they are possibilities.  In general, it is similar to taking a shower.  Everybody knows that you can slip, hit your head and get killed.  Does that mean that you should not shower again?  Nevertheless always keep in mind that statistics do not mean anything if you happen to be on the wrong side of them.  Even if a procedure has a 1 (one) in a 1,000,000 (million) chance of going wrong, it you happen to be that one..Also, keep in mind that by statistics, you have more of a chance of having something go wrong when taking medications.  Who should not have this procedure? If you are on a blood thinning medication (e.g. Coumadin, Plavix, see list of "Blood Thinners"), or if you have an active infection going on, you should not have the procedure.  If you are taking any blood thinners, please inform your physician.  How should I prepare for this procedure?  Do not eat or drink anything at least six hours prior to the procedure.  Bring a driver with you .  It cannot be a taxi.  Come accompanied by an adult that can drive you back, and that is strong enough to help you if your legs get weak or numb from the local anesthetic.  Take all of your medicines the morning of the procedure with just enough water to swallow them.  If you have diabetes, make sure that you are scheduled to have your procedure done first thing in the morning, whenever possible.  If you have diabetes, take only half of your insulin dose and notify our nurse that you have done so as soon as you arrive at the clinic.  If you are diabetic, but only take blood sugar pills (oral hypoglycemic), then do not take them on the morning of your  procedure.  You may take them after you have had the procedure.  Do not take aspirin or any aspirin-containing medications, at least eleven (11) days prior to the procedure.  They may prolong bleeding.  Wear loose fitting clothing that may be easy to take off and that you would not mind if it got stained with Betadine or blood.  Do not wear any jewelry or perfume  Remove any nail coloring.  It will interfere with some of our monitoring equipment.  NOTE: Remember that this is not meant to be interpreted as a complete list of all possible complications.  Unforeseen problems may occur.  BLOOD THINNERS The following drugs contain aspirin or other products, which can cause increased bleeding during surgery and should not be taken  for 2 weeks prior to and 1 week after surgery.  If you should need take something for relief of minor pain, you may take acetaminophen which is found in Tylenol,m Datril, Anacin-3 and Panadol. It is not blood thinner. The products listed below are.  Do not take any of the products listed below in addition to any listed on your instruction sheet.  A.P.C or A.P.C with Codeine Codeine Phosphate Capsules #3 Ibuprofen Ridaura  ABC compound Congesprin Imuran rimadil  Advil Cope Indocin Robaxisal  Alka-Seltzer Effervescent Pain Reliever and Antacid Coricidin or Coricidin-D  Indomethacin Rufen  Alka-Seltzer plus Cold Medicine Cosprin Ketoprofen S-A-C Tablets  Anacin Analgesic Tablets or Capsules Coumadin Korlgesic Salflex  Anacin Extra Strength Analgesic tablets or capsules CP-2 Tablets Lanoril Salicylate  Anaprox Cuprimine Capsules Levenox Salocol  Anexsia-D Dalteparin Magan Salsalate  Anodynos Darvon compound Magnesium Salicylate Sine-off  Ansaid Dasin Capsules Magsal Sodium Salicylate  Anturane Depen Capsules Marnal Soma  APF Arthritis pain formula Dewitt's Pills Measurin Stanback  Argesic Dia-Gesic Meclofenamic Sulfinpyrazone  Arthritis Bayer Timed Release Aspirin  Diclofenac Meclomen Sulindac  Arthritis pain formula Anacin Dicumarol Medipren Supac  Analgesic (Safety coated) Arthralgen Diffunasal Mefanamic Suprofen  Arthritis Strength Bufferin Dihydrocodeine Mepro Compound Suprol  Arthropan liquid Dopirydamole Methcarbomol with Aspirin Synalgos  ASA tablets/Enseals Disalcid Micrainin Tagament  Ascriptin Doan's Midol Talwin  Ascriptin A/D Dolene Mobidin Tanderil  Ascriptin Extra Strength Dolobid Moblgesic Ticlid  Ascriptin with Codeine Doloprin or Doloprin with Codeine Momentum Tolectin  Asperbuf Duoprin Mono-gesic Trendar  Aspergum Duradyne Motrin or Motrin IB Triminicin  Aspirin plain, buffered or enteric coated Durasal Myochrisine Trigesic  Aspirin Suppositories Easprin Nalfon Trillsate  Aspirin with Codeine Ecotrin Regular or Extra Strength Naprosyn Uracel  Atromid-S Efficin Naproxen Ursinus  Auranofin Capsules Elmiron Neocylate Vanquish  Axotal Emagrin Norgesic Verin  Azathioprine Empirin or Empirin with Codeine Normiflo Vitamin E  Azolid Emprazil Nuprin Voltaren  Bayer Aspirin plain, buffered or children's or timed BC Tablets or powders Encaprin Orgaran Warfarin Sodium  Buff-a-Comp Enoxaparin Orudis Zorpin  Buff-a-Comp with Codeine Equegesic Os-Cal-Gesic   Buffaprin Excedrin plain, buffered or Extra Strength Oxalid   Bufferin Arthritis Strength Feldene Oxphenbutazone   Bufferin plain or Extra Strength Feldene Capsules Oxycodone with Aspirin   Bufferin with Codeine Fenoprofen Fenoprofen Pabalate or Pabalate-SF   Buffets II Flogesic Panagesic   Buffinol plain or Extra Strength Florinal or Florinal with Codeine Panwarfarin   Buf-Tabs Flurbiprofen Penicillamine   Butalbital Compound Four-way cold tablets Penicillin   Butazolidin Fragmin Pepto-Bismol   Carbenicillin Geminisyn Percodan   Carna Arthritis Reliever Geopen Persantine   Carprofen Gold's salt Persistin   Chloramphenicol Goody's Phenylbutazone   Chloromycetin Haltrain Piroxlcam    Clmetidine heparin Plaquenil   Cllnoril Hyco-pap Ponstel   Clofibrate Hydroxy chloroquine Propoxyphen         Before stopping any of these medications, be sure to consult the physician who ordered them.  Some, such as Coumadin (Warfarin) are ordered to prevent or treat serious conditions such as "deep thrombosis", "pumonary embolisms", and other heart problems.  The amount of time that you may need off of the medication may also vary with the medication and the reason for which you were taking it.  If you are taking any of these medications, please make sure you notify your pain physician before you undergo any procedures.         Sacroiliac (SI) Joint Injection Patient Information  Description: The sacroiliac joint connects the scrum (very low back and tailbone) to the ilium (a  pelvic bone which also forms half of the hip joint).  Normally this joint experiences very little motion.  When this joint becomes inflamed or unstable low back and or hip and pelvis pain may result.  Injection of this joint with local anesthetics (numbing medicines) and steroids can provide diagnostic information and reduce pain.  This injection is performed with the aid of x-ray guidance into the tailbone area while you are lying on your stomach.   You may experience an electrical sensation down the leg while this is being done.  You may also experience numbness.  We also may ask if we are reproducing your normal pain during the injection.  Conditions which may be treated SI injection:   Low back, buttock, hip or leg pain  Preparation for the Injection:  1. Do not eat any solid food or dairy products within 6 hours of your appointment.  2. You may drink clear liquids up to 2 hours before appointment.  Clear liquids include water, black coffee, juice or soda.  No milk or cream please. 3. You may take your regular medications, including pain medications with a sip of water before your appointment.  Diabetics  should hold regular insulin (if take separately) and take 1/2 normal NPH dose the morning of the procedure.  Carry some sugar containing items with you to your appointment. 4. A driver must accompany you and be prepared to drive you home after your procedure. 5. Bring all of your current medications with you. 6. An IV may be inserted and sedation may be given at the discretion of the physician. 7. A blood pressure cuff, EKG and other monitors will often be applied during the procedure.  Some patients may need to have extra oxygen administered for a short period.  8. You will be asked to provide medical information, including your allergies, prior to the procedure.  We must know immediately if you are taking blood thinners (like Coumadin/Warfarin) or if you are allergic to IV iodine contrast (dye).  We must know if you could possible be pregnant.  Possible side effects:   Bleeding from needle site  Infection (rare, may require surgery)  Nerve injury (rare)  Numbness & tingling (temporary)  A brief convulsion or seizure  Light-headedness (temporary)  Pain at injection site (several days)  Decreased blood pressure (temporary)  Weakness in the leg (temporary)   Call if you experience:   New onset weakness or numbness of an extremity below the injection site that last more than 8 hours.  Hives or difficulty breathing ( go to the emergency room)  Inflammation or drainage at the injection site  Any new symptoms which are concerning to you  Please note:  Although the local anesthetic injected can often make your back/ hip/ buttock/ leg feel good for several hours after the injections, the pain will likely return.  It takes 3-7 days for steroids to work in the sacroiliac area.  You may not notice any pain relief for at least that one week.  If effective, we will often do a series of three injections spaced 3-6 weeks apart to maximally decrease your pain.  After the initial series,  we generally will wait some months before a repeat injection of the same type.  If you have any questions, please call [phone] [facility]

## 2015-09-21 DIAGNOSIS — G894 Chronic pain syndrome: Secondary | ICD-10-CM | POA: Diagnosis not present

## 2015-09-21 DIAGNOSIS — I10 Essential (primary) hypertension: Secondary | ICD-10-CM | POA: Diagnosis not present

## 2015-09-21 DIAGNOSIS — M549 Dorsalgia, unspecified: Secondary | ICD-10-CM | POA: Diagnosis not present

## 2015-09-21 DIAGNOSIS — F331 Major depressive disorder, recurrent, moderate: Secondary | ICD-10-CM | POA: Diagnosis not present

## 2015-10-05 ENCOUNTER — Encounter: Payer: Self-pay | Admitting: Pain Medicine

## 2015-10-05 ENCOUNTER — Ambulatory Visit: Payer: Medicare Other | Attending: Pain Medicine | Admitting: Pain Medicine

## 2015-10-05 VITALS — BP 111/66 | HR 76 | Temp 98.7°F | Resp 16 | Ht 68.0 in | Wt 202.0 lb

## 2015-10-05 DIAGNOSIS — Z981 Arthrodesis status: Secondary | ICD-10-CM

## 2015-10-05 DIAGNOSIS — M533 Sacrococcygeal disorders, not elsewhere classified: Secondary | ICD-10-CM

## 2015-10-05 DIAGNOSIS — M79604 Pain in right leg: Secondary | ICD-10-CM | POA: Diagnosis present

## 2015-10-05 DIAGNOSIS — M5136 Other intervertebral disc degeneration, lumbar region: Secondary | ICD-10-CM

## 2015-10-05 DIAGNOSIS — M47816 Spondylosis without myelopathy or radiculopathy, lumbar region: Secondary | ICD-10-CM

## 2015-10-05 DIAGNOSIS — M5416 Radiculopathy, lumbar region: Secondary | ICD-10-CM

## 2015-10-05 DIAGNOSIS — M5126 Other intervertebral disc displacement, lumbar region: Secondary | ICD-10-CM | POA: Diagnosis not present

## 2015-10-05 DIAGNOSIS — M47817 Spondylosis without myelopathy or radiculopathy, lumbosacral region: Secondary | ICD-10-CM | POA: Diagnosis not present

## 2015-10-05 DIAGNOSIS — M545 Low back pain: Secondary | ICD-10-CM | POA: Diagnosis not present

## 2015-10-05 DIAGNOSIS — M5030 Other cervical disc degeneration, unspecified cervical region: Secondary | ICD-10-CM

## 2015-10-05 DIAGNOSIS — M79605 Pain in left leg: Secondary | ICD-10-CM | POA: Diagnosis present

## 2015-10-05 MED ORDER — CEFUROXIME AXETIL 250 MG PO TABS: 250.0000 mg | ORAL_TABLET | Freq: Two times a day (BID) | ORAL | Status: DC

## 2015-10-05 MED ORDER — CEFAZOLIN SODIUM 1-5 GM-% IV SOLN: 1.0000 g | Freq: Once | INTRAVENOUS | Status: DC

## 2015-10-05 MED ORDER — BUPIVACAINE HCL (PF) 0.25 % IJ SOLN
30.0000 mL | Freq: Once | INTRAMUSCULAR | Status: DC
Start: 2015-10-05 — End: 2023-01-21

## 2015-10-05 MED ORDER — LACTATED RINGERS IV SOLN: 1000.0000 mL | INTRAVENOUS | Status: DC

## 2015-10-05 MED ORDER — BUPIVACAINE HCL (PF) 0.25 % IJ SOLN
INTRAMUSCULAR | Status: AC
  Administered 2015-10-05: 14:00:00
  Filled 2015-10-05: qty 30

## 2015-10-05 MED ORDER — MIDAZOLAM HCL 5 MG/5ML IJ SOLN
INTRAMUSCULAR | Status: AC
  Administered 2015-10-05: 5 mg via INTRAVENOUS
  Filled 2015-10-05: qty 5

## 2015-10-05 MED ORDER — FENTANYL CITRATE (PF) 100 MCG/2ML IJ SOLN: 100.0000 ug | Freq: Once | INTRAMUSCULAR | Status: DC

## 2015-10-05 MED ORDER — TRIAMCINOLONE ACETONIDE 40 MG/ML IJ SUSP: 40.0000 mg | Freq: Once | INTRAMUSCULAR | Status: DC

## 2015-10-05 MED ORDER — TRIAMCINOLONE ACETONIDE 40 MG/ML IJ SUSP
INTRAMUSCULAR | Status: AC
  Administered 2015-10-05: 14:00:00
  Filled 2015-10-05: qty 1

## 2015-10-05 MED ORDER — ORPHENADRINE CITRATE 30 MG/ML IJ SOLN: 60.0000 mg | Freq: Once | INTRAMUSCULAR | Status: DC

## 2015-10-05 MED ORDER — MIDAZOLAM HCL 5 MG/5ML IJ SOLN: 5.0000 mg | Freq: Once | INTRAMUSCULAR | Status: DC

## 2015-10-05 MED ORDER — FENTANYL CITRATE (PF) 100 MCG/2ML IJ SOLN
INTRAMUSCULAR | Status: AC
  Administered 2015-10-05: 100 ug via INTRAVENOUS
  Filled 2015-10-05: qty 2

## 2015-10-05 NOTE — Progress Notes (Signed)
Subjective:    Patient ID: Jill Robertson, female    DOB: [birth year], 54 y.o.   MRN: [id number]  HPI  PROCEDURE:  Block of nerves to the sacroiliac joint.   NOTE:  The patient is a 54 y.o. female who returns to the Pain Management Center for further evaluation and treatment of pain involving the lower back and lower extremity region with pain in the region of the buttocks as well. Prior MRI studies reveal Degenerative disc disease lumbar spine L3 for disc desiccation. L4-L5 disc bulging. L5-S1 disc space narrowing with endplate spurring and cervical region disc bulging with progression of findings since prior study of 201 the patient is with severe increase of pain with palpation over the PSIS NP IIS regions and is with positive Patrick's maneuver.   There is concern regarding a significant component of the patient's pain being due to sacroiliac joint dysfunction The risks, benefits, expectations of the procedure have been discussed and explained to the patient who is understanding and willing to proceed with interventional treatment in attempt to decrease severity of patient's symptoms, minimize the risk of medication escalation and  hopefully retard the progression of the patient's symptoms. We will proceed with what is felt to be a medically necessary procedure, block of nerves to the sacroiliac joint.   DESCRIPTION OF PROCEDURE:  Block of nerves to the sacroiliac joint.   The patient was taken to the fluoroscopy suite. With the patient in the prone position with EKG, blood pressure, pulse and pulse oximetry monitoring, IV Versed, IV fentanyl conscious sedation, Betadine prep of proposed entry site was performed.   Block of nerves at the L5 vertebral body level.   With the patient in prone position, under fluoroscopic guidance, a 22 -gauge needle was inserted at the L5 vertebral body level on the left side. With 15 degrees oblique orientation a 22 -gauge needle was inserted in the region known  as Burton's eye or eye of the Scotty dog. Following documentation of needle placement in the area of Burton's eye or eye of the Scotty dog under fluoroscopic guidance, needle placement was then accomplished at the sacral ala level on the left side.   Needle placement at the sacral ala.   With the patient in prone position under fluoroscopic guidance with AP view of the lumbosacral spine, a 22 -gauge needle was inserted in the region known as the sacral ala on the left side. Following documentation of needle placement on the left side under fluoroscopic guidance needle placement was then accomplished at the S1 foramen level.   Needle placement at the S1 foramen level.   With the patient in prone position under fluoroscopic guidance with AP view of the lumbosacral spine and cephalad orientation, a 22 -gauge needle was inserted at the superior and lateral border of the S1 foramen on the left side. Following documentation of needle placement at the S1 foramen level on the left side, needle placement was then accomplished at the S2 foramen level on the left side.   Needle placement at the S2 foramen level.   With the patient in prone position with AP view of the lumbosacral spine with cephalad orientation, a 22 - gauge needle was inserted at the superior and lateral border of the S2 foramen under fluoroscopic guidance on the left side. Following needle placement at the L5 vertebral body level, sacral ala, S1 foramen and S2 foramen on the left side, needle placement was verified on lateral view under fluoroscopic guidance.  Following  needle placement documentation on lateral view, each needle was injected with 1 mL of 0.25% bupivacaine and Kenalog.   BLOCK OF THE NERVES TO SACROILIAC JOINT ON THE RIGHT SIDE The procedure was performed on the right side at the same levels as was performed on the left side and utilizing the same technique as on the left side and was performed under fluoroscopic guidance as  on the left side   A total of 10mg  of Kenalog was utilized for the procedure.   PLAN:  1. Medications: The patient will continue presently prescribed medications. Flexeril and oxycodone  2. The patient will be considered for modification of treatment regimen pending response to the procedure performed on today's visit.  3. The patient is to follow-up with primary care physician Dr.  Reesa Chew for evaluation of blood pressure and general medical condition following the procedure performed on today's visit.  4. Surgical evaluation as discussed.  5. Neurological evaluation as discussed.  6. The patient may be a candidate for radiofrequency procedures, implantation devices and other treatment pending response to treatment performed on today's visit and follow-up evaluation.  7. The patient has been advised to adhere to proper body mechanics and to avoid activities which may exacerbate the patient's symptoms.   Return appointment to Pain Management Center as scheduled.    Review of Systems     Objective:   Physical Exam        Assessment & Plan:

## 2015-10-05 NOTE — Patient Instructions (Addendum)
PLAN   Continue present medications for now as discussed . Please obtain Ceftin antibiotic and begin taking Ceftin antibiotic today as prescribed  F/U PCP  S Amin  for evaliation of  BP and general medical  condition  F/U surgical evaluation. Follow-up with Dr. Sherley Bounds as discussed  F/U neurological evaluation. May consider pending follow-up evaluations  May consider radiofrequency rhizolysis or intraspinal procedures pending response to present treatment and F/U evaluation Sacroiliac (SI) Joint Injection Patient Information  Description: The sacroiliac joint connects the scrum (very low back and tailbone) to the ilium (a pelvic bone which also forms half of the hip joint).  Normally this joint experiences very little motion.  When this joint becomes inflamed or unstable low back and or hip and pelvis pain may result.  Injection of this joint with local anesthetics (numbing medicines) and steroids can provide diagnostic information and reduce pain.  This injection is performed with the aid of x-ray guidance into the tailbone area while you are lying on your stomach.   You may experience an electrical sensation down the leg while this is being done.  You may also experience numbness.  We also may ask if we are reproducing your normal pain during the injection.  Conditions which may be treated SI injection:   Low back, buttock, hip or leg pain  Preparation for the Injection:  1. Do not eat any solid food or dairy products within 6 hours of your appointment.  2. You may drink clear liquids up to 2 hours before appointment.  Clear liquids include water, black coffee, juice or soda.  No milk or cream please. 3. You may take your regular medications, including pain medications with a sip of water before your appointment.  Diabetics should hold regular insulin (if take separately) and take 1/2 normal NPH dose the morning of the procedure.  Carry some sugar containing items with you to your  appointment. 4. A driver must accompany you and be prepared to drive you home after your procedure. 5. Bring all of your current medications with you. 6. An IV may be inserted and sedation may be given at the discretion of the physician. 7. A blood pressure cuff, EKG and other monitors will often be applied during the procedure.  Some patients may need to have extra oxygen administered for a short period.  8. You will be asked to provide medical information, including your allergies, prior to the procedure.  We must know immediately if you are taking blood thinners (like Coumadin/Warfarin) or if you are allergic to IV iodine contrast (dye).  We must know if you could possible be pregnant.  Possible side effects:   Bleeding from needle site  Infection (rare, may require surgery)  Nerve injury (rare)  Numbness & tingling (temporary)  A brief convulsion or seizure  Light-headedness (temporary)  Pain at injection site (several days)  Decreased blood pressure (temporary)  Weakness in the leg (temporary)   Call if you experience:   New onset weakness or numbness of an extremity below the injection site that last more than 8 hours.  Hives or difficulty breathing ( go to the emergency room)  Inflammation or drainage at the injection site  Any new symptoms which are concerning to you  Please note:  Although the local anesthetic injected can often make your back/ hip/ buttock/ leg feel good for several hours after the injections, the pain will likely return.  It takes 3-7 days for steroids to work in the  sacroiliac area.  You may not notice any pain relief for at least that one week.  If effective, we will often do a series of three injections spaced 3-6 weeks apart to maximally decrease your pain.  After the initial series, we generally will wait some months before a repeat injection of the same type.  If you have any questions, please call [phone] [facility]  Pain Management Discharge Instructions  General Discharge Instructions :  If you need to reach your doctor call: Monday-Friday 8:00 am - 4:00 pm at [phone] or toll free [phone].  After clinic hours [phone] to have operator reach doctor.  Bring all of your medication bottles to all your appointments in the pain clinic.  To cancel or reschedule your appointment with Pain Management please remember to call 24 hours in advance to avoid a fee.  Refer to the educational materials which you have been given on: General Risks, I had my Procedure. Discharge Instructions, Post Sedation.  Post Procedure Instructions:  The drugs you were given will stay in your system until tomorrow, so for the next 24 hours you should not drive, make any legal decisions or drink any alcoholic beverages.  You may eat anything you prefer, but it is better to start with liquids then soups and crackers, and gradually work up to solid foods.  Please notify your doctor immediately if you have any unusual bleeding, trouble breathing or pain that is not related to your normal pain.  Depending on the type of procedure that was done, some parts of your body may feel week and/or numb.  This usually clears up by tonight or the next day.  Walk with the use of an assistive device or accompanied by an adult for the 24 hours.  You may use ice on the affected area for the first 24 hours.  Put ice in a Ziploc bag and cover with a towel and place against area 15 minutes on 15 minutes off.  You may switch to heat after 24 hours.

## 2015-10-05 NOTE — Progress Notes (Signed)
Safety precautions to be maintained throughout the outpatient stay will include: orient to surroundings, keep bed in low position, maintain call bell within reach at all times, provide assistance with transfer out of bed and ambulation.  

## 2015-10-06 ENCOUNTER — Telehealth: Payer: Self-pay | Admitting: *Deleted

## 2015-10-06 NOTE — Telephone Encounter (Signed)
No problems post procedure. 

## 2015-10-20 ENCOUNTER — Encounter: Payer: Self-pay | Admitting: Pain Medicine

## 2015-10-20 ENCOUNTER — Ambulatory Visit: Payer: Medicare Other | Attending: Pain Medicine | Admitting: Pain Medicine

## 2015-10-20 VITALS — BP 106/69 | HR 114 | Temp 98.2°F | Resp 16 | Wt 200.0 lb

## 2015-10-20 DIAGNOSIS — M5416 Radiculopathy, lumbar region: Secondary | ICD-10-CM

## 2015-10-20 DIAGNOSIS — Z981 Arthrodesis status: Secondary | ICD-10-CM | POA: Diagnosis not present

## 2015-10-20 DIAGNOSIS — M5030 Other cervical disc degeneration, unspecified cervical region: Secondary | ICD-10-CM

## 2015-10-20 DIAGNOSIS — M5126 Other intervertebral disc displacement, lumbar region: Secondary | ICD-10-CM | POA: Insufficient documentation

## 2015-10-20 DIAGNOSIS — M79605 Pain in left leg: Secondary | ICD-10-CM | POA: Diagnosis present

## 2015-10-20 DIAGNOSIS — M545 Low back pain: Secondary | ICD-10-CM | POA: Diagnosis present

## 2015-10-20 DIAGNOSIS — M79604 Pain in right leg: Secondary | ICD-10-CM | POA: Diagnosis present

## 2015-10-20 DIAGNOSIS — M791 Myalgia: Secondary | ICD-10-CM | POA: Diagnosis not present

## 2015-10-20 DIAGNOSIS — M5136 Other intervertebral disc degeneration, lumbar region: Secondary | ICD-10-CM

## 2015-10-20 DIAGNOSIS — M47816 Spondylosis without myelopathy or radiculopathy, lumbar region: Secondary | ICD-10-CM

## 2015-10-20 DIAGNOSIS — M47817 Spondylosis without myelopathy or radiculopathy, lumbosacral region: Secondary | ICD-10-CM | POA: Diagnosis not present

## 2015-10-20 DIAGNOSIS — M533 Sacrococcygeal disorders, not elsewhere classified: Secondary | ICD-10-CM | POA: Insufficient documentation

## 2015-10-20 MED ORDER — OXYCODONE HCL 10 MG PO TABS: ORAL_TABLET | ORAL | Status: DC

## 2015-10-20 NOTE — Progress Notes (Signed)
Safety precautions to be maintained throughout the outpatient stay will include: orient to surroundings, keep bed in low position, maintain call bell within reach at all times, provide assistance with transfer out of bed and ambulation.  

## 2015-10-20 NOTE — Progress Notes (Signed)
Subjective:    Patient ID: Jill Robertson, female    DOB: 12-07-1960, 54 y.o.   MRN: [id number]  HPI  The patient is a 54 year old female who returns to pain management Center for further evaluation and treatment of pain involving the region of the lower back and lower extremity region predominantly. The patient states that her pain is improved with prior treatment performed in pain management Center and states that she does have significant pain of the lower thoracic region as well. The patient states the pain becomes more intense as the day progresses with lateral bending rotation and standing causing significant pain. The patient is without plans for additional surgery of the lumbar region at this time. The patient states she has had improvement with medications as well and that she has taken ibuprofen and naproxen. We have caution patient regarding the nonsteroidal anti-inflammatory medications and her advised patient to stop both ibuprofen and naproxen at this time and to follow-up with her primary care physician Dr.Amin to address her GI condition. We will continue present medications and will schedule patient for interventional treatment at time return appointment consisting of block of nerves to the sacroiliac joint. The patient is understanding and agreed to suggested treatment plan.     Review of Systems     Objective:   Physical Exam  There was tends to palpation of the paraspinal muscular region cervical region cervical facet region palpation which be produced pain of mild degree with mild tenderness over the splenius capitis and occipitalis musculature regions. There was well-healed surgical scar of the cervical region without increased warmth and erythema in the region of the scar Palpation of the acromioclavicular and glenohumeral joint regions reproduces minimal discomfort. Patient appeared to be unremarkable Spurling's maneuver and Tinel and Phalen's maneuver were without  increased pain of significant degree. Palpation over the region of the thoracic facet thoracic paraspinal musculature region was attends to palpation with no crepitus of the thoracic region noted. Palpation over the lumbar paraspinal must reason lumbar facet region was with moderate tends to palpation to moderately severe tenderness to palpation with lateral bending rotation extension and palpation of the lumbar facets reproducing moderately severe discomfort. There was tenderness to palpation over the PSIS and PII S region of moderately severe degree with mild tenderness of the greater trochanteric region and iliotibial band regions. Straight leg raise was tolerates approximately 20 without increased pain with dorsiflexion noted. DTRs were difficult to this patient had difficulty relaxing. There was negative clonus negative Homans. Abdomen was nontender with no costovertebral tenderness noted.      Assessment & Plan:     Degenerative disc disease lumbar spine L3 for disc desiccation. L4-L5 disc bulging. L5-S1 disc space narrowing with endplate spurring and cervical region disc bulging with progression of findings since prior study of 2015  Sacroiliac joint dysfunction  Degenerative disc disease lumbar spine  Lumbar facet syndrome  Degenerative disc disease cervical spine Status post C4-C7 ACDF    PLAN   Continue present medication Lyrica and oxycodone for now as discussed  NO NAPROXEN  NO IBUPROFEN  Block of nerves to the sacroiliac joint to be performed at time return appointment  F/U PCP  S Amin  for evaliation of  BP and general medical  condition  F/U surgical evaluation. Follow-up with Dr. Sherley Bounds as discussed  F/U psych evaluation as discussed  F/U neurological evaluation. May consider pending follow-up evaluations  May consider radiofrequency rhizolysis or intraspinal procedures pending response to present  treatment and F/U evaluation   Patient to call Pain  Management Center should patient have concerns prior to scheduled return appointment

## 2015-10-20 NOTE — Patient Instructions (Addendum)
PLAN   Continue present medication Lyrica and oxycodone for now as discussed  NO NAPROXEN  NO IBUPROFEN  Block of nerves to the sacroiliac joint to be performed at time return appointment  F/U PCP  S Amin  for evaliation of  BP and general medical  condition  F/U surgical evaluation. Follow-up with Dr. Sherley Bounds as discussed  F/U psych evaluation as discussed  F/U neurological evaluation. May consider pending follow-up evaluations  May consider radiofrequency rhizolysis or intraspinal procedures pending response to present treatment and F/U evaluation   Patient to call Pain Management Center should patient have concerns prior to scheduled return appointment.GENERAL RISKS AND COMPLICATIONS  What are the risk, side effects and possible complications? Generally speaking, most procedures are safe.  However, with any procedure there are risks, side effects, and the possibility of complications.  The risks and complications are dependent upon the sites that are lesioned, or the type of nerve block to be performed.  The closer the procedure is to the spine, the more serious the risks are.  Great care is taken when placing the radio frequency needles, block needles or lesioning probes, but sometimes complications can occur. 1. Infection: Any time there is an injection through the skin, there is a risk of infection.  This is why sterile conditions are used for these blocks.  There are four possible types of infection. 1. Localized skin infection. 2. Central Nervous System Infection-This can be in the form of Meningitis, which can be deadly. 3. Epidural Infections-This can be in the form of an epidural abscess, which can cause pressure inside of the spine, causing compression of the spinal cord with subsequent paralysis. This would require an emergency surgery to decompress, and there are no guarantees that the patient would recover from the paralysis. 4. Discitis-This is an infection of the  intervertebral discs.  It occurs in about 1% of discography procedures.  It is difficult to treat and it may lead to surgery.        2. Pain: the needles have to go through skin and soft tissues, will cause soreness.       3. Damage to internal structures:  The nerves to be lesioned may be near blood vessels or    other nerves which can be potentially damaged.       4. Bleeding: Bleeding is more common if the patient is taking blood thinners such as  aspirin, Coumadin, Ticiid, Plavix, etc., or if he/she have some genetic predisposition  such as hemophilia. Bleeding into the spinal canal can cause compression of the spinal  cord with subsequent paralysis.  This would require an emergency surgery to  decompress and there are no guarantees that the patient would recover from the  paralysis.       5. Pneumothorax:  Puncturing of a lung is a possibility, every time a needle is introduced in  the area of the chest or upper back.  Pneumothorax refers to free air around the  collapsed lung(s), inside of the thoracic cavity (chest cavity).  Another two possible  complications related to a similar event would include: Hemothorax and Chylothorax.   These are variations of the Pneumothorax, where instead of air around the collapsed  lung(s), you may have blood or chyle, respectively.       6. Spinal headaches: They may occur with any procedures in the area of the spine.       7. Persistent CSF (Cerebro-Spinal Fluid) leakage: This is a rare problem,  but may occur  with prolonged intrathecal or epidural catheters either due to the formation of a fistulous  track or a dural tear.       8. Nerve damage: By working so close to the spinal cord, there is always a possibility of  nerve damage, which could be as serious as a permanent spinal cord injury with  paralysis.       9. Death:  Although rare, severe deadly allergic reactions known as "Anaphylactic  reaction" can occur to any of the medications used.       10. Worsening of the symptoms:  We can always make thing worse.  What are the chances of something like this happening? Chances of any of this occuring are extremely low.  By statistics, you have more of a chance of getting killed in a motor vehicle accident: while driving to the hospital than any of the above occurring .  Nevertheless, you should be aware that they are possibilities.  In general, it is similar to taking a shower.  Everybody knows that you can slip, hit your head and get killed.  Does that mean that you should not shower again?  Nevertheless always keep in mind that statistics do not mean anything if you happen to be on the wrong side of them.  Even if a procedure has a 1 (one) in a 1,000,000 (million) chance of going wrong, it you happen to be that one..Also, keep in mind that by statistics, you have more of a chance of having something go wrong when taking medications.  Who should not have this procedure? If you are on a blood thinning medication (e.g. Coumadin, Plavix, see list of "Blood Thinners"), or if you have an active infection going on, you should not have the procedure.  If you are taking any blood thinners, please inform your physician.  How should I prepare for this procedure?  Do not eat or drink anything at least six hours prior to the procedure.  Bring a driver with you .  It cannot be a taxi.  Come accompanied by an adult that can drive you back, and that is strong enough to help you if your legs get weak or numb from the local anesthetic.  Take all of your medicines the morning of the procedure with just enough water to swallow them.  If you have diabetes, make sure that you are scheduled to have your procedure done first thing in the morning, whenever possible.  If you have diabetes, take only half of your insulin dose and notify our nurse that you have done so as soon as you arrive at the clinic.  If you are diabetic, but only take blood sugar pills (oral  hypoglycemic), then do not take them on the morning of your procedure.  You may take them after you have had the procedure.  Do not take aspirin or any aspirin-containing medications, at least eleven (11) days prior to the procedure.  They may prolong bleeding.  Wear loose fitting clothing that may be easy to take off and that you would not mind if it got stained with Betadine or blood.  Do not wear any jewelry or perfume  Remove any nail coloring.  It will interfere with some of our monitoring equipment.  NOTE: Remember that this is not meant to be interpreted as a complete list of all possible complications.  Unforeseen problems may occur.  BLOOD THINNERS The following drugs contain aspirin or other products, which can cause  increased bleeding during surgery and should not be taken for 2 weeks prior to and 1 week after surgery.  If you should need take something for relief of minor pain, you may take acetaminophen which is found in Tylenol,m Datril, Anacin-3 and Panadol. It is not blood thinner. The products listed below are.  Do not take any of the products listed below in addition to any listed on your instruction sheet.  A.P.C or A.P.C with Codeine Codeine Phosphate Capsules #3 Ibuprofen Ridaura  ABC compound Congesprin Imuran rimadil  Advil Cope Indocin Robaxisal  Alka-Seltzer Effervescent Pain Reliever and Antacid Coricidin or Coricidin-D  Indomethacin Rufen  Alka-Seltzer plus Cold Medicine Cosprin Ketoprofen S-A-C Tablets  Anacin Analgesic Tablets or Capsules Coumadin Korlgesic Salflex  Anacin Extra Strength Analgesic tablets or capsules CP-2 Tablets Lanoril Salicylate  Anaprox Cuprimine Capsules Levenox Salocol  Anexsia-D Dalteparin Magan Salsalate  Anodynos Darvon compound Magnesium Salicylate Sine-off  Ansaid Dasin Capsules Magsal Sodium Salicylate  Anturane Depen Capsules Marnal Soma  APF Arthritis pain formula Dewitt's Pills Measurin Stanback  Argesic Dia-Gesic Meclofenamic  Sulfinpyrazone  Arthritis Bayer Timed Release Aspirin Diclofenac Meclomen Sulindac  Arthritis pain formula Anacin Dicumarol Medipren Supac  Analgesic (Safety coated) Arthralgen Diffunasal Mefanamic Suprofen  Arthritis Strength Bufferin Dihydrocodeine Mepro Compound Suprol  Arthropan liquid Dopirydamole Methcarbomol with Aspirin Synalgos  ASA tablets/Enseals Disalcid Micrainin Tagament  Ascriptin Doan's Midol Talwin  Ascriptin A/D Dolene Mobidin Tanderil  Ascriptin Extra Strength Dolobid Moblgesic Ticlid  Ascriptin with Codeine Doloprin or Doloprin with Codeine Momentum Tolectin  Asperbuf Duoprin Mono-gesic Trendar  Aspergum Duradyne Motrin or Motrin IB Triminicin  Aspirin plain, buffered or enteric coated Durasal Myochrisine Trigesic  Aspirin Suppositories Easprin Nalfon Trillsate  Aspirin with Codeine Ecotrin Regular or Extra Strength Naprosyn Uracel  Atromid-S Efficin Naproxen Ursinus  Auranofin Capsules Elmiron Neocylate Vanquish  Axotal Emagrin Norgesic Verin  Azathioprine Empirin or Empirin with Codeine Normiflo Vitamin E  Azolid Emprazil Nuprin Voltaren  Bayer Aspirin plain, buffered or children's or timed BC Tablets or powders Encaprin Orgaran Warfarin Sodium  Buff-a-Comp Enoxaparin Orudis Zorpin  Buff-a-Comp with Codeine Equegesic Os-Cal-Gesic   Buffaprin Excedrin plain, buffered or Extra Strength Oxalid   Bufferin Arthritis Strength Feldene Oxphenbutazone   Bufferin plain or Extra Strength Feldene Capsules Oxycodone with Aspirin   Bufferin with Codeine Fenoprofen Fenoprofen Pabalate or Pabalate-SF   Buffets II Flogesic Panagesic   Buffinol plain or Extra Strength Florinal or Florinal with Codeine Panwarfarin   Buf-Tabs Flurbiprofen Penicillamine   Butalbital Compound Four-way cold tablets Penicillin   Butazolidin Fragmin Pepto-Bismol   Carbenicillin Geminisyn Percodan   Carna Arthritis Reliever Geopen Persantine   Carprofen Gold's salt Persistin   Chloramphenicol Goody's  Phenylbutazone   Chloromycetin Haltrain Piroxlcam   Clmetidine heparin Plaquenil   Cllnoril Hyco-pap Ponstel   Clofibrate Hydroxy chloroquine Propoxyphen         Before stopping any of these medications, be sure to consult the physician who ordered them.  Some, such as Coumadin (Warfarin) are ordered to prevent or treat serious conditions such as "deep thrombosis", "pumonary embolisms", and other heart problems.  The amount of time that you may need off of the medication may also vary with the medication and the reason for which you were taking it.  If you are taking any of these medications, please make sure you notify your pain physician before you undergo any procedures.         Sacroiliac (SI) Joint Injection Patient Information  Description: The sacroiliac joint connects the scrum (  very low back and tailbone) to the ilium (a pelvic bone which also forms half of the hip joint).  Normally this joint experiences very little motion.  When this joint becomes inflamed or unstable low back and or hip and pelvis pain may result.  Injection of this joint with local anesthetics (numbing medicines) and steroids can provide diagnostic information and reduce pain.  This injection is performed with the aid of x-ray guidance into the tailbone area while you are lying on your stomach.   You may experience an electrical sensation down the leg while this is being done.  You may also experience numbness.  We also may ask if we are reproducing your normal pain during the injection.  Conditions which may be treated SI injection:   Low back, buttock, hip or leg pain  Preparation for the Injection:  1. Do not eat any solid food or dairy products within 6 hours of your appointment.  2. You may drink clear liquids up to 2 hours before appointment.  Clear liquids include water, black coffee, juice or soda.  No milk or cream please. 3. You may take your regular medications, including pain medications with a  sip of water before your appointment.  Diabetics should hold regular insulin (if take separately) and take 1/2 normal NPH dose the morning of the procedure.  Carry some sugar containing items with you to your appointment. 4. A driver must accompany you and be prepared to drive you home after your procedure. 5. Bring all of your current medications with you. 6. An IV may be inserted and sedation may be given at the discretion of the physician. 7. A blood pressure cuff, EKG and other monitors will often be applied during the procedure.  Some patients may need to have extra oxygen administered for a short period.  8. You will be asked to provide medical information, including your allergies, prior to the procedure.  We must know immediately if you are taking blood thinners (like Coumadin/Warfarin) or if you are allergic to IV iodine contrast (dye).  We must know if you could possible be pregnant.  Possible side effects:   Bleeding from needle site  Infection (rare, may require surgery)  Nerve injury (rare)  Numbness & tingling (temporary)  A brief convulsion or seizure  Light-headedness (temporary)  Pain at injection site (several days)  Decreased blood pressure (temporary)  Weakness in the leg (temporary)   Call if you experience:   New onset weakness or numbness of an extremity below the injection site that last more than 8 hours.  Hives or difficulty breathing ( go to the emergency room)  Inflammation or drainage at the injection site  Any new symptoms which are concerning to you  Please note:  Although the local anesthetic injected can often make your back/ hip/ buttock/ leg feel good for several hours after the injections, the pain will likely return.  It takes 3-7 days for steroids to work in the sacroiliac area.  You may not notice any pain relief for at least that one week.  If effective, we will often do a series of three injections spaced 3-6 weeks apart to  maximally decrease your pain.  After the initial series, we generally will wait some months before a repeat injection of the same type.  If you have any questions, please call [phone] [facility]

## 2015-11-01 DIAGNOSIS — F39 Unspecified mood [affective] disorder: Secondary | ICD-10-CM | POA: Diagnosis not present

## 2015-11-01 DIAGNOSIS — F4310 Post-traumatic stress disorder, unspecified: Secondary | ICD-10-CM | POA: Diagnosis not present

## 2015-11-07 ENCOUNTER — Encounter: Payer: Self-pay | Admitting: Pain Medicine

## 2015-11-09 ENCOUNTER — Ambulatory Visit: Payer: Medicare Other | Admitting: Pain Medicine

## 2015-11-14 ENCOUNTER — Telehealth: Payer: Self-pay | Admitting: Pain Medicine

## 2015-11-14 NOTE — Telephone Encounter (Signed)
Nurses Please see if patient is due for a refill of her Flexeril Please verify daily dose of Flexeril patient is taking And discussed with me after you've had spoken with patient

## 2015-11-14 NOTE — Telephone Encounter (Signed)
Jill Robertson and Nurses Please discuss details of request for refill of medication with me so that we can consider refill

## 2015-11-14 NOTE — Telephone Encounter (Signed)
patient states taking 2-3 flexeril 10mg  per day as needed. Please call in refill to pharmacy on file. Thank you.Anderson Malta

## 2015-11-15 ENCOUNTER — Encounter: Payer: Self-pay | Admitting: Pain Medicine

## 2015-11-15 ENCOUNTER — Other Ambulatory Visit: Payer: Self-pay | Admitting: Pain Medicine

## 2015-11-15 MED ORDER — CYCLOBENZAPRINE HCL 10 MG PO TABS: ORAL_TABLET | ORAL | Status: DC

## 2015-11-16 ENCOUNTER — Ambulatory Visit: Payer: Medicare Other | Attending: Pain Medicine | Admitting: Pain Medicine

## 2015-11-16 ENCOUNTER — Encounter: Payer: Self-pay | Admitting: Pain Medicine

## 2015-11-16 VITALS — BP 123/77 | HR 79 | Temp 98.2°F | Resp 16 | Ht 68.0 in | Wt 210.0 lb

## 2015-11-16 DIAGNOSIS — M545 Low back pain: Secondary | ICD-10-CM | POA: Insufficient documentation

## 2015-11-16 DIAGNOSIS — M5416 Radiculopathy, lumbar region: Secondary | ICD-10-CM | POA: Diagnosis not present

## 2015-11-16 DIAGNOSIS — Z981 Arthrodesis status: Secondary | ICD-10-CM

## 2015-11-16 DIAGNOSIS — M79606 Pain in leg, unspecified: Secondary | ICD-10-CM | POA: Insufficient documentation

## 2015-11-16 DIAGNOSIS — M533 Sacrococcygeal disorders, not elsewhere classified: Secondary | ICD-10-CM

## 2015-11-16 DIAGNOSIS — M5030 Other cervical disc degeneration, unspecified cervical region: Secondary | ICD-10-CM

## 2015-11-16 DIAGNOSIS — M47816 Spondylosis without myelopathy or radiculopathy, lumbar region: Secondary | ICD-10-CM

## 2015-11-16 DIAGNOSIS — M5136 Other intervertebral disc degeneration, lumbar region: Secondary | ICD-10-CM | POA: Diagnosis not present

## 2015-11-16 MED ORDER — BUPIVACAINE HCL (PF) 0.25 % IJ SOLN
30.0000 mL | Freq: Once | INTRAMUSCULAR | Status: AC
  Administered 2015-11-16: 30 mL

## 2015-11-16 MED ORDER — OXYCODONE HCL 10 MG PO TABS: ORAL_TABLET | ORAL | Status: DC

## 2015-11-16 MED ORDER — ORPHENADRINE CITRATE 30 MG/ML IJ SOLN
60.0000 mg | Freq: Once | INTRAMUSCULAR | Status: AC
  Administered 2015-11-16: 60 mg via INTRAMUSCULAR

## 2015-11-16 MED ORDER — CEFAZOLIN SODIUM 1 G IJ SOLR
INTRAMUSCULAR | Status: AC
  Administered 2015-11-16: 1 g via INTRAVENOUS
  Filled 2015-11-16: qty 10

## 2015-11-16 MED ORDER — CEFUROXIME AXETIL 250 MG PO TABS: 250.0000 mg | ORAL_TABLET | Freq: Two times a day (BID) | ORAL | Status: DC

## 2015-11-16 MED ORDER — FENTANYL CITRATE (PF) 100 MCG/2ML IJ SOLN
100.0000 ug | Freq: Once | INTRAMUSCULAR | Status: AC
  Administered 2015-11-16: 100 ug via INTRAVENOUS

## 2015-11-16 MED ORDER — MIDAZOLAM HCL 5 MG/5ML IJ SOLN
5.0000 mg | Freq: Once | INTRAMUSCULAR | Status: AC
  Administered 2015-11-16: 3 mg via INTRAVENOUS

## 2015-11-16 MED ORDER — FENTANYL CITRATE (PF) 100 MCG/2ML IJ SOLN
INTRAMUSCULAR | Status: AC
  Administered 2015-11-16: 100 ug via INTRAVENOUS
  Filled 2015-11-16: qty 2

## 2015-11-16 MED ORDER — TRIAMCINOLONE ACETONIDE 40 MG/ML IJ SUSP
INTRAMUSCULAR | Status: AC
  Administered 2015-11-16: 40 mg
  Filled 2015-11-16: qty 1

## 2015-11-16 MED ORDER — ORPHENADRINE CITRATE 30 MG/ML IJ SOLN
INTRAMUSCULAR | Status: AC
  Administered 2015-11-16: 60 mg via INTRAMUSCULAR
  Filled 2015-11-16: qty 2

## 2015-11-16 MED ORDER — MIDAZOLAM HCL 5 MG/5ML IJ SOLN
INTRAMUSCULAR | Status: AC
  Administered 2015-11-16: 3 mg via INTRAVENOUS
  Filled 2015-11-16: qty 5

## 2015-11-16 MED ORDER — LACTATED RINGERS IV SOLN
1000.0000 mL | INTRAVENOUS | Status: DC
Start: 2015-11-16 — End: 2023-01-21

## 2015-11-16 MED ORDER — BUPIVACAINE HCL (PF) 0.25 % IJ SOLN
INTRAMUSCULAR | Status: AC
  Administered 2015-11-16: 30 mL
  Filled 2015-11-16: qty 30

## 2015-11-16 MED ORDER — CEFAZOLIN SODIUM 1-5 GM-% IV SOLN: 1.0000 g | Freq: Once | INTRAVENOUS | Status: DC

## 2015-11-16 MED ORDER — TRIAMCINOLONE ACETONIDE 40 MG/ML IJ SUSP
40.0000 mg | Freq: Once | INTRAMUSCULAR | Status: AC
  Administered 2015-11-16: 40 mg

## 2015-11-16 NOTE — Patient Instructions (Addendum)
PLAN   Continue present medications for now as discussed . Please obtain Ceftin antibiotic and begin taking Ceftin antibiotic today as prescribed  F/U PCP  S Amin  for evaliation of  BP and general medical  condition  F/U surgical evaluation. Follow-up with Dr. Sherley Bounds as discussed  F/U neurological evaluation. May consider pending follow-up evaluations  May consider radiofrequency rhizolysis or intraspinal procedures pending response to present treatment and F/U evaluation Pain Management Discharge Instructions  General Discharge Instructions :  If you need to reach your doctor call: Monday-Friday 8:00 am - 4:00 pm at [phone] or toll free [phone].  After clinic hours [phone] to have operator reach doctor.  Bring all of your medication bottles to all your appointments in the pain clinic.  To cancel or reschedule your appointment with Pain Management please remember to call 24 hours in advance to avoid a fee.  Refer to the educational materials which you have been given on: General Risks, I had my Procedure. Discharge Instructions, Post Sedation.  Post Procedure Instructions:  The drugs you were given will stay in your system until tomorrow, so for the next 24 hours you should not drive, make any legal decisions or drink any alcoholic beverages.  You may eat anything you prefer, but it is better to start with liquids then soups and crackers, and gradually work up to solid foods.  Please notify your doctor immediately if you have any unusual bleeding, trouble breathing or pain that is not related to your normal pain.  Depending on the type of procedure that was done, some parts of your body may feel week and/or numb.  This usually clears up by tonight or the next day.  Walk with the use of an assistive device or accompanied by an adult for the 24 hours.  You may use ice on the affected area for the first 24 hours.  Put ice in a Ziploc bag and cover with a towel  and place against area 15 minutes on 15 minutes off.  You may switch to heat after 24 hours.GENERAL RISKS AND COMPLICATIONS  What are the risk, side effects and possible complications? Generally speaking, most procedures are safe.  However, with any procedure there are risks, side effects, and the possibility of complications.  The risks and complications are dependent upon the sites that are lesioned, or the type of nerve block to be performed.  The closer the procedure is to the spine, the more serious the risks are.  Great care is taken when placing the radio frequency needles, block needles or lesioning probes, but sometimes complications can occur. 1. Infection: Any time there is an injection through the skin, there is a risk of infection.  This is why sterile conditions are used for these blocks.  There are four possible types of infection. 1. Localized skin infection. 2. Central Nervous System Infection-This can be in the form of Meningitis, which can be deadly. 3. Epidural Infections-This can be in the form of an epidural abscess, which can cause pressure inside of the spine, causing compression of the spinal cord with subsequent paralysis. This would require an emergency surgery to decompress, and there are no guarantees that the patient would recover from the paralysis. 4. Discitis-This is an infection of the intervertebral discs.  It occurs in about 1% of discography procedures.  It is difficult to treat and it may lead to surgery.        2. Pain: the needles have to go through skin  and soft tissues, will cause soreness.       3. Damage to internal structures:  The nerves to be lesioned may be near blood vessels or    other nerves which can be potentially damaged.       4. Bleeding: Bleeding is more common if the patient is taking blood thinners such as  aspirin, Coumadin, Ticiid, Plavix, etc., or if he/she have some genetic predisposition  such as hemophilia. Bleeding into the spinal canal  can cause compression of the spinal  cord with subsequent paralysis.  This would require an emergency surgery to  decompress and there are no guarantees that the patient would recover from the  paralysis.       5. Pneumothorax:  Puncturing of a lung is a possibility, every time a needle is introduced in  the area of the chest or upper back.  Pneumothorax refers to free air around the  collapsed lung(s), inside of the thoracic cavity (chest cavity).  Another two possible  complications related to a similar event would include: Hemothorax and Chylothorax.   These are variations of the Pneumothorax, where instead of air around the collapsed  lung(s), you may have blood or chyle, respectively.       6. Spinal headaches: They may occur with any procedures in the area of the spine.       7. Persistent CSF (Cerebro-Spinal Fluid) leakage: This is a rare problem, but may occur  with prolonged intrathecal or epidural catheters either due to the formation of a fistulous  track or a dural tear.       8. Nerve damage: By working so close to the spinal cord, there is always a possibility of  nerve damage, which could be as serious as a permanent spinal cord injury with  paralysis.       9. Death:  Although rare, severe deadly allergic reactions known as "Anaphylactic  reaction" can occur to any of the medications used.      10. Worsening of the symptoms:  We can always make thing worse.  What are the chances of something like this happening? Chances of any of this occuring are extremely low.  By statistics, you have more of a chance of getting killed in a motor vehicle accident: while driving to the hospital than any of the above occurring .  Nevertheless, you should be aware that they are possibilities.  In general, it is similar to taking a shower.  Everybody knows that you can slip, hit your head and get killed.  Does that mean that you should not shower again?  Nevertheless always keep in mind that statistics do not  mean anything if you happen to be on the wrong side of them.  Even if a procedure has a 1 (one) in a 1,000,000 (million) chance of going wrong, it you happen to be that one..Also, keep in mind that by statistics, you have more of a chance of having something go wrong when taking medications.  Who should not have this procedure? If you are on a blood thinning medication (e.g. Coumadin, Plavix, see list of "Blood Thinners"), or if you have an active infection going on, you should not have the procedure.  If you are taking any blood thinners, please inform your physician.  How should I prepare for this procedure?  Do not eat or drink anything at least six hours prior to the procedure.  Bring a driver with you .  It cannot be a taxi.  Come accompanied by an adult that can drive you back, and that is strong enough to help you if your legs get weak or numb from the local anesthetic.  Take all of your medicines the morning of the procedure with just enough water to swallow them.  If you have diabetes, make sure that you are scheduled to have your procedure done first thing in the morning, whenever possible.  If you have diabetes, take only half of your insulin dose and notify our nurse that you have done so as soon as you arrive at the clinic.  If you are diabetic, but only take blood sugar pills (oral hypoglycemic), then do not take them on the morning of your procedure.  You may take them after you have had the procedure.  Do not take aspirin or any aspirin-containing medications, at least eleven (11) days prior to the procedure.  They may prolong bleeding.  Wear loose fitting clothing that may be easy to take off and that you would not mind if it got stained with Betadine or blood.  Do not wear any jewelry or perfume  Remove any nail coloring.  It will interfere with some of our monitoring equipment.  NOTE: Remember that this is not meant to be interpreted as a complete list of all possible  complications.  Unforeseen problems may occur.  BLOOD THINNERS The following drugs contain aspirin or other products, which can cause increased bleeding during surgery and should not be taken for 2 weeks prior to and 1 week after surgery.  If you should need take something for relief of minor pain, you may take acetaminophen which is found in Tylenol,m Datril, Anacin-3 and Panadol. It is not blood thinner. The products listed below are.  Do not take any of the products listed below in addition to any listed on your instruction sheet.  A.P.C or A.P.C with Codeine Codeine Phosphate Capsules #3 Ibuprofen Ridaura  ABC compound Congesprin Imuran rimadil  Advil Cope Indocin Robaxisal  Alka-Seltzer Effervescent Pain Reliever and Antacid Coricidin or Coricidin-D  Indomethacin Rufen  Alka-Seltzer plus Cold Medicine Cosprin Ketoprofen S-A-C Tablets  Anacin Analgesic Tablets or Capsules Coumadin Korlgesic Salflex  Anacin Extra Strength Analgesic tablets or capsules CP-2 Tablets Lanoril Salicylate  Anaprox Cuprimine Capsules Levenox Salocol  Anexsia-D Dalteparin Magan Salsalate  Anodynos Darvon compound Magnesium Salicylate Sine-off  Ansaid Dasin Capsules Magsal Sodium Salicylate  Anturane Depen Capsules Marnal Soma  APF Arthritis pain formula Dewitt's Pills Measurin Stanback  Argesic Dia-Gesic Meclofenamic Sulfinpyrazone  Arthritis Bayer Timed Release Aspirin Diclofenac Meclomen Sulindac  Arthritis pain formula Anacin Dicumarol Medipren Supac  Analgesic (Safety coated) Arthralgen Diffunasal Mefanamic Suprofen  Arthritis Strength Bufferin Dihydrocodeine Mepro Compound Suprol  Arthropan liquid Dopirydamole Methcarbomol with Aspirin Synalgos  ASA tablets/Enseals Disalcid Micrainin Tagament  Ascriptin Doan's Midol Talwin  Ascriptin A/D Dolene Mobidin Tanderil  Ascriptin Extra Strength Dolobid Moblgesic Ticlid  Ascriptin with Codeine Doloprin or Doloprin with Codeine Momentum Tolectin  Asperbuf Duoprin  Mono-gesic Trendar  Aspergum Duradyne Motrin or Motrin IB Triminicin  Aspirin plain, buffered or enteric coated Durasal Myochrisine Trigesic  Aspirin Suppositories Easprin Nalfon Trillsate  Aspirin with Codeine Ecotrin Regular or Extra Strength Naprosyn Uracel  Atromid-S Efficin Naproxen Ursinus  Auranofin Capsules Elmiron Neocylate Vanquish  Axotal Emagrin Norgesic Verin  Azathioprine Empirin or Empirin with Codeine Normiflo Vitamin E  Azolid Emprazil Nuprin Voltaren  Bayer Aspirin plain, buffered or children's or timed BC Tablets or powders Encaprin Orgaran Warfarin Sodium  Buff-a-Comp Enoxaparin Orudis Zorpin  Buff-a-Comp with Codeine Equegesic Os-Cal-Gesic   Buffaprin Excedrin plain, buffered or Extra Strength Oxalid   Bufferin Arthritis Strength Feldene Oxphenbutazone   Bufferin plain or Extra Strength Feldene Capsules Oxycodone with Aspirin   Bufferin with Codeine Fenoprofen Fenoprofen Pabalate or Pabalate-SF   Buffets II Flogesic Panagesic   Buffinol plain or Extra Strength Florinal or Florinal with Codeine Panwarfarin   Buf-Tabs Flurbiprofen Penicillamine   Butalbital Compound Four-way cold tablets Penicillin   Butazolidin Fragmin Pepto-Bismol   Carbenicillin Geminisyn Percodan   Carna Arthritis Reliever Geopen Persantine   Carprofen Gold's salt Persistin   Chloramphenicol Goody's Phenylbutazone   Chloromycetin Haltrain Piroxlcam   Clmetidine heparin Plaquenil   Cllnoril Hyco-pap Ponstel   Clofibrate Hydroxy chloroquine Propoxyphen         Before stopping any of these medications, be sure to consult the physician who ordered them.  Some, such as Coumadin (Warfarin) are ordered to prevent or treat serious conditions such as "deep thrombosis", "pumonary embolisms", and other heart problems.  The amount of time that you may need off of the medication may also vary with the medication and the reason for which you were taking it.  If you are taking any of these medications, please  make sure you notify your pain physician before you undergo any procedures.  Oxycodone prescription given

## 2015-11-16 NOTE — Progress Notes (Signed)
Subjective:    Patient ID: Jill Robertson, female    DOB: Jan 21, 1961, 55 y.o.   MRN: [id number]  HPI  PROCEDURE:  Block of nerves to the sacroiliac joint.   NOTE:  The patient is a 55 y.o. female who returns to the [facility] for further evaluation and treatment of pain involving the lower back and lower extremity region with pain in the region of the buttocks as well. Prior MRI studies reveal Degenerative disc disease lumbar spine L3 for disc desiccation. L4-L5 disc bulging. L5-S1 disc space narrowing with endplate spurring and cervical region disc bulging with progression of findings since prior study of 2015. . The patient is with reproduction of severe pain with palpation of the PSIS and PII S regions especially on the left. The patient also is with positive Patrick's maneuver on the left.  There is concern regarding a significant component of the patient's pain being due to sacroiliac joint dysfunction The risks, benefits, expectations of the procedure have been discussed and explained to the patient who is understanding and willing to proceed with interventional treatment in attempt to decrease severity of patient's symptoms, minimize the risk of medication escalation and  hopefully retard the progression of the patient's symptoms. We will proceed with what is felt to be a medically necessary procedure, block of nerves to the sacroiliac joint.   DESCRIPTION OF PROCEDURE:  Block of nerves to the sacroiliac joint.   The patient was taken to the fluoroscopy suite. With the patient in the prone position with EKG, blood pressure, pulse and pulse oximetry monitoring, IV Versed, IV fentanyl conscious sedation, Betadine prep of proposed entry site was performed.   Block of nerves at the L5 vertebral body level.   With the patient in prone position, under fluoroscopic guidance, a 22 -gauge needle was inserted at the L5 vertebral body level on the left side. With 15 degrees oblique  orientation a 22 -gauge needle was inserted in the region known as Burton's eye or eye of the Scotty dog. Following documentation of needle placement in the area of Burton's eye or eye of the Scotty dog under fluoroscopic guidance, needle placement was then accomplished at the sacral ala level on the left side.   Needle placement at the sacral ala   With the patient in prone position under fluoroscopic guidance with AP view of the lumbosacral spine, a 22 -gauge needle was inserted in the region known as the sacral ala on the left side. Following documentation of needle placement on the left side under fluoroscopic guidance needle placement was then accomplished at the S1 foramen level.   Needle placement at the S1 foramen level.   With the patient in prone position under fluoroscopic guidance with AP view of the lumbosacral spine and cephalad orientation, a 22 -gauge needle was inserted at the superior and lateral border of the S1 foramen on the left side. Following documentation of needle placement at the S1 foramen level on the left side, needle placement was then accomplished at the S2 foramen level on the left side.   Needle placement at the S2 foramen level.   With the patient in prone position with AP view of the lumbosacral spine with cephalad orientation, a 22 - gauge needle was inserted at the superior and lateral border of the S2 foramen under fluoroscopic guidance on the left side. Following needle placement at the L5 vertebral body level, sacral ala, S1 foramen and S2 foramen on the left side, needle placement  was verified on lateral view under fluoroscopic guidance.  Following needle placement documentation on lateral view, each needle was injected with 1 mL of 0.25% bupivacaine and Kenalog.   A total of 10mg  of Kenalog was utilized for the procedure.   PLAN:  1. Medications: The patient will continue presently prescribed medications. Flexeril and oxycodone 2. The patient will be  considered for modification of treatment regimen pending response to the procedure performed on today's visit.  3. The patient is to follow-up with primary care physician Dr. Reesa Chew for evaluation of blood pressure and general medical condition following the procedure performed on today's visit.  4. Surgical evaluation as discussed. . Patient will undergo surgical follow-up evaluation as discussed  5. Neurological evaluation as discussed. May consider [facility] EMG studies and other studies  6. The patient may be a candidate for radiofrequency procedures, implantation devices and other treatment pending response to treatment performed on today's visit and follow-up evaluation.  7. The patient has been advised to adhere to proper body mechanics and to avoid activities which may exacerbate the patient's symptoms.   Return appointment to Pain Management Center as scheduled.    Review of Systems     Objective:   Physical Exam        Assessment & Plan:

## 2015-11-16 NOTE — Progress Notes (Signed)
Safety precautions to be maintained throughout the outpatient stay will include: orient to surroundings, keep bed in low position, maintain call bell within reach at all times, provide assistance with transfer out of bed and ambulation.  

## 2015-11-17 ENCOUNTER — Telehealth: Payer: Self-pay | Admitting: *Deleted

## 2015-11-17 NOTE — Telephone Encounter (Signed)
Spoke with patient, verbalizes no complications from procedure on yesterday. 

## 2015-11-18 ENCOUNTER — Telehealth: Payer: Self-pay | Admitting: *Deleted

## 2015-11-18 ENCOUNTER — Encounter: Payer: Self-pay | Admitting: Pain Medicine

## 2015-11-18 NOTE — Telephone Encounter (Signed)
Left voicemail with patient that if she needs her Rx changed she will need to schedule an appointment with Dr Primus Bravo.

## 2015-12-14 ENCOUNTER — Ambulatory Visit: Payer: Medicare Other | Admitting: Pain Medicine

## 2015-12-14 ENCOUNTER — Encounter: Payer: Self-pay | Admitting: Pain Medicine

## 2015-12-19 ENCOUNTER — Ambulatory Visit: Payer: Medicare Other | Attending: Pain Medicine | Admitting: Pain Medicine

## 2015-12-19 ENCOUNTER — Encounter: Payer: Self-pay | Admitting: Pain Medicine

## 2015-12-19 VITALS — BP 125/80 | HR 89 | Temp 98.1°F | Resp 16 | Ht 68.0 in | Wt 200.0 lb

## 2015-12-19 DIAGNOSIS — M5136 Other intervertebral disc degeneration, lumbar region: Secondary | ICD-10-CM | POA: Diagnosis not present

## 2015-12-19 DIAGNOSIS — M5030 Other cervical disc degeneration, unspecified cervical region: Secondary | ICD-10-CM | POA: Insufficient documentation

## 2015-12-19 DIAGNOSIS — M791 Myalgia: Secondary | ICD-10-CM | POA: Diagnosis not present

## 2015-12-19 DIAGNOSIS — M79606 Pain in leg, unspecified: Secondary | ICD-10-CM | POA: Diagnosis present

## 2015-12-19 DIAGNOSIS — M5416 Radiculopathy, lumbar region: Secondary | ICD-10-CM

## 2015-12-19 DIAGNOSIS — M47817 Spondylosis without myelopathy or radiculopathy, lumbosacral region: Secondary | ICD-10-CM | POA: Diagnosis not present

## 2015-12-19 DIAGNOSIS — M5126 Other intervertebral disc displacement, lumbar region: Secondary | ICD-10-CM | POA: Insufficient documentation

## 2015-12-19 DIAGNOSIS — M542 Cervicalgia: Secondary | ICD-10-CM | POA: Diagnosis present

## 2015-12-19 DIAGNOSIS — Z981 Arthrodesis status: Secondary | ICD-10-CM | POA: Diagnosis not present

## 2015-12-19 DIAGNOSIS — M545 Low back pain: Secondary | ICD-10-CM | POA: Diagnosis present

## 2015-12-19 DIAGNOSIS — M47816 Spondylosis without myelopathy or radiculopathy, lumbar region: Secondary | ICD-10-CM

## 2015-12-19 DIAGNOSIS — M533 Sacrococcygeal disorders, not elsewhere classified: Secondary | ICD-10-CM | POA: Diagnosis not present

## 2015-12-19 MED ORDER — OXYCODONE HCL 10 MG PO TABS: ORAL_TABLET | ORAL | Status: DC

## 2015-12-19 NOTE — Progress Notes (Signed)
Safety precautions to be maintained throughout the outpatient stay will include: orient to surroundings, keep bed in low position, maintain call bell within reach at all times, provide assistance with transfer out of bed and ambulation.  

## 2015-12-19 NOTE — Progress Notes (Signed)
   Subjective:    Patient ID: Jill Robertson, female    DOB: [birth year], 55 y.o.   MRN: [id number]  HPI  Is a 55 year old female who returns to pain management for further evaluation and treatment of lower back lower extremity pain and pain of the neck as well the patient has significant lower back lower extremity pain radiating to the left lower extremity and right lower extremity. The patient recently travel to [place] and the long ride has exacerbated her pain significantly we discussed patient's condition and will proceed with lumbosacral selective nerve root block at time of return appointment and will continue medications as presently prescribed. The patient is in agreement with suggested treatment plan.      Review of Systems     Objective:   Physical Exam  There was tenderness over the splenius capitis and occipitalis musculature region with well-healed surgical scar of the cervical region without increased warmth and erythema in the region of the scar. The patient was with decreased range of motion of the cervical spine with unremarkable Spurling's maneuver. There was questionably decreased grip strength and Tinel and Phalen's maneuver were without increased pain of significant degree. Palpation over the thoracic facet thoracic paraspinal musculature region was without crepitus of the thoracic region and was with significant muscle spasm of the lower thoracic paraspinal musculature region. Palpation over the lumbar paraspinal must reason lumbar facet region was with moderately severe discomfort with lateral bending rotation extension and palpation of the lumbar facets reproducing moderately severe discomfort on the left as well as on the right. There was tenderness of the PSIS and PII S region a moderate degree. Gait leg raising was tolerates approximately 20 without a definite increased pain with dorsiflexion noted. There was negative clonus negative Homans. There was question  decreased sensation along the L5 dermatomal distribution. EHL strength appeared to be decreased. There was negative clonus negative Homans. Abdomen was nontender with no costovertebral tenderness noted      Assessment & Plan:     Degenerative disc disease lumbar spine L3 for disc desiccation. L4-L5 disc bulging. L5-S1 disc space narrowing with endplate spurring and cervical region disc bulging with progression of findings since prior study of 2015  Sacroiliac joint dysfunction  Degenerative disc disease lumbar spine  Lumbar facet syndrome  Degenerative disc disease cervical spine Status post C4-C7 ACDF   PLAN   Continue present medication Lyrica and oxycodone for now as discussed  NO NAPROXEN  NO IBUPROFEN  Lumbosacral selective nerve root block to be performed at time of return appointment  F/U PCP  S Amin  for evaliation of  BP and general medical  condition  F/U surgical evaluation. Follow-up with Dr. Sherley Bounds as discussed  F/U psych evaluation as discussed  F/U neurological evaluation. May consider pending follow-up evaluations  May consider radiofrequency rhizolysis or intraspinal procedures pending response to present treatment and F/U evaluation   Patient to call Pain Management Center should patient have concerns prior to scheduled return appointment.

## 2015-12-19 NOTE — Patient Instructions (Addendum)
PLAN   Continue present medication Lyrica and oxycodone for now as discussed  NO NAPROXEN  NO IBUPROFEN  Lumbosacral selective nerve root block to be performed at time of return appointment  F/U PCP  S Amin  for evaliation of  BP and general medical  condition  F/U surgical evaluation. Follow-up with Dr. Sherley Bounds as discussed  F/U psych evaluation as discussed  F/U neurological evaluation. May consider pending follow-up evaluations  May consider radiofrequency rhizolysis or intraspinal procedures pending response to present treatment and F/U evaluation   Patient to call [facility] should patient have concerns prior to scheduled return appointment.Selective Nerve Root Block Patient Information  Description: Specific nerve roots exit the spinal canal and these nerves can be compressed and inflamed by a bulging disc and bone spurs.  By injecting steroids on the nerve root, we can potentially decrease the inflammation surrounding these nerves, which often leads to decreased pain.  Also, by injecting local anesthesia on the nerve root, this can provide [place] helpful information to give to your referring doctor if it decreases your pain.  Selective nerve root blocks can be done along the spine from the neck to the low back depending on the location of your pain.   After numbing the skin with local anesthesia, a small needle is passed to the nerve root and the position of the needle is verified using x-ray pictures.  After the needle is in correct position, we then deposit the medication.  You may experience a pressure sensation while this is being done.  The entire block usually lasts less than 15 minutes.  Conditions that may be treated with selective nerve root blocks:  Low back and leg pain  Spinal stenosis  Diagnostic block prior to potential surgery  Neck and arm pain  Post laminectomy syndrome  Preparation for the injection:  1. Do not eat any solid food or dairy  products within 6 hours of your appointment. 2. You may drink clear liquids up to 2 hours before an appointment.  Clear liquids include water, black coffee, juice or soda.  No milk or cream please. 3. You may take your regular medications, including pain medications, with a sip of water before your appointment.  Diabetics should hold regular insulin (if taken separately) and take 1/2 normal NPH dose the morning of the procedure.  Carry some sugar containing items with you to your appointment. 4. A driver must accompany you and be prepared to drive you home after your procedure. 5. Bring all your current medications with you. 6. An IV may be inserted and sedation may be given at the discretion of the physician. 7. A blood pressure cuff, EKG, and other monitors will often be applied during the procedure.  Some patients may need to have extra oxygen administered for a short period. 8. You will be asked to provide medical information, including allergies, prior to the procedure.  We must know immediately if you are taking blood  Thinners (like Coumadin) or if you are allergic to IV iodine contrast (dye).  Possible side-effects: All are usually temporary  Bleeding from needle site  Light headedness  Numbness and tingling  Decreased blood pressure  Weakness in arms/legs  Pressure sensation in back/neck  Pain at injection site (several days)  Possible complications: All are extremely rare  Infection  Nerve injury  Spinal headache (a headache wore with upright position)  Call if you experience:  Fever/chills associated with headache or increased back/neck pain  Headache  worsened by an upright position  New onset weakness or numbness of an extremity below the injection site  Hives or difficulty breathing (go to the emergency room)  Inflammation or drainage at the injection site(s)  Severe back/neck pain greater than usual  New symptoms which are concerning to you  Please  note:  Although the local anesthetic injected can often make your back or neck feel good for several hours after the injection the pain will likely return.  It takes 3-5 days for steroids to work on the nerve root. You may not notice any pain relief for at least one week.  If effective, we will often do a series of 3 injections spaced 3-6 weeks apart to maximally decrease your pain.    If you have any questions, please call [phone] [facility] Clinic[facility] R[facility] AND COMPLICATIONS  What are the risk, side effects and possible complications? Generally speaking, most procedures are safe.  However, with any procedure there are risks, side effects, and the possibility of complications.  The risks and complications are dependent upon the sites that are lesioned, or the type of nerve block to be performed.  The closer the procedure is to the spine, the more serious the risks are.  Great care is taken when placing the radio frequency needles, block needles or lesioning probes, but sometimes complications can occur. 1. Infection: Any time there is an injection through the skin, there is a risk of infection.  This is why sterile conditions are used for these blocks.  There are four possible types of infection. 1. Localized skin infection. 2. Central Nervous System Infection-This can be in the form of Meningitis, which can be deadly. 3. Epidural Infections-This can be in the form of an epidural abscess, which can cause pressure inside of the spine, causing compression of the spinal cord with subsequent paralysis. This would require an emergency surgery to decompress, and there are no guarantees that the patient would recover from the paralysis. 4. Discitis-This is an infection of the intervertebral discs.  It occurs in about 1% of discography procedures.  It is difficult to treat and it may lead to surgery.        2. Pain: the needles have to go through skin and soft tissues,  will cause soreness.       3. Damage to internal structures:  The nerves to be lesioned may be near blood vessels or    other nerves which can be potentially damaged.       4. Bleeding: Bleeding is more common if the patient is taking blood thinners such as  aspirin, Coumadin, Ticiid, Plavix, etc., or if he/she have some genetic predisposition  such as hemophilia. Bleeding into the spinal canal can cause compression of the spinal  cord with subsequent paralysis.  This would require an emergency surgery to  decompress and there are no guarantees that the patient would recover from the  paralysis.       5. Pneumothorax:  Puncturing of a lung is a possibility, every time a needle is introduced in  the area of the chest or upper back.  Pneumothorax refers to free air around the  collapsed lung(s), inside of the thoracic cavity (chest cavity).  Another two possible  complications related to a similar event would include: Hemothorax and Chylothorax.   These are variations of the Pneumothorax, where instead of air around the collapsed  lung(s), you may have blood or chyle, respectively.  6. Spinal headaches: They may occur with any procedures in the area of the spine.       7. Persistent CSF (Cerebro-Spinal Fluid) leakage: This is a rare problem, but may occur  with prolonged intrathecal or epidural catheters either due to the formation of a fistulous  track or a dural tear.       8. Nerve damage: By working so close to the spinal cord, there is always a possibility of  nerve damage, which could be as serious as a permanent spinal cord injury with  paralysis.       9. Death:  Although rare, severe deadly allergic reactions known as "Anaphylactic  reaction" can occur to any of the medications used.      10. Worsening of the symptoms:  We can always make thing worse.  What are the chances of something like this happening? Chances of any of this occuring are extremely low.  By statistics, you have more of a  chance of getting killed in a motor vehicle accident: while driving to the hospital than any of the above occurring .  Nevertheless, you should be aware that they are possibilities.  In general, it is similar to taking a shower.  Everybody knows that you can slip, hit your head and get killed.  Does that mean that you should not shower again?  Nevertheless always keep in mind that statistics do not mean anything if you happen to be on the wrong side of them.  Even if a procedure has a 1 (one) in a 1,000,000 (million) chance of going wrong, it you happen to be that one..Also, keep in mind that by statistics, you have more of a chance of having something go wrong when taking medications.  Who should not have this procedure? If you are on a blood thinning medication (e.g. Coumadin, Plavix, see list of "Blood Thinners"), or if you have an active infection going on, you should not have the procedure.  If you are taking any blood thinners, please inform your physician.  How should I prepare for this procedure?  Do not eat or drink anything at least six hours prior to the procedure.  Bring a driver with you .  It cannot be a taxi.  Come accompanied by an adult that can drive you back, and that is strong enough to help you if your legs get weak or numb from the local anesthetic.  Take all of your medicines the morning of the procedure with just enough water to swallow them.  If you have diabetes, make sure that you are scheduled to have your procedure done first thing in the morning, whenever possible.  If you have diabetes, take only half of your insulin dose and notify our nurse that you have done so as soon as you arrive at the clinic.  If you are diabetic, but only take blood sugar pills (oral hypoglycemic), then do not take them on the morning of your procedure.  You may take them after you have had the procedure.  Do not take aspirin or any aspirin-containing medications, at least eleven (11) days  prior to the procedure.  They may prolong bleeding.  Wear loose fitting clothing that may be easy to take off and that you would not mind if it got stained with Betadine or blood.  Do not wear any jewelry or perfume  Remove any nail coloring.  It will interfere with some of our monitoring equipment.  NOTE: Remember that this is  not meant to be interpreted as a complete list of all possible complications.  Unforeseen problems may occur.  BLOOD THINNERS The following drugs contain aspirin or other products, which can cause increased bleeding during surgery and should not be taken for 2 weeks prior to and 1 week after surgery.  If you should need take something for relief of minor pain, you may take acetaminophen which is found in Tylenol,m Datril, Anacin-3 and Panadol. It is not blood thinner. The products listed below are.  Do not take any of the products listed below in addition to any listed on your instruction sheet.  A.P.C or A.P.C with Codeine Codeine Phosphate Capsules #3 Ibuprofen Ridaura  ABC compound Congesprin Imuran rimadil  Advil Cope Indocin Robaxisal  Alka-Seltzer Effervescent Pain Reliever and Antacid Coricidin or Coricidin-D  Indomethacin Rufen  Alka-Seltzer plus Cold Medicine Cosprin Ketoprofen S-A-C Tablets  Anacin Analgesic Tablets or Capsules Coumadin Korlgesic Salflex  Anacin Extra Strength Analgesic tablets or capsules CP-2 Tablets Lanoril Salicylate  Anaprox Cuprimine Capsules Levenox Salocol  Anexsia-D Dalteparin Magan Salsalate  Anodynos Darvon compound Magnesium Salicylate Sine-off  Ansaid Dasin Capsules Magsal Sodium Salicylate  Anturane Depen Capsules Marnal Soma  APF Arthritis pain formula Dewitt's Pills Measurin Stanback  Argesic Dia-Gesic Meclofenamic Sulfinpyrazone  Arthritis Bayer Timed Release Aspirin Diclofenac Meclomen Sulindac  Arthritis pain formula Anacin Dicumarol Medipren Supac  Analgesic (Safety coated) Arthralgen Diffunasal Mefanamic Suprofen   Arthritis Strength Bufferin Dihydrocodeine Mepro Compound Suprol  Arthropan liquid Dopirydamole Methcarbomol with Aspirin Synalgos  ASA tablets/Enseals Disalcid Micrainin Tagament  Ascriptin Doan's Midol Talwin  Ascriptin A/D Dolene Mobidin Tanderil  Ascriptin Extra Strength Dolobid Moblgesic Ticlid  Ascriptin with Codeine Doloprin or Doloprin with Codeine Momentum Tolectin  Asperbuf Duoprin Mono-gesic Trendar  Aspergum Duradyne Motrin or Motrin IB Triminicin  Aspirin plain, buffered or enteric coated Durasal Myochrisine Trigesic  Aspirin Suppositories Easprin Nalfon Trillsate  Aspirin with Codeine Ecotrin Regular or Extra Strength Naprosyn Uracel  Atromid-S Efficin Naproxen Ursinus  Auranofin Capsules Elmiron Neocylate Vanquish  Axotal Emagrin Norgesic Verin  Azathioprine Empirin or Empirin with Codeine Normiflo Vitamin E  Azolid Emprazil Nuprin Voltaren  Bayer Aspirin plain, buffered or children's or timed BC Tablets or powders Encaprin Orgaran Warfarin Sodium  Buff-a-Comp Enoxaparin Orudis Zorpin  Buff-a-Comp with Codeine Equegesic Os-Cal-Gesic   Buffaprin Excedrin plain, buffered or Extra Strength Oxalid   Bufferin Arthritis Strength Feldene Oxphenbutazone   Bufferin plain or Extra Strength Feldene Capsules Oxycodone with Aspirin   Bufferin with Codeine Fenoprofen Fenoprofen Pabalate or Pabalate-SF   Buffets II Flogesic Panagesic   Buffinol plain or Extra Strength Florinal or Florinal with Codeine Panwarfarin   Buf-Tabs Flurbiprofen Penicillamine   Butalbital Compound Four-way cold tablets Penicillin   Butazolidin Fragmin Pepto-Bismol   Carbenicillin Geminisyn Percodan   Carna Arthritis Reliever Geopen Persantine   Carprofen Gold's salt Persistin   Chloramphenicol Goody's Phenylbutazone   Chloromycetin Haltrain Piroxlcam   Clmetidine heparin Plaquenil   Cllnoril Hyco-pap Ponstel   Clofibrate Hydroxy chloroquine Propoxyphen         Before stopping any of these medications,  be sure to consult the physician who ordered them.  Some, such as Coumadin (Warfarin) are ordered to prevent or treat serious conditions such as "deep thrombosis", "pumonary embolisms", and other heart problems.  The amount of time that you may need off of the medication may also vary with the medication and the reason for which you were taking it.  If you are taking any of these medications, please make sure  you notify your pain physician before you undergo any procedures.

## 2015-12-20 DIAGNOSIS — E785 Hyperlipidemia, unspecified: Secondary | ICD-10-CM | POA: Diagnosis not present

## 2015-12-20 DIAGNOSIS — M549 Dorsalgia, unspecified: Secondary | ICD-10-CM | POA: Diagnosis not present

## 2015-12-20 DIAGNOSIS — G894 Chronic pain syndrome: Secondary | ICD-10-CM | POA: Diagnosis not present

## 2015-12-20 DIAGNOSIS — F331 Major depressive disorder, recurrent, moderate: Secondary | ICD-10-CM | POA: Diagnosis not present

## 2015-12-20 DIAGNOSIS — I10 Essential (primary) hypertension: Secondary | ICD-10-CM | POA: Diagnosis not present

## 2015-12-20 DIAGNOSIS — Z6833 Body mass index (BMI) 33.0-33.9, adult: Secondary | ICD-10-CM | POA: Diagnosis not present

## 2016-01-05 DIAGNOSIS — F4310 Post-traumatic stress disorder, unspecified: Secondary | ICD-10-CM | POA: Diagnosis not present

## 2016-01-05 DIAGNOSIS — F39 Unspecified mood [affective] disorder: Secondary | ICD-10-CM | POA: Diagnosis not present

## 2016-01-11 ENCOUNTER — Ambulatory Visit: Payer: Medicare Other | Admitting: Pain Medicine

## 2016-01-13 ENCOUNTER — Encounter: Payer: Self-pay | Admitting: Pain Medicine

## 2016-01-15 DIAGNOSIS — J209 Acute bronchitis, unspecified: Secondary | ICD-10-CM | POA: Diagnosis not present

## 2016-01-17 ENCOUNTER — Ambulatory Visit: Payer: Medicare Other | Attending: Pain Medicine | Admitting: Pain Medicine

## 2016-01-17 ENCOUNTER — Encounter: Payer: Self-pay | Admitting: Pain Medicine

## 2016-01-17 VITALS — BP 142/86 | HR 90 | Temp 97.8°F | Resp 18 | Ht 68.0 in | Wt 218.0 lb

## 2016-01-17 DIAGNOSIS — M5126 Other intervertebral disc displacement, lumbar region: Secondary | ICD-10-CM | POA: Insufficient documentation

## 2016-01-17 DIAGNOSIS — M51369 Other intervertebral disc degeneration, lumbar region without mention of lumbar back pain or lower extremity pain: Secondary | ICD-10-CM

## 2016-01-17 DIAGNOSIS — M5136 Other intervertebral disc degeneration, lumbar region: Secondary | ICD-10-CM | POA: Diagnosis not present

## 2016-01-17 DIAGNOSIS — Z981 Arthrodesis status: Secondary | ICD-10-CM | POA: Diagnosis not present

## 2016-01-17 DIAGNOSIS — M47816 Spondylosis without myelopathy or radiculopathy, lumbar region: Secondary | ICD-10-CM

## 2016-01-17 DIAGNOSIS — M5416 Radiculopathy, lumbar region: Secondary | ICD-10-CM | POA: Diagnosis not present

## 2016-01-17 DIAGNOSIS — M533 Sacrococcygeal disorders, not elsewhere classified: Secondary | ICD-10-CM

## 2016-01-17 DIAGNOSIS — M79602 Pain in left arm: Secondary | ICD-10-CM | POA: Diagnosis present

## 2016-01-17 DIAGNOSIS — M79601 Pain in right arm: Secondary | ICD-10-CM | POA: Diagnosis present

## 2016-01-17 DIAGNOSIS — M791 Myalgia: Secondary | ICD-10-CM | POA: Diagnosis not present

## 2016-01-17 DIAGNOSIS — M542 Cervicalgia: Secondary | ICD-10-CM | POA: Diagnosis present

## 2016-01-17 DIAGNOSIS — M47817 Spondylosis without myelopathy or radiculopathy, lumbosacral region: Secondary | ICD-10-CM | POA: Diagnosis not present

## 2016-01-17 DIAGNOSIS — M5030 Other cervical disc degeneration, unspecified cervical region: Secondary | ICD-10-CM

## 2016-01-17 MED ORDER — OXYCODONE HCL 10 MG PO TABS: ORAL_TABLET | ORAL | Status: DC

## 2016-01-17 MED ORDER — CYCLOBENZAPRINE HCL 10 MG PO TABS: ORAL_TABLET | ORAL | Status: DC

## 2016-01-17 NOTE — Progress Notes (Signed)
Subjective:    Patient ID: Jill Robertson, female    DOB: 04-02-1961, 55 y.o.   MRN: [id number]  HPI  The patient is a 55 year old female who returns to pain management for further evaluation and treatment of pain involving the neck and upper extremity regions with prior surgical intervention of the cervical region lower back and lower extremity region. The patient states that the lower back pain radiates to the lower extremity especially on the left and the pain is aggravated by standing walking twisting turning maneuvers. The patient denies any recent trauma and states that the pain is present despite present treatment regimen. We will proceed with block of nerves to the sacroiliac joint at time return appointment in attempt to decrease severity of patient's symptoms, minimize progression of patient's symptoms, and avoid the need for more involved treatment. The patient agreed to suggested treatment plan. We discussed further surgical evaluation and will consider patient for radiofrequency rhizolysis lumbar facet, medial branch nerves as well. The patient was with understanding and in agreement with this treatment plan. The patient will continue medications consisting of Flexeril and oxycodone. The patient is to avoid naproxen ibuprofen and other nonsteroidal anti-inflammatory medications. We will proceed with block of nerves to the sacroiliac joint at time return appointment as discussed. All agreed to suggested treatment plan     Review of Systems     Objective:   Physical Exam  . There was tenderness to palpation of paraspinal musculature region cervical region cervical facet region palpation which be produced pain of mild-to-moderate degree. There was well-healed surgical scar of the cervical region without increased warmth and erythema in the region of the scar There was mild tenderness over the splenius capitis and occipitalis musculature region on the left as well as on the right. The  patient appeared to be with unremarkable Spurling's maneuver. Palpation of the acromioclavicular and glenohumeral joint region was attends to palpation of the mid and lower thoracic region a moderate degree. There was tenderness to palpation of the lower thoracic paraspinal musculature region on the left greater than right with no crepitus of the thoracic region noted. There was significant muscle spasms on the left lower thoracic region compared to the right lower thoracic region. Palpation over the PSIS and PII S region reproduced severe pain on the left compared to the right. Tenderness of the gluteal and piriformis musculature regions noted to be moderate to moderately severe on the left compared to mild-to-moderate on the right side. Palpation over the greater trochanteric region and iliotibial band region was attends to palpation of moderate degree Straight leg raising was tolerates approximately 20 without an increase of pain with dorsiflexion noted. There was negative clonus negative Homans. No definite sensory deficit or dermatomal dystrophy detected. The abdomen was nontender with no costovertebral tenderness noted      Assessment & Plan:     Degenerative disc disease lumbar spine L3 for disc desiccation. L4-L5 disc bulging. L5-S1 disc space narrowing with endplate spurring and cervical region disc bulging with progression of findings since prior study of 2015  Sacroiliac joint dysfunction  Degenerative disc disease lumbar spine  Lumbar facet syndrome  Degenerative disc disease cervical spine Status post C4-C7 ACDF     PLAN   Continue present medications Flexeril Lyrica and oxycodone for now as discussed  NO NAPROXEN  NO IBUPROFEN  Block of nerves to the sacroiliac joint to be performed at time of return appointment  F/U PCP  Latina Craver  for evaliation  of  BP and general medical  condition  F/U surgical evaluation. Follow-up with Dr. Sherley Bounds as discussed  F/U psych  evaluation as discussed  F/U neurological evaluation. May consider pending follow-up evaluations  May consider radiofrequency rhizolysis or intraspinal procedures pending response to present treatment and F/U evaluation . We will request insurance approval for radiofrequency lumbar facets on the left side  Patient to call [facility] should patient have concerns prior to scheduled return appointment

## 2016-01-17 NOTE — Progress Notes (Signed)
Safety precautions to be maintained throughout the outpatient stay will include: orient to surroundings, keep bed in low position, maintain call bell within reach at all times, provide assistance with transfer out of bed and ambulation.  

## 2016-01-17 NOTE — Patient Instructions (Addendum)
PLAN   Continue present medications Flexeril Lyrica and oxycodone for now as discussed  NO NAPROXEN  NO IBUPROFEN  Block of nerves to the sacroiliac joint to be performed at time of return appointment  F/U PCP  S Amin  for evaliation of  BP and general medical  condition  F/U surgical evaluation. Follow-up with Dr. Sherley Bounds as discussed  F/U psych evaluation as discussed  F/U neurological evaluation. May consider pending follow-up evaluations  May consider radiofrequency rhizolysis or intraspinal procedures pending response to present treatment and F/U evaluation . We will request insurance approval for radiofrequency lumbar facets on the left side  [facility] to [facility] should patient have concerns prior to scheduled return appointment.GENERAL RISKS AND COMPLICATIONS  What are the risk, side effects and possible complications? Generally speaking, most procedures are safe.  However, with any procedure there are risks, side effects, and the possibility of complications.  The risks and complications are dependent upon the sites that are lesioned, or the type of nerve block to be performed.  The closer the procedure is to the spine, the more serious the risks are.  Great care is taken when placing the radio frequency needles, block needles or lesioning probes, but sometimes complications can occur. 1. Infection: Any time there is an injection through the skin, there is a risk of infection.  This is why sterile conditions are used for these blocks.  There are four possible types of infection. 1. Localized skin infection. 2. Central Nervous System Infection-This can be in the form of Meningitis, which can be deadly. 3. Epidural Infections-This can be in the form of an epidural abscess, which can cause pressure inside of the spine, causing compression of the spinal cord with subsequent paralysis. This would require an emergency surgery to decompress, and there are no guarantees  that the patient would recover from the paralysis. 4. Discitis-This is an infection of the intervertebral discs.  It occurs in about 1% of discography procedures.  It is difficult to treat and it may lead to surgery.        2. Pain: the needles have to go through skin and soft tissues, will cause soreness.       3. Damage to internal structures:  The nerves to be lesioned may be near blood vessels or    other nerves which can be potentially damaged.       4. Bleeding: Bleeding is more common if the patient is taking blood thinners such as  aspirin, Coumadin, Ticiid, Plavix, etc., or if he/she have some genetic predisposition  such as hemophilia. Bleeding into the spinal canal can cause compression of the spinal  cord with subsequent paralysis.  This would require an emergency surgery to  decompress and there are no guarantees that the patient would recover from the  paralysis.       5. Pneumothorax:  Puncturing of a lung is a possibility, every time a needle is introduced in  the area of the chest or upper back.  Pneumothorax refers to free air around the  collapsed lung(s), inside of the thoracic cavity (chest cavity).  Another two possible  complications related to a similar event would include: Hemothorax and Chylothorax.   These are variations of the Pneumothorax, where instead of air around the collapsed  lung(s), you may have blood or chyle, respectively.       6. Spinal headaches: They may occur with any procedures in the area of the spine.  7. Persistent CSF (Cerebro-Spinal Fluid) leakage: This is a rare problem, but may occur  with prolonged intrathecal or epidural catheters either due to the formation of a fistulous  track or a dural tear.       8. Nerve damage: By working so close to the spinal cord, there is always a possibility of  nerve damage, which could be as serious as a permanent spinal cord injury with  paralysis.       9. Death:  Although rare, severe deadly allergic reactions  known as "Anaphylactic  reaction" can occur to any of the medications used.      10. Worsening of the symptoms:  We can always make thing worse.  What are the chances of something like this happening? Chances of any of this occuring are extremely low.  By statistics, you have more of a chance of getting killed in a motor vehicle accident: while driving to the hospital than any of the above occurring .  Nevertheless, you should be aware that they are possibilities.  In general, it is similar to taking a shower.  Everybody knows that you can slip, hit your head and get killed.  Does that mean that you should not shower again?  Nevertheless always keep in mind that statistics do not mean anything if you happen to be on the wrong side of them.  Even if a procedure has a 1 (one) in a 1,000,000 (million) chance of going wrong, it you happen to be that one..Also, keep in mind that by statistics, you have more of a chance of having something go wrong when taking medications.  Who should not have this procedure? If you are on a blood thinning medication (e.g. Coumadin, Plavix, see list of "Blood Thinners"), or if you have an active infection going on, you should not have the procedure.  If you are taking any blood thinners, please inform your physician.  How should I prepare for this procedure?  Do not eat or drink anything at least six hours prior to the procedure.  Bring a driver with you .  It cannot be a taxi.  Come accompanied by an adult that can drive you back, and that is strong enough to help you if your legs get weak or numb from the local anesthetic.  Take all of your medicines the morning of the procedure with just enough water to swallow them.  If you have diabetes, make sure that you are scheduled to have your procedure done first thing in the morning, whenever possible.  If you have diabetes, take only half of your insulin dose and notify our nurse that you have done so as soon as you  arrive at the clinic.  If you are diabetic, but only take blood sugar pills (oral hypoglycemic), then do not take them on the morning of your procedure.  You may take them after you have had the procedure.  Do not take aspirin or any aspirin-containing medications, at least eleven (11) days prior to the procedure.  They may prolong bleeding.  Wear loose fitting clothing that may be easy to take off and that you would not mind if it got stained with Betadine or blood.  Do not wear any jewelry or perfume  Remove any nail coloring.  It will interfere with some of our monitoring equipment.  NOTE: Remember that this is not meant to be interpreted as a complete list of all possible complications.  Unforeseen problems may occur.  BLOOD THINNERS  The following drugs contain aspirin or other products, which can cause increased bleeding during surgery and should not be taken for 2 weeks prior to and 1 week after surgery.  If you should need take something for relief of minor pain, you may take acetaminophen which is found in Tylenol,m Datril, Anacin-3 and Panadol. It is not blood thinner. The products listed below are.  Do not take any of the products listed below in addition to any listed on your instruction sheet.  A.P.C or A.P.C with Codeine Codeine Phosphate Capsules #3 Ibuprofen Ridaura  ABC compound Congesprin Imuran rimadil  Advil Cope Indocin Robaxisal  Alka-Seltzer Effervescent Pain Reliever and Antacid Coricidin or Coricidin-D  Indomethacin Rufen  Alka-Seltzer plus Cold Medicine Cosprin Ketoprofen S-A-C Tablets  Anacin Analgesic Tablets or Capsules Coumadin Korlgesic Salflex  Anacin Extra Strength Analgesic tablets or capsules CP-2 Tablets Lanoril Salicylate  Anaprox Cuprimine Capsules Levenox Salocol  Anexsia-D Dalteparin Magan Salsalate  Anodynos Darvon compound Magnesium Salicylate Sine-off  Ansaid Dasin Capsules Magsal Sodium Salicylate  Anturane Depen Capsules Marnal Soma  APF  Arthritis pain formula Dewitt's Pills Measurin Stanback  Argesic Dia-Gesic Meclofenamic Sulfinpyrazone  Arthritis Bayer Timed Release Aspirin Diclofenac Meclomen Sulindac  Arthritis pain formula Anacin Dicumarol Medipren Supac  Analgesic (Safety coated) Arthralgen Diffunasal Mefanamic Suprofen  Arthritis Strength Bufferin Dihydrocodeine Mepro Compound Suprol  Arthropan liquid Dopirydamole Methcarbomol with Aspirin Synalgos  ASA tablets/Enseals Disalcid Micrainin Tagament  Ascriptin Doan's Midol Talwin  Ascriptin A/D Dolene Mobidin Tanderil  Ascriptin Extra Strength Dolobid Moblgesic Ticlid  Ascriptin with Codeine Doloprin or Doloprin with Codeine Momentum Tolectin  Asperbuf Duoprin Mono-gesic Trendar  Aspergum Duradyne Motrin or Motrin IB Triminicin  Aspirin plain, buffered or enteric coated Durasal Myochrisine Trigesic  Aspirin Suppositories Easprin Nalfon Trillsate  Aspirin with Codeine Ecotrin Regular or Extra Strength Naprosyn Uracel  Atromid-S Efficin Naproxen Ursinus  Auranofin Capsules Elmiron Neocylate Vanquish  Axotal Emagrin Norgesic Verin  Azathioprine Empirin or Empirin with Codeine Normiflo Vitamin E  Azolid Emprazil Nuprin Voltaren  Bayer Aspirin plain, buffered or children's or timed BC Tablets or powders Encaprin Orgaran Warfarin Sodium  Buff-a-Comp Enoxaparin Orudis Zorpin  Buff-a-Comp with Codeine Equegesic Os-Cal-Gesic   Buffaprin Excedrin plain, buffered or Extra Strength Oxalid   Bufferin Arthritis Strength Feldene Oxphenbutazone   Bufferin plain or Extra Strength Feldene Capsules Oxycodone with Aspirin   Bufferin with Codeine Fenoprofen Fenoprofen Pabalate or Pabalate-SF   Buffets II Flogesic Panagesic   Buffinol plain or Extra Strength Florinal or Florinal with Codeine Panwarfarin   Buf-Tabs Flurbiprofen Penicillamine   Butalbital Compound Four-way cold tablets Penicillin   Butazolidin Fragmin Pepto-Bismol   Carbenicillin Geminisyn Percodan   Carna Arthritis  Reliever Geopen Persantine   Carprofen Gold's salt Persistin   Chloramphenicol Goody's Phenylbutazone   Chloromycetin Haltrain Piroxlcam   Clmetidine heparin Plaquenil   Cllnoril Hyco-pap Ponstel   Clofibrate Hydroxy chloroquine Propoxyphen         Before stopping any of these medications, be sure to consult the physician who ordered them.  Some, such as Coumadin (Warfarin) are ordered to prevent or treat serious conditions such as "deep thrombosis", "pumonary embolisms", and other heart problems.  The amount of time that you may need off of the medication may also vary with the medication and the reason for which you were taking it.  If you are taking any of these medications, please make sure you notify your pain physician before you undergo any procedures.         Sacroiliac (SI) Joint  Injection Patient Information  Description: The sacroiliac joint connects the scrum (very low back and tailbone) to the ilium (a pelvic bone which also forms half of the hip joint).  Normally this joint experiences very little motion.  When this joint becomes inflamed or unstable low back and or hip and pelvis pain may result.  Injection of this joint with local anesthetics (numbing medicines) and steroids can provide diagnostic information and reduce pain.  This injection is performed with the aid of x-ray guidance into the tailbone area while you are lying on your stomach.   You may experience an electrical sensation down the leg while this is being done.  You may also experience numbness.  We also may ask if we are reproducing your normal pain during the injection.  Conditions which may be treated SI injection:   Low back, buttock, hip or leg pain  Preparation for the Injection:  1. Do not eat any solid food or dairy products within 8 hours of your appointment.  2. You may drink clear liquids up to 3 hours before appointment.  Clear liquids include water, black coffee, juice or soda.  No milk or  cream please. 3. You may take your regular medications, including pain medications with a sip of water before your appointment.  Diabetics should hold regular insulin (if take separately) and take 1/2 normal NPH dose the morning of the procedure.  Carry some sugar containing items with you to your appointment. 4. A driver must accompany you and be prepared to drive you home after your procedure. 5. Bring all of your current medications with you. 6. An IV may be inserted and sedation may be given at the discretion of the physician. 7. A blood pressure cuff, EKG and other monitors will often be applied during the procedure.  Some patients may need to have extra oxygen administered for a short period.  8. You will be asked to provide medical information, including your allergies, prior to the procedure.  We must know immediately if you are taking blood thinners (like Coumadin/Warfarin) or if you are allergic to IV iodine contrast (dye).  We must know if you could possible be pregnant.  Possible side effects:   Bleeding from needle site  Infection (rare, may require surgery)  Nerve injury (rare)  Numbness & tingling (temporary)  A brief convulsion or seizure  Light-headedness (temporary)  Pain at injection site (several days)  Decreased blood pressure (temporary)  Weakness in the leg (temporary)   Call if you experience:   New onset weakness or numbness of an extremity below the injection site that last more than 8 hours.  Hives or difficulty breathing ( go to the emergency room)  Inflammation or drainage at the injection site  Any new symptoms which are concerning to you  Please note:  Although the local anesthetic injected can often make your back/ hip/ buttock/ leg feel good for several hours after the injections, the pain will likely return.  It takes 3-7 days for steroids to work in the sacroiliac area.  You may not notice any pain relief for at least that one week.  If  effective, we will often do a series of three injections spaced 3-6 weeks apart to maximally decrease your pain.  After the initial series, we generally will wait some months before a repeat injection of the same type.  If you have any questions, please call [phone] [facility]

## 2016-01-23 ENCOUNTER — Encounter: Payer: Self-pay | Admitting: Pain Medicine

## 2016-01-23 ENCOUNTER — Ambulatory Visit: Payer: Medicare Other | Attending: Pain Medicine | Admitting: Pain Medicine

## 2016-01-23 VITALS — BP 111/71 | HR 78 | Temp 98.8°F | Resp 20 | Ht 68.0 in | Wt 218.0 lb

## 2016-01-23 DIAGNOSIS — M5030 Other cervical disc degeneration, unspecified cervical region: Secondary | ICD-10-CM

## 2016-01-23 DIAGNOSIS — M545 Low back pain: Secondary | ICD-10-CM | POA: Insufficient documentation

## 2016-01-23 DIAGNOSIS — M791 Myalgia: Secondary | ICD-10-CM | POA: Diagnosis not present

## 2016-01-23 DIAGNOSIS — M47816 Spondylosis without myelopathy or radiculopathy, lumbar region: Secondary | ICD-10-CM

## 2016-01-23 DIAGNOSIS — Z981 Arthrodesis status: Secondary | ICD-10-CM

## 2016-01-23 DIAGNOSIS — M5416 Radiculopathy, lumbar region: Secondary | ICD-10-CM

## 2016-01-23 DIAGNOSIS — M5136 Other intervertebral disc degeneration, lumbar region: Secondary | ICD-10-CM | POA: Diagnosis not present

## 2016-01-23 DIAGNOSIS — M533 Sacrococcygeal disorders, not elsewhere classified: Secondary | ICD-10-CM

## 2016-01-23 DIAGNOSIS — M79606 Pain in leg, unspecified: Secondary | ICD-10-CM | POA: Diagnosis present

## 2016-01-23 DIAGNOSIS — M5126 Other intervertebral disc displacement, lumbar region: Secondary | ICD-10-CM | POA: Diagnosis not present

## 2016-01-23 DIAGNOSIS — M47817 Spondylosis without myelopathy or radiculopathy, lumbosacral region: Secondary | ICD-10-CM | POA: Diagnosis not present

## 2016-01-23 MED ORDER — FENTANYL CITRATE (PF) 100 MCG/2ML IJ SOLN: 100.0000 ug | Freq: Once | INTRAMUSCULAR | Status: DC

## 2016-01-23 MED ORDER — MIDAZOLAM HCL 5 MG/5ML IJ SOLN
INTRAMUSCULAR | Status: AC
  Administered 2016-01-23: 5 mg via INTRAVENOUS
  Filled 2016-01-23: qty 5

## 2016-01-23 MED ORDER — FENTANYL CITRATE (PF) 100 MCG/2ML IJ SOLN
INTRAMUSCULAR | Status: AC
  Administered 2016-01-23: 100 ug via INTRAVENOUS
  Filled 2016-01-23: qty 2

## 2016-01-23 MED ORDER — CEFAZOLIN SODIUM 1 G IJ SOLR
INTRAMUSCULAR | Status: AC
  Administered 2016-01-23: 1 g
  Filled 2016-01-23: qty 10

## 2016-01-23 MED ORDER — CEFUROXIME AXETIL 250 MG PO TABS: 250.0000 mg | ORAL_TABLET | Freq: Two times a day (BID) | ORAL | Status: DC

## 2016-01-23 MED ORDER — ORPHENADRINE CITRATE 30 MG/ML IJ SOLN: 60.0000 mg | Freq: Once | INTRAMUSCULAR | Status: DC

## 2016-01-23 MED ORDER — TRIAMCINOLONE ACETONIDE 40 MG/ML IJ SUSP: 40.0000 mg | Freq: Once | INTRAMUSCULAR | Status: DC

## 2016-01-23 MED ORDER — BUPIVACAINE HCL (PF) 0.25 % IJ SOLN
INTRAMUSCULAR | Status: AC
  Administered 2016-01-23: 13:00:00
  Filled 2016-01-23: qty 30

## 2016-01-23 MED ORDER — LACTATED RINGERS IV SOLN: 1000.0000 mL | INTRAVENOUS | Status: DC

## 2016-01-23 MED ORDER — MIDAZOLAM HCL 5 MG/5ML IJ SOLN: 5.0000 mg | Freq: Once | INTRAMUSCULAR | Status: DC

## 2016-01-23 MED ORDER — TRIAMCINOLONE ACETONIDE 40 MG/ML IJ SUSP
INTRAMUSCULAR | Status: AC
  Administered 2016-01-23: 13:00:00
  Filled 2016-01-23: qty 1

## 2016-01-23 MED ORDER — BUPIVACAINE HCL (PF) 0.25 % IJ SOLN
30.0000 mL | Freq: Once | INTRAMUSCULAR | Status: DC
Start: 2016-01-23 — End: 2023-01-21

## 2016-01-23 MED ORDER — CEFAZOLIN SODIUM 1-5 GM-% IV SOLN: 1.0000 g | Freq: Once | INTRAVENOUS | Status: DC

## 2016-01-23 MED ORDER — ORPHENADRINE CITRATE 30 MG/ML IJ SOLN
INTRAMUSCULAR | Status: AC
  Administered 2016-01-23: 13:00:00
  Filled 2016-01-23: qty 2

## 2016-01-23 NOTE — Patient Instructions (Addendum)
PLAN   Continue present medications for now as discussed . Please obtain Ceftin antibiotic and begin taking Ceftin antibiotic today as prescribed  F/U PCP  S Amin  for evaliation of  BP and general medical  condition  F/U surgical evaluation. Follow-up with Dr. Sherley Bounds as discussed  F/U neurological evaluation. May consider PNCV/EMG studies and other studies pending follow-up evaluations  May consider radiofrequency rhizolysis or intraspinal procedures pending response to present treatment and F/U evaluation Pain ManagementGENERAL RISKS AND COMPLICATIONS  What are the risk, side effects and possible complications? Generally speaking, most procedures are safe.  However, with any procedure there are risks, side effects, and the possibility of complications.  The risks and complications are dependent upon the sites that are lesioned, or the type of nerve block to be performed.  The closer the procedure is to the spine, the more serious the risks are.  Great care is taken when placing the radio frequency needles, block needles or lesioning probes, but sometimes complications can occur. 1. Infection: Any time there is an injection through the skin, there is a risk of infection.  This is why sterile conditions are used for these blocks.  There are four possible types of infection. 1. Localized skin infection. 2. Central Nervous System Infection-This can be in the form of Meningitis, which can be deadly. 3. Epidural Infections-This can be in the form of an epidural abscess, which can cause pressure inside of the spine, causing compression of the spinal cord with subsequent paralysis. This would require an emergency surgery to decompress, and there are no guarantees that the patient would recover from the paralysis. 4. Discitis-This is an infection of the intervertebral discs.  It occurs in about 1% of discography procedures.  It is difficult to treat and it may lead to surgery.        2. Pain: the  needles have to go through skin and soft tissues, will cause soreness.       3. Damage to internal structures:  The nerves to be lesioned may be near blood vessels or    other nerves which can be potentially damaged.       4. Bleeding: Bleeding is more common if the patient is taking blood thinners such as  aspirin, Coumadin, Ticiid, Plavix, etc., or if he/she have some genetic predisposition  such as hemophilia. Bleeding into the spinal canal can cause compression of the spinal  cord with subsequent paralysis.  This would require an emergency surgery to  decompress and there are no guarantees that the patient would recover from the  paralysis.       5. Pneumothorax:  Puncturing of a lung is a possibility, every time a needle is introduced in  the area of the chest or upper back.  Pneumothorax refers to free air around the  collapsed lung(s), inside of the thoracic cavity (chest cavity).  Another two possible  complications related to a similar event would include: Hemothorax and Chylothorax.   These are variations of the Pneumothorax, where instead of air around the collapsed  lung(s), you may have blood or chyle, respectively.       6. Spinal headaches: They may occur with any procedures in the area of the spine.       7. Persistent CSF (Cerebro-Spinal Fluid) leakage: This is a rare problem, but may occur  with prolonged intrathecal or epidural catheters either due to the formation of a fistulous  track or a dural tear.  8. Nerve damage: By working so close to the spinal cord, there is always a possibility of  nerve damage, which could be as serious as a permanent spinal cord injury with  paralysis.       9. Death:  Although rare, severe deadly allergic reactions known as "Anaphylactic  reaction" can occur to any of the medications used.      10. Worsening of the symptoms:  We can always make thing worse.  What are the chances of something like this happening? Chances of any of this occuring are  extremely low.  By statistics, you have more of a chance of getting killed in a motor vehicle accident: while driving to the hospital than any of the above occurring .  Nevertheless, you should be aware that they are possibilities.  In general, it is similar to taking a shower.  Everybody knows that you can slip, hit your head and get killed.  Does that mean that you should not shower again?  Nevertheless always keep in mind that statistics do not mean anything if you happen to be on the wrong side of them.  Even if a procedure has a 1 (one) in a 1,000,000 (million) chance of going wrong, it you happen to be that one..Also, keep in mind that by statistics, you have more of a chance of having something go wrong when taking medications.  Who should not have this procedure? If you are on a blood thinning medication (e.g. Coumadin, Plavix, see list of "Blood Thinners"), or if you have an active infection going on, you should not have the procedure.  If you are taking any blood thinners, please inform your physician.  How should I prepare for this procedure?  Do not eat or drink anything at least six hours prior to the procedure.  Bring a driver with you .  It cannot be a taxi.  Come accompanied by an adult that can drive you back, and that is strong enough to help you if your legs get weak or numb from the local anesthetic.  Take all of your medicines the morning of the procedure with just enough water to swallow them.  If you have diabetes, make sure that you are scheduled to have your procedure done first thing in the morning, whenever possible.  If you have diabetes, take only half of your insulin dose and notify our nurse that you have done so as soon as you arrive at the clinic.  If you are diabetic, but only take blood sugar pills (oral hypoglycemic), then do not take them on the morning of your procedure.  You may take them after you have had the procedure.  Do not take aspirin or any  aspirin-containing medications, at least eleven (11) days prior to the procedure.  They may prolong bleeding.  Wear loose fitting clothing that may be easy to take off and that you would not mind if it got stained with Betadine or blood.  Do not wear any jewelry or perfume  Remove any nail coloring.  It will interfere with some of our monitoring equipment.  NOTE: Remember that this is not meant to be interpreted as a complete list of all possible complications.  Unforeseen problems may occur.  BLOOD THINNERS The following drugs contain aspirin or other products, which can cause increased bleeding during surgery and should not be taken for 2 weeks prior to and 1 week after surgery.  If you should need take something for relief of minor  pain, you may take acetaminophen which is found in Tylenol,m Datril, Anacin-3 and Panadol. It is not blood thinner. The products listed below are.  Do not take any of the products listed below in addition to any listed on your instruction sheet.  A.P.C or A.P.C with Codeine Codeine Phosphate Capsules #3 Ibuprofen Ridaura  ABC compound Congesprin Imuran rimadil  Advil Cope Indocin Robaxisal  Alka-Seltzer Effervescent Pain Reliever and Antacid Coricidin or Coricidin-D  Indomethacin Rufen  Alka-Seltzer plus Cold Medicine Cosprin Ketoprofen S-A-C Tablets  Anacin Analgesic Tablets or Capsules Coumadin Korlgesic Salflex  Anacin Extra Strength Analgesic tablets or capsules CP-2 Tablets Lanoril Salicylate  Anaprox Cuprimine Capsules Levenox Salocol  Anexsia-D Dalteparin Magan Salsalate  Anodynos Darvon compound Magnesium Salicylate Sine-off  Ansaid Dasin Capsules Magsal Sodium Salicylate  Anturane Depen Capsules Marnal Soma  APF Arthritis pain formula Dewitt's Pills Measurin Stanback  Argesic Dia-Gesic Meclofenamic Sulfinpyrazone  Arthritis Bayer Timed Release Aspirin Diclofenac Meclomen Sulindac  Arthritis pain formula Anacin Dicumarol Medipren Supac  Analgesic  (Safety coated) Arthralgen Diffunasal Mefanamic Suprofen  Arthritis Strength Bufferin Dihydrocodeine Mepro Compound Suprol  Arthropan liquid Dopirydamole Methcarbomol with Aspirin Synalgos  ASA tablets/Enseals Disalcid Micrainin Tagament  Ascriptin Doan's Midol Talwin  Ascriptin A/D Dolene Mobidin Tanderil  Ascriptin Extra Strength Dolobid Moblgesic Ticlid  Ascriptin with Codeine Doloprin or Doloprin with Codeine Momentum Tolectin  Asperbuf Duoprin Mono-gesic Trendar  Aspergum Duradyne Motrin or Motrin IB Triminicin  Aspirin plain, buffered or enteric coated Durasal Myochrisine Trigesic  Aspirin Suppositories Easprin Nalfon Trillsate  Aspirin with Codeine Ecotrin Regular or Extra Strength Naprosyn Uracel  Atromid-S Efficin Naproxen Ursinus  Auranofin Capsules Elmiron Neocylate Vanquish  Axotal Emagrin Norgesic Verin  Azathioprine Empirin or Empirin with Codeine Normiflo Vitamin E  Azolid Emprazil Nuprin Voltaren  Bayer Aspirin plain, buffered or children's or timed BC Tablets or powders Encaprin Orgaran Warfarin Sodium  Buff-a-Comp Enoxaparin Orudis Zorpin  Buff-a-Comp with Codeine Equegesic Os-Cal-Gesic   Buffaprin Excedrin plain, buffered or Extra Strength Oxalid   Bufferin Arthritis Strength Feldene Oxphenbutazone   Bufferin plain or Extra Strength Feldene Capsules Oxycodone with Aspirin   Bufferin with Codeine Fenoprofen Fenoprofen Pabalate or Pabalate-SF   Buffets II Flogesic Panagesic   Buffinol plain or Extra Strength Florinal or Florinal with Codeine Panwarfarin   Buf-Tabs Flurbiprofen Penicillamine   Butalbital Compound Four-way cold tablets Penicillin   Butazolidin Fragmin Pepto-Bismol   Carbenicillin Geminisyn Percodan   Carna Arthritis Reliever Geopen Persantine   Carprofen Gold's salt Persistin   Chloramphenicol Goody's Phenylbutazone   Chloromycetin Haltrain Piroxlcam   Clmetidine heparin Plaquenil   Cllnoril Hyco-pap Ponstel   Clofibrate Hydroxy chloroquine  Propoxyphen         Before stopping any of these medications, be sure to consult the physician who ordered them.  Some, such as Coumadin (Warfarin) are ordered to prevent or treat serious conditions such as "deep thrombosis", "pumonary embolisms", and other heart problems.  The amount of time that you may need off of the medication may also vary with the medication and the reason for which you were taking it.  If you are taking any of these medications, please make sure you notify your pain physician before you undergo any procedures.

## 2016-01-23 NOTE — Progress Notes (Signed)
Subjective:    Patient ID: Baldemar Lenis, female    DOB: 11/04/1960, 55 y.o.   MRN: [id number]  HPI   PROCEDURE:  Block of nerves to the sacroiliac joint.   NOTE:  The patient is a 55 y.o. female who [facility] the [facility] for further evaluation and treatment of pain involving the lower back and lower extremity region with pain in the region of the buttocks as well. Prior MRI studies reveal degenerative disc disease lumbar spine L3 for disc desiccation. L4-L5 disc bulging. L5-S1 disc space narrowing with endplate spurring and cervical region disc bulging with progression of findings since prior study of 2015.  the patient is with reproduction of severe pain with palpation over the PSIS and PII S regions  There is concern regarding a significant component of the patient's pain being due to sacroiliac joint dysfunction The risks, benefits, expectations of the procedure have been discussed and explained to the patient who is understanding and willing to proceed with interventional treatment in attempt to decrease severity of patient's symptoms, minimize the risk of medication escalation and  hopefully retard the progression of the patient's symptoms. We will proceed with what is felt to be a medically necessary procedure, block of nerves to the sacroiliac joint.   DESCRIPTION OF PROCEDURE:  Block of nerves to the sacroiliac joint.   The patient was taken to the fluoroscopy suite. With the patient in the prone position with EKG, blood pressure, pulse and pulse oximetry monitoring, IV Versed, IV fentanyl conscious sedation, Betadine prep of proposed entry site was performed.   Block of nerves at the L5 vertebral body level.   With the patient in prone position, under fluoroscopic guidance, a 22 -gauge needle was inserted at the L5 vertebral body level on the left left side. With 15 degrees oblique orientation a 22 -gauge needle was inserted in the region known as Burton's eye or eye of  the Scotty dog. Following documentation of needle placement in the area of Burton's eye or eye of the Scotty dog under fluoroscopic guidance, needle placement was then accomplished at the sacral ala level on the left side.   Needle placement at the sacral ala.   With the patient in prone position under fluoroscopic guidance with AP view of the lumbosacral spine, a 22 -gauge needle was inserted in the region known as the sacral ala on the left side. Following documentation of needle placement on the left side under fluoroscopic guidance needle placement was then accomplished at the S1 foramen level.   Needle placement at the S1 foramen level.   With the patient in prone position under fluoroscopic guidance with AP view of the lumbosacral spine and cephalad orientation, a 22 -gauge needle was inserted at the superior and lateral border of the S1 foramen on the left side. Following documentation of needle placement at the S1 foramen level on the left side, needle placement was then accomplished at the S2 foramen level on the left side.   Needle placement at the S2 foramen level.   With the patient in prone position with AP view of the lumbosacral spine with cephalad orientation, a 22 - gauge needle was inserted at the superior and lateral border of the S2 foramen under fluoroscopic guidance on the left side. Following needle placement at the L5 vertebral body level, sacral ala, S1 foramen and S2 foramen on the left side, needle placement was verified on lateral view under fluoroscopic guidance.  Following needle placement documentation  on lateral view, each needle was injected with 1 mL of 0.25% bupivacaine and Kenalog.   BLOCK OF THE NERVES TO SACROILIAC JOINT ON THE RIGHT SIDE The procedure was performed on the right side at the same levels as was performed on the left side and utilizing the same technique as on the left side and was performed under fluoroscopic guidance as on the left  side  Myoneural block injections of the gluteal musculature region Following Betadine prep of proposed entry site a 22-gauge needle was inserted in the gluteal musculature region and following negative aspiration 2 cc of 0.25% bupivacaine with Norflex was injected for myoneural block injection of the gluteal musculature region times two.   The patient tolerated the procedure well  A total of 10mg  of Kenalog was utilized for the procedure.   PLAN:  1. Medications: The patient will continue presently prescribed medications Flexeril and oxycodone  2. The patient will be considered for modification of treatment regimen pending response to the procedure performed on today's visit.  3. The patient is to follow-up with primary care physician Dr.Amin  for evaluation of blood pressure and general medical condition following the procedure performed on today's visit.  4. Surgical evaluation as discussed.  5. Neurological evaluation as discussed.  6. The patient may be a candidate for radiofrequency procedures, implantation devices and other treatment pending response to treatment performed on today's visit and follow-up evaluation.  7. The patient has been advised to adhere to proper body mechanics and to avoid activities which may exacerbate the patient's symptoms.   Return appointment to [facility] as scheduled.        Review of Systems     Objective:   Physical Exam        Assessment & Plan:

## 2016-01-23 NOTE — Progress Notes (Signed)
Patient here for procedure d/t low back pain. Safety precautions to be maintained throughout the outpatient stay will include: orient to surroundings, keep bed in low position, maintain call bell within reach at all times, provide assistance with transfer out of bed and ambulation.  

## 2016-01-24 ENCOUNTER — Telehealth: Payer: Self-pay | Admitting: *Deleted

## 2016-01-24 DIAGNOSIS — M549 Dorsalgia, unspecified: Secondary | ICD-10-CM | POA: Diagnosis not present

## 2016-01-24 DIAGNOSIS — I10 Essential (primary) hypertension: Secondary | ICD-10-CM | POA: Diagnosis not present

## 2016-01-24 DIAGNOSIS — F331 Major depressive disorder, recurrent, moderate: Secondary | ICD-10-CM | POA: Diagnosis not present

## 2016-01-24 DIAGNOSIS — E039 Hypothyroidism, unspecified: Secondary | ICD-10-CM | POA: Diagnosis not present

## 2016-01-24 DIAGNOSIS — E785 Hyperlipidemia, unspecified: Secondary | ICD-10-CM | POA: Diagnosis not present

## 2016-01-24 DIAGNOSIS — G894 Chronic pain syndrome: Secondary | ICD-10-CM | POA: Diagnosis not present

## 2016-01-24 NOTE — Telephone Encounter (Signed)
No problems post procedure. 

## 2016-01-26 ENCOUNTER — Other Ambulatory Visit: Payer: Self-pay | Admitting: Pain Medicine

## 2016-02-14 ENCOUNTER — Encounter: Payer: Self-pay | Admitting: Pain Medicine

## 2016-02-14 ENCOUNTER — Ambulatory Visit: Payer: Medicare Other | Attending: Pain Medicine | Admitting: Pain Medicine

## 2016-02-14 VITALS — BP 131/84 | HR 86 | Temp 97.8°F | Resp 16 | Ht 68.0 in | Wt 218.0 lb

## 2016-02-14 DIAGNOSIS — M5030 Other cervical disc degeneration, unspecified cervical region: Secondary | ICD-10-CM

## 2016-02-14 DIAGNOSIS — M545 Low back pain: Secondary | ICD-10-CM | POA: Diagnosis present

## 2016-02-14 DIAGNOSIS — M19012 Primary osteoarthritis, left shoulder: Secondary | ICD-10-CM

## 2016-02-14 DIAGNOSIS — M533 Sacrococcygeal disorders, not elsewhere classified: Secondary | ICD-10-CM | POA: Insufficient documentation

## 2016-02-14 DIAGNOSIS — G588 Other specified mononeuropathies: Secondary | ICD-10-CM | POA: Diagnosis not present

## 2016-02-14 DIAGNOSIS — M5136 Other intervertebral disc degeneration, lumbar region: Secondary | ICD-10-CM | POA: Diagnosis not present

## 2016-02-14 DIAGNOSIS — M5020 Other cervical disc displacement, unspecified cervical region: Secondary | ICD-10-CM | POA: Diagnosis not present

## 2016-02-14 DIAGNOSIS — M461 Sacroiliitis, not elsewhere classified: Secondary | ICD-10-CM | POA: Diagnosis not present

## 2016-02-14 DIAGNOSIS — Z981 Arthrodesis status: Secondary | ICD-10-CM

## 2016-02-14 DIAGNOSIS — M791 Myalgia: Secondary | ICD-10-CM | POA: Diagnosis not present

## 2016-02-14 DIAGNOSIS — M47816 Spondylosis without myelopathy or radiculopathy, lumbar region: Secondary | ICD-10-CM

## 2016-02-14 DIAGNOSIS — Z8781 Personal history of (healed) traumatic fracture: Secondary | ICD-10-CM | POA: Insufficient documentation

## 2016-02-14 DIAGNOSIS — M19019 Primary osteoarthritis, unspecified shoulder: Secondary | ICD-10-CM | POA: Insufficient documentation

## 2016-02-14 DIAGNOSIS — M5126 Other intervertebral disc displacement, lumbar region: Secondary | ICD-10-CM | POA: Insufficient documentation

## 2016-02-14 DIAGNOSIS — M546 Pain in thoracic spine: Secondary | ICD-10-CM | POA: Diagnosis present

## 2016-02-14 DIAGNOSIS — M47817 Spondylosis without myelopathy or radiculopathy, lumbosacral region: Secondary | ICD-10-CM | POA: Diagnosis not present

## 2016-02-14 DIAGNOSIS — M19011 Primary osteoarthritis, right shoulder: Secondary | ICD-10-CM

## 2016-02-14 DIAGNOSIS — M5416 Radiculopathy, lumbar region: Secondary | ICD-10-CM | POA: Diagnosis not present

## 2016-02-14 MED ORDER — CYCLOBENZAPRINE HCL 10 MG PO TABS: ORAL_TABLET | ORAL | Status: DC

## 2016-02-14 MED ORDER — OXYCODONE HCL 10 MG PO TABS: ORAL_TABLET | ORAL | Status: DC

## 2016-02-14 NOTE — Patient Instructions (Addendum)
PLAN   Continue present medications Flexeril Lyrica and oxycodone for now as discussed  NO NAPROXEN  NO IBUPROFEN  Intercostal nerve blocks to be performed at time of return appointment  F/U PCP  S Amin  for evaliation of  BP and general medical  condition  F/U surgical evaluation. Follow-up with Dr. Sherley Bounds as discussed  F/U psych evaluation as discussed  F/U neurological evaluation. May consider pending follow-up evaluations  May consider radiofrequency rhizolysis or intraspinal procedures pending response to present treatment and F/U evaluation . We will request insurance approval for radiofrequency lumbar facets on the left side  Patient to call [facility] should patient have concerns prior to scheduled return appointment.GENERAL RISKS AND COMPLICATIONS  What are the risk, side effects and possible complications? Generally speaking, most procedures are safe.  However, with any procedure there are risks, side effects, and the possibility of complications.  The risks and complications are dependent upon the sites that are lesioned, or the type of nerve block to be performed.  The closer the procedure is to the spine, the more serious the risks are.  Great care is taken when placing the radio frequency needles, block needles or lesioning probes, but sometimes complications can occur. 1. Infection: Any time there is an injection through the skin, there is a risk of infection.  This is why sterile conditions are used for these blocks.  There are four possible types of infection. 1. Localized skin infection. 2. Central Nervous System Infection-This can be in the form of Meningitis, which can be deadly. 3. Epidural Infections-This can be in the form of an epidural abscess, which can cause pressure inside of the spine, causing compression of the spinal cord with subsequent paralysis. This would require an emergency surgery to decompress, and there are no guarantees that the  patient would recover from the paralysis. 4. Discitis-This is an infection of the intervertebral discs.  It occurs in about 1% of discography procedures.  It is difficult to treat and it may lead to surgery.        2. Pain: the needles have to go through skin and soft tissues, will cause soreness.       3. Damage to internal structures:  The nerves to be lesioned may be near blood vessels or    other nerves which can be potentially damaged.       4. Bleeding: Bleeding is more common if the patient is taking blood thinners such as  aspirin, Coumadin, Ticiid, Plavix, etc., or if he/she have some genetic predisposition  such as hemophilia. Bleeding into the spinal canal can cause compression of the spinal  cord with subsequent paralysis.  This would require an emergency surgery to  decompress and there are no guarantees that the patient would recover from the  paralysis.       5. Pneumothorax:  Puncturing of a lung is a possibility, every time a needle is introduced in  the area of the chest or upper back.  Pneumothorax refers to free air around the  collapsed lung(s), inside of the thoracic cavity (chest cavity).  Another two possible  complications related to a similar event would include: Hemothorax and Chylothorax.   These are variations of the Pneumothorax, where instead of air around the collapsed  lung(s), you may have blood or chyle, respectively.       6. Spinal headaches: They may occur with any procedures in the area of the spine.  7. Persistent CSF (Cerebro-Spinal Fluid) leakage: This is a rare problem, but may occur  with prolonged intrathecal or epidural catheters either due to the formation of a fistulous  track or a dural tear.       8. Nerve damage: By working so close to the spinal cord, there is always a possibility of  nerve damage, which could be as serious as a permanent spinal cord injury with  paralysis.       9. Death:  Although rare, severe deadly allergic reactions known as  "Anaphylactic  reaction" can occur to any of the medications used.      10. Worsening of the symptoms:  We can always make thing worse.  What are the chances of something like this happening? Chances of any of this occuring are extremely low.  By statistics, you have more of a chance of getting killed in a motor vehicle accident: while driving to the hospital than any of the above occurring .  Nevertheless, you should be aware that they are possibilities.  In general, it is similar to taking a shower.  Everybody knows that you can slip, hit your head and get killed.  Does that mean that you should not shower again?  Nevertheless always keep in mind that statistics do not mean anything if you happen to be on the wrong side of them.  Even if a procedure has a 1 (one) in a 1,000,000 (million) chance of going wrong, it you happen to be that one..Also, keep in mind that by statistics, you have more of a chance of having something go wrong when taking medications.  Who should not have this procedure? If you are on a blood thinning medication (e.g. Coumadin, Plavix, see list of "Blood Thinners"), or if you have an active infection going on, you should not have the procedure.  If you are taking any blood thinners, please inform your physician.  How should I prepare for this procedure?  Do not eat or drink anything at least six hours prior to the procedure.  Bring a driver with you .  It cannot be a taxi.  Come accompanied by an adult that can drive you back, and that is strong enough to help you if your legs get weak or numb from the local anesthetic.  Take all of your medicines the morning of the procedure with just enough water to swallow them.  If you have diabetes, make sure that you are scheduled to have your procedure done first thing in the morning, whenever possible.  If you have diabetes, take only half of your insulin dose and notify our nurse that you have done so as soon as you arrive at the  clinic.  If you are diabetic, but only take blood sugar pills (oral hypoglycemic), then do not take them on the morning of your procedure.  You may take them after you have had the procedure.  Do not take aspirin or any aspirin-containing medications, at least eleven (11) days prior to the procedure.  They may prolong bleeding.  Wear loose fitting clothing that may be easy to take off and that you would not mind if it got stained with Betadine or blood.  Do not wear any jewelry or perfume  Remove any nail coloring.  It will interfere with some of our monitoring equipment.  NOTE: Remember that this is not meant to be interpreted as a complete list of all possible complications.  Unforeseen problems may occur.  BLOOD THINNERS  The following drugs contain aspirin or other products, which can cause increased bleeding during surgery and should not be taken for 2 weeks prior to and 1 week after surgery.  If you should need take something for relief of minor pain, you may take acetaminophen which is found in Tylenol,m Datril, Anacin-3 and Panadol. It is not blood thinner. The products listed below are.  Do not take any of the products listed below in addition to any listed on your instruction sheet.  A.P.C or A.P.C with Codeine Codeine Phosphate Capsules #3 Ibuprofen Ridaura  ABC compound Congesprin Imuran rimadil  Advil Cope Indocin Robaxisal  Alka-Seltzer Effervescent Pain Reliever and Antacid Coricidin or Coricidin-D  Indomethacin Rufen  Alka-Seltzer plus Cold Medicine Cosprin Ketoprofen S-A-C Tablets  Anacin Analgesic Tablets or Capsules Coumadin Korlgesic Salflex  Anacin Extra Strength Analgesic tablets or capsules CP-2 Tablets Lanoril Salicylate  Anaprox Cuprimine Capsules Levenox Salocol  Anexsia-D Dalteparin Magan Salsalate  Anodynos Darvon compound Magnesium Salicylate Sine-off  Ansaid Dasin Capsules Magsal Sodium Salicylate  Anturane Depen Capsules Marnal Soma  APF Arthritis pain  formula Dewitt's Pills Measurin Stanback  Argesic Dia-Gesic Meclofenamic Sulfinpyrazone  Arthritis Bayer Timed Release Aspirin Diclofenac Meclomen Sulindac  Arthritis pain formula Anacin Dicumarol Medipren Supac  Analgesic (Safety coated) Arthralgen Diffunasal Mefanamic Suprofen  Arthritis Strength Bufferin Dihydrocodeine Mepro Compound Suprol  Arthropan liquid Dopirydamole Methcarbomol with Aspirin Synalgos  ASA tablets/Enseals Disalcid Micrainin Tagament  Ascriptin Doan's Midol Talwin  Ascriptin A/D Dolene Mobidin Tanderil  Ascriptin Extra Strength Dolobid Moblgesic Ticlid  Ascriptin with Codeine Doloprin or Doloprin with Codeine Momentum Tolectin  Asperbuf Duoprin Mono-gesic Trendar  Aspergum Duradyne Motrin or Motrin IB Triminicin  Aspirin plain, buffered or enteric coated Durasal Myochrisine Trigesic  Aspirin Suppositories Easprin Nalfon Trillsate  Aspirin with Codeine Ecotrin Regular or Extra Strength Naprosyn Uracel  Atromid-S Efficin Naproxen Ursinus  Auranofin Capsules Elmiron Neocylate Vanquish  Axotal Emagrin Norgesic Verin  Azathioprine Empirin or Empirin with Codeine Normiflo Vitamin E  Azolid Emprazil Nuprin Voltaren  Bayer Aspirin plain, buffered or children's or timed BC Tablets or powders Encaprin Orgaran Warfarin Sodium  Buff-a-Comp Enoxaparin Orudis Zorpin  Buff-a-Comp with Codeine Equegesic Os-Cal-Gesic   Buffaprin Excedrin plain, buffered or Extra Strength Oxalid   Bufferin Arthritis Strength Feldene Oxphenbutazone   Bufferin plain or Extra Strength Feldene Capsules Oxycodone with Aspirin   Bufferin with Codeine Fenoprofen Fenoprofen Pabalate or Pabalate-SF   Buffets II Flogesic Panagesic   Buffinol plain or Extra Strength Florinal or Florinal with Codeine Panwarfarin   Buf-Tabs Flurbiprofen Penicillamine   Butalbital Compound Four-way cold tablets Penicillin   Butazolidin Fragmin Pepto-Bismol   Carbenicillin Geminisyn Percodan   Carna Arthritis Reliever Geopen  Persantine   Carprofen Gold's salt Persistin   Chloramphenicol Goody's Phenylbutazone   Chloromycetin Haltrain Piroxlcam   Clmetidine heparin Plaquenil   Cllnoril Hyco-pap Ponstel   Clofibrate Hydroxy chloroquine Propoxyphen         Before stopping any of these medications, be sure to consult the physician who ordered them.  Some, such as Coumadin (Warfarin) are ordered to prevent or treat serious conditions such as "deep thrombosis", "pumonary embolisms", and other heart problems.  The amount of time that you may need off of the medication may also vary with the medication and the reason for which you were taking it.  If you are taking any of these medications, please make sure you notify your pain physician before you undergo any procedures.

## 2016-02-14 NOTE — Progress Notes (Signed)
Safety precautions to be maintained throughout the outpatient stay will include: orient to surroundings, keep bed in low position, maintain call bell within reach at all times, provide assistance with transfer out of bed and ambulation.  

## 2016-02-14 NOTE — Progress Notes (Signed)
Subjective:    Patient ID: Jill Robertson, female    DOB: [birth year], 55 y.o.   MRN: [id number]  HPI  The patient is a 55 year old female who returns to pain management for further evaluation and treatment of pain involving the upper mid lower back and lower extremity regions. The patient has had improvement of lower back and lower extremity pain with treatment in pain management Center. The patient states that her most bothersome pain involves the region of the right shoulder and the region of the upper and mid back region. The patient states that she is with history of fracture of ribs and that the pain involving the mid back region is quite severe at this time. We discussed patient's condition and will continue present medications consisting of Flexeril Lyrica and oxycodone. The patient wished to proceed with interventional treatment for pain involving the mid upper back regions and we will proceed with intercostal nerve blocks in attempt to decrease severe pain occurring in the thoracic region and patient with history of fracture of ribs. This appears to be significant component of pain due to severe spasms and intercostal neuralgia of the thoracic region. The patient is with pain involving the shoulder with limited range of motion of the shoulder with discussed additional studies of the shoulder to be obtained as well as interventional treatment for treatment of pain involving the shoulder. The patient was with understanding and in agreement with this treatment plan. We will proceed with intercostal nerve blocks to be performed at time of return appointment as discussed and as explained to patient on today's visit who was with understanding and in agreement with suggested treatment plan     Review of Systems     Objective:   Physical Exam  There was well-healed surgical scar of the cervical region with tenderness to palpation of the splenius capitis and occipitalis musculature region of  moderate degree with moderate tenderness over the region of the acromioclavicular and glenohumeral joint region especially on the right. There was mild difficulty performing drop test. Palpation over the thoracic region thoracic facet region was attends to palpation of moderate to moderately severe degree without definite crepitus of the thoracic region noted. Tinel and Phalen's maneuver were without increase of pain of significant degree. The patient appeared to be with slightly decreased grip strength. Palpation over the lumbar paraspinal must reason lumbar facet region was with mild to moderate discomfort with lateral bending rotation extension and palpation over the lumbar facets reproducing mild to moderate discomfort. There was moderate tenderness of the PSIS and PII S region as well as the gluteal and piriformis musculature region with mild tenderness of the greater trochanteric region and iliotibial band region. Straight leg raising was tolerates approximately 30 without a definite increased pain with dorsiflexion noted. There appeared to be negative clonus negative Homans. DTRs were difficult to elicit appeared to be trace at the knees. 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 L4-L5 disc bulging. L5-S1 disc space narrowing with endplate spurring and cervical region disc bulging with progression of findings since prior study of 2015  Sacroiliac joint dysfunction  Degenerative disc disease lumbar spine  Lumbar facet syndrome  Degenerative disc disease cervical spine Status post C4-C7 ACDF  Degenerative joint disease of shoulder    PLAN   Continue present medications Flexeril Lyrica and oxycodone  for now as discussed  NO NAPROXEN  NO IBUPROFEN  Intercostal nerve blocks to be performed at time of return appointment  F/U  PCP  S Amin  for evaliation of  BP and general medical  condition  F/U surgical evaluation. Follow-up with Dr. Sherley Bounds as discussed  F/U psych evaluation as discussed  F/U neurological evaluation. May consider pending follow-up evaluations  May consider radiofrequency rhizolysis or intraspinal procedures pending response to present treatment and F/U evaluation . We will request insurance approval for radiofrequency lumbar facets on the left side  Patient to call [facility] should patient have concerns prior to scheduled return appointment

## 2016-02-22 DIAGNOSIS — L570 Actinic keratosis: Secondary | ICD-10-CM | POA: Diagnosis not present

## 2016-02-29 ENCOUNTER — Ambulatory Visit: Payer: Medicare Other | Admitting: Pain Medicine

## 2016-03-01 DIAGNOSIS — F39 Unspecified mood [affective] disorder: Secondary | ICD-10-CM | POA: Diagnosis not present

## 2016-03-01 DIAGNOSIS — F4310 Post-traumatic stress disorder, unspecified: Secondary | ICD-10-CM | POA: Diagnosis not present

## 2016-03-07 ENCOUNTER — Ambulatory Visit: Payer: Medicare Other | Attending: Pain Medicine | Admitting: Pain Medicine

## 2016-03-07 ENCOUNTER — Encounter: Payer: Self-pay | Admitting: Pain Medicine

## 2016-03-07 VITALS — BP 115/74 | HR 75 | Temp 98.0°F | Resp 16 | Ht 68.0 in | Wt 218.0 lb

## 2016-03-07 DIAGNOSIS — R0782 Intercostal pain: Secondary | ICD-10-CM | POA: Diagnosis not present

## 2016-03-07 DIAGNOSIS — G588 Other specified mononeuropathies: Secondary | ICD-10-CM

## 2016-03-07 DIAGNOSIS — Z981 Arthrodesis status: Secondary | ICD-10-CM

## 2016-03-07 DIAGNOSIS — M19011 Primary osteoarthritis, right shoulder: Secondary | ICD-10-CM

## 2016-03-07 DIAGNOSIS — M47816 Spondylosis without myelopathy or radiculopathy, lumbar region: Secondary | ICD-10-CM

## 2016-03-07 DIAGNOSIS — M62830 Muscle spasm of back: Secondary | ICD-10-CM | POA: Insufficient documentation

## 2016-03-07 DIAGNOSIS — M546 Pain in thoracic spine: Secondary | ICD-10-CM | POA: Diagnosis not present

## 2016-03-07 DIAGNOSIS — M5416 Radiculopathy, lumbar region: Secondary | ICD-10-CM

## 2016-03-07 DIAGNOSIS — M5136 Other intervertebral disc degeneration, lumbar region: Secondary | ICD-10-CM

## 2016-03-07 DIAGNOSIS — M5030 Other cervical disc degeneration, unspecified cervical region: Secondary | ICD-10-CM

## 2016-03-07 DIAGNOSIS — M533 Sacrococcygeal disorders, not elsewhere classified: Secondary | ICD-10-CM

## 2016-03-07 DIAGNOSIS — M19012 Primary osteoarthritis, left shoulder: Secondary | ICD-10-CM

## 2016-03-07 MED ORDER — ORPHENADRINE CITRATE 30 MG/ML IJ SOLN: 60.0000 mg | Freq: Once | INTRAMUSCULAR | Status: DC

## 2016-03-07 MED ORDER — BUPIVACAINE HCL (PF) 0.25 % IJ SOLN
INTRAMUSCULAR | Status: AC
  Administered 2016-03-07: 12:00:00
  Filled 2016-03-07: qty 30

## 2016-03-07 MED ORDER — CEFUROXIME AXETIL 250 MG PO TABS: 250.0000 mg | ORAL_TABLET | Freq: Two times a day (BID) | ORAL | Status: DC

## 2016-03-07 MED ORDER — OXYCODONE HCL 10 MG PO TABS: ORAL_TABLET | ORAL | Status: DC

## 2016-03-07 MED ORDER — ORPHENADRINE CITRATE 30 MG/ML IJ SOLN
INTRAMUSCULAR | Status: AC
  Administered 2016-03-07: 12:00:00
  Filled 2016-03-07: qty 2

## 2016-03-07 MED ORDER — BUPIVACAINE HCL (PF) 0.25 % IJ SOLN: 30.0000 mL | Freq: Once | INTRAMUSCULAR | Status: DC

## 2016-03-07 MED ORDER — MIDAZOLAM HCL 5 MG/5ML IJ SOLN
INTRAMUSCULAR | Status: AC
  Administered 2016-03-07: 5 mg via INTRAVENOUS
  Filled 2016-03-07: qty 5

## 2016-03-07 MED ORDER — FENTANYL CITRATE (PF) 100 MCG/2ML IJ SOLN: 100.0000 ug | Freq: Once | INTRAMUSCULAR | Status: DC

## 2016-03-07 MED ORDER — TRIAMCINOLONE ACETONIDE 40 MG/ML IJ SUSP
INTRAMUSCULAR | Status: AC
  Administered 2016-03-07: 12:00:00
  Filled 2016-03-07: qty 1

## 2016-03-07 MED ORDER — LACTATED RINGERS IV SOLN
1000.0000 mL | INTRAVENOUS | Status: DC
Start: 2016-03-07 — End: 2023-01-21

## 2016-03-07 MED ORDER — TRIAMCINOLONE ACETONIDE 40 MG/ML IJ SUSP: 40.0000 mg | Freq: Once | INTRAMUSCULAR | Status: DC

## 2016-03-07 MED ORDER — MIDAZOLAM HCL 5 MG/5ML IJ SOLN: 5.0000 mg | Freq: Once | INTRAMUSCULAR | Status: DC

## 2016-03-07 MED ORDER — CYCLOBENZAPRINE HCL 10 MG PO TABS: ORAL_TABLET | ORAL | Status: DC

## 2016-03-07 MED ORDER — CEFAZOLIN SODIUM 1-5 GM-% IV SOLN: 1.0000 g | Freq: Once | INTRAVENOUS | Status: DC

## 2016-03-07 MED ORDER — CEFAZOLIN SODIUM 1 G IJ SOLR
INTRAMUSCULAR | Status: AC
  Administered 2016-03-07: 1 g via INTRAVENOUS
  Filled 2016-03-07: qty 10

## 2016-03-07 MED ORDER — FENTANYL CITRATE (PF) 100 MCG/2ML IJ SOLN
INTRAMUSCULAR | Status: AC
  Administered 2016-03-07: 100 ug via INTRAVENOUS
  Filled 2016-03-07: qty 2

## 2016-03-07 NOTE — Progress Notes (Signed)
Safety precautions to be maintained throughout the outpatient stay will include: orient to surroundings, keep bed in low position, maintain call bell within reach at all times, provide assistance with transfer out of bed and ambulation.  

## 2016-03-07 NOTE — Progress Notes (Signed)
   Subjective:    Patient ID: Jill Robertson, female    DOB: 08/19/1961, 55 y.o.   MRN: [id number]  HPI  PROCEDURE PERFORMED:  Intercostal nerve block.  HISTORY OF PRESENT ILLNESS:  The patient is a 55 y.o. female who returns to the [facility] for further evaluation and treatment of pain involving the midportion of the back.The patient is with significant degenerative disc disease of the cervical thoracic and lumbar regions. The patient is a severe pain of the thoracic region aggravated by reaching and lifting twisting turning maneuvers. Patient is with severe muscle spasms of the thoracic region There is concern regarding the patient's pain being due to significant component of intercostal neuralgia. The risks, benefits, and expectations of the procedure have been discussed and explained to the patient who was understanding and in agreement with suggested treatment plan. We will proceed with interventional treatment as discussed.   DESCRIPTION OF PROCEDURE: Intercostal nerve block with IV Versed, IV fentanyl conscious sedation, EKG, blood pressure, pulse, capnography, and pulse oximetry monitoring. The procedure was performed with the patient in the prone position under fluoroscopic guidance.   Intercostal nerve block, left side: With the patient in the prone position, Betadine prep of proposed entry site was performed under fluoroscopic guidance with AP view of the thoracic spine. Under fluoroscopic guidance, a 22 -gauge needle was inserted to contact bone of the 11th rib on the left side after which the needle was repositioned at the inferior border of the 11th rib on the left side under fluoroscopic guidance. Following documentation of needle placement, at the inferior border of the 11th rib on the left side and negative aspiration, a total of 3 mL of 0.25% bupivacaine with Kenalog was injected for left side for 11 rib intercostal nerve block.   INTERCOSTAL NERVE BLOCKS AT T10, T9, T8,  T7, and T6 LEVELS: The procedure was performed at these levels as was performed at the previous level, T 11, utilizing the same technique and under fluoroscopic guidance  Myoneural block injections of the thoracic region Following Betadine prep of proposed entry site a 22-gauge needle was inserted into the thoracic musculature region and following negative aspiration 1 cc of 0.25% bupivacaine with Norflex was injected for myoneural block injection of the thoracic region times  The patient tolerated procedure well  A total of 10 mg Kenalog was utilized for the procedure.   PLAN:   1. Medications: We will continue presently prescribed medications Lyrica Flexeril and oxycodone. 2. The patient is to follow up with primary care physician Dr. Reesa Chew  for further evaluation of blood pressure and general medical condition as discussed. 3. Surgical evaluation. Has been addressed  4. Neurological evaluation.May consider PNCV EMG studies and other studies  5. May consider the patient for additional studies pending response to treatment and follow-up evaluation. 6. May consider radiofrequency procedures, implantation type procedures and other treatment pending response to treatment and follow-up evaluation. 7. The patient has been advised to adhere to proper body mechanics and to call the [facility] Management Center prior to scheduled return appointment should there be significant change in condition or have other concerns regarding condition prior to scheduled return appointment.  The patient is understanding and in agreement with suggested treatment plan.    Review of Systems     Objective:   Physical Exam        Assessment & Plan:

## 2016-03-07 NOTE — Patient Instructions (Addendum)
PLAN   Continue present medications for now as discussed . Please obtain Ceftin antibiotic and begin taking Ceftin antibiotic today as prescribed  F/U PCP  [facility]  for evaliation of  BP and general medical  condition  F/U surgical evaluation. Follow-up with Dr. Sherley Bounds as discussed  F/U neurological evaluation. May consider PNCV/EMG studies and other studies pending follow-up evaluations  May consider radiofrequency rhizolysis or intraspinal procedures pending response to present treatment and F/U evaluation Pain ManagementIntercostal Nerve Block Patient Information  Description: The twelve intercostal nerves arise from the first thru twelfth thoracic nerve roots.  The nerve begins at the spine and wraps around the body, lying in a groove underneath each rib.  Each intercostal nerve innervates a specific strip of skin and body walk of the abdomen and chest.  Therefore, injuries of the chest wall or abdominal wall result in pain that is transmitted back to the brian via the intercostal nerves.  Examples of such injuries include rib fractures and incisions for lung and gall bladder surgery.  Occasionally, pain may persist long after an injury or surgical incision secondary to inflammation and irritation of the intercostal nerve.  The longstanding pain is known as intercostal neuralgia.  An intercostal nerve block is preformed to eliminate pain either temporarily or permanently.  A small needle is placed below the rib and local anesthetic (like Novocaine) and possibly steroid is injected.  Usually 2-4 intercostal nerves are blocked at a time depending on the problem.  The patient will experience a slight "pin-prick" sensation for each injection.  Shortly thereafter, the strip of skin that is innervated by the blocked intercostal nerve will feel numb.  Persistent pain that is only temporarily relieved with local anesthetic may require a more permanent block. This procedure is called Cryoneurolysis and  entails placing a small probe beneath the rib to freeze the nerve.  Conditions that may be treated by intercostal nerve blocks:   Rib fractures  Longstanding pain from surgery of the chest or abdomen (intercostal neuralgia)  Pain from chest tubes  Pain from trauma to the chest  Preparation for the injections:  1. Do not eat any solid food or dairy products within 8 hours of your appointment. 2. You may drink clear liquids up to 3 hours before appointment.  Clear liquids include water, black coffee, juice or soda.  No milk or cream please. 3. You may take your regular medication, including pain medications, with a sip of water before your appointment.  Diabetics should hold regular insulin (if take separately) and take 1/2 normal NPH dose the morning of the procedure.   Carry some sugar containing items with you to your appointment. 4. A driver must accompany you and be prepared to drive you home after your procedure. 5. Bring all your current medications with you. 6. An IV may be inserted and sedation may be given at the discretion of the physician. 7. A blood pressure cuff, EKG and other monitors will often be applied during the procedure.  Some patients may need to have extra oxygen administered for a short period. 8. You will be asked to provide medical information, including your allergies, prior to the procedure.  We must know immediately if you are taking blood thinners (like Coumadin/Warfarin) or if you are allergic to IV iodine contrast (dye). We must know if you could possible be pregnant.  Possible side-effects:   Bleeding from needle site  Infection (rare)  Nerve injury (rare)  Numbness & tingling of  skin  Collapsed lung requiring chest tube (rare)  Local anesthetic toxicity (rare)  Light-headedness (temporary)  Pain at injection site (several days)  Decreased blood pressure (temporary)  Shortness of breath  Jittery/shaking sensation (temporary)  Call if  you experience:   Difficulty breathing or hives (go directly to the emergency room)  Redness, inflammation or drainage at the injection site  Severe pain at the site of the injection  Any new symptoms which are concerning   Please note:  Your pain may subside immediately but may return several hours after the injection.  Often, more than one injection is required to reduce the pain. Also, if several temporary blocks with local anesthetic are ineffective, a more permanent block with cryolysis may be necessary.  This will be discussed with you should this be the case.  If you have any questions, please call [phone] [facility]    Pain Management Discharge Instructions  General Discharge Instructions :  If you need to reach your doctor call: Monday-Friday 8:00 am - 4:00 pm at [phone] or toll free [phone].  After clinic hours [phone] to have operator reach doctor.  Bring all of your medication bottles to all your appointments in the pain clinic.  To cancel or reschedule your appointment with Pain Management please remember to call 24 hours in advance to avoid a fee.  Refer to the educational materials which you have been given on: General Risks, I had my Procedure. Discharge Instructions, Post Sedation.  Post Procedure Instructions:  The drugs you were given will stay in your system until tomorrow, so for the next 24 hours you should not drive, make any legal decisions or drink any alcoholic beverages.  You may eat anything you prefer, but it is better to start with liquids then soups and crackers, and gradually work up to solid foods.  Please notify your doctor immediately if you have any unusual bleeding, trouble breathing or pain that is not related to your normal pain.  Depending on the type of procedure that was done, some parts of your body may feel week and/or numb.  This usually clears up by tonight or the next  day.  Walk with the use of an assistive device or accompanied by an adult for the 24 hours.  You may use ice on the affected area for the first 24 hours.  Put ice in a Ziploc bag and cover with a towel and place against area 15 minutes on 15 minutes off.  You may switch to heat after 24 hours.

## 2016-03-13 ENCOUNTER — Encounter: Payer: Self-pay | Admitting: Pain Medicine

## 2016-03-13 ENCOUNTER — Encounter: Payer: Medicare Other | Admitting: Pain Medicine

## 2016-03-14 ENCOUNTER — Encounter: Payer: Self-pay | Admitting: Pain Medicine

## 2016-03-19 DIAGNOSIS — L570 Actinic keratosis: Secondary | ICD-10-CM | POA: Diagnosis not present

## 2016-03-19 DIAGNOSIS — L814 Other melanin hyperpigmentation: Secondary | ICD-10-CM | POA: Diagnosis not present

## 2016-03-19 DIAGNOSIS — L578 Other skin changes due to chronic exposure to nonionizing radiation: Secondary | ICD-10-CM | POA: Diagnosis not present

## 2016-03-21 DIAGNOSIS — M70951 Unspecified soft tissue disorder related to use, overuse and pressure, right thigh: Secondary | ICD-10-CM | POA: Diagnosis not present

## 2016-04-09 ENCOUNTER — Encounter: Payer: Self-pay | Admitting: Pain Medicine

## 2016-04-09 ENCOUNTER — Ambulatory Visit: Payer: Medicare Other | Attending: Pain Medicine | Admitting: Pain Medicine

## 2016-04-09 VITALS — BP 118/81 | HR 87 | Temp 97.7°F | Resp 16 | Ht 68.0 in | Wt 217.0 lb

## 2016-04-09 DIAGNOSIS — Z8781 Personal history of (healed) traumatic fracture: Secondary | ICD-10-CM | POA: Diagnosis not present

## 2016-04-09 DIAGNOSIS — M5416 Radiculopathy, lumbar region: Secondary | ICD-10-CM | POA: Diagnosis not present

## 2016-04-09 DIAGNOSIS — M5030 Other cervical disc degeneration, unspecified cervical region: Secondary | ICD-10-CM

## 2016-04-09 DIAGNOSIS — M47816 Spondylosis without myelopathy or radiculopathy, lumbar region: Secondary | ICD-10-CM

## 2016-04-09 DIAGNOSIS — R0782 Intercostal pain: Secondary | ICD-10-CM | POA: Diagnosis not present

## 2016-04-09 DIAGNOSIS — M19012 Primary osteoarthritis, left shoulder: Secondary | ICD-10-CM

## 2016-04-09 DIAGNOSIS — G588 Other specified mononeuropathies: Secondary | ICD-10-CM | POA: Diagnosis not present

## 2016-04-09 DIAGNOSIS — M5136 Other intervertebral disc degeneration, lumbar region: Secondary | ICD-10-CM | POA: Diagnosis not present

## 2016-04-09 DIAGNOSIS — M19019 Primary osteoarthritis, unspecified shoulder: Secondary | ICD-10-CM | POA: Insufficient documentation

## 2016-04-09 DIAGNOSIS — M47817 Spondylosis without myelopathy or radiculopathy, lumbosacral region: Secondary | ICD-10-CM | POA: Diagnosis not present

## 2016-04-09 DIAGNOSIS — M19011 Primary osteoarthritis, right shoulder: Secondary | ICD-10-CM

## 2016-04-09 DIAGNOSIS — Z981 Arthrodesis status: Secondary | ICD-10-CM | POA: Diagnosis not present

## 2016-04-09 DIAGNOSIS — M5126 Other intervertebral disc displacement, lumbar region: Secondary | ICD-10-CM | POA: Insufficient documentation

## 2016-04-09 DIAGNOSIS — M545 Low back pain: Secondary | ICD-10-CM | POA: Diagnosis not present

## 2016-04-09 DIAGNOSIS — M533 Sacrococcygeal disorders, not elsewhere classified: Secondary | ICD-10-CM | POA: Diagnosis not present

## 2016-04-09 DIAGNOSIS — R51 Headache: Secondary | ICD-10-CM | POA: Diagnosis present

## 2016-04-09 MED ORDER — OXYCODONE HCL 10 MG PO TABS: ORAL_TABLET | ORAL | Status: DC

## 2016-04-09 MED ORDER — CYCLOBENZAPRINE HCL 10 MG PO TABS: ORAL_TABLET | ORAL | Status: DC

## 2016-04-09 NOTE — Patient Instructions (Addendum)
c 

## 2016-04-09 NOTE — Progress Notes (Signed)
   Subjective:    Patient ID: Jill Robertson, female    DOB: [birth year], 55 y.o.   MRN: [id number]  HPI  The patient is a 55 year old female who returns to pain management for further evaluation and treatment of pain involving the lower back and lower extremity region predominantly with pain occurring the neck and upper extremity regions of lesser degree. The patient stated that her right hip has had increased pain. The patient states the pain occurs in the region of the right lower extremity and anterior aspect of the right side. We discussed patient's condition including interventional treatment and May consider patient for femoral and operator nerve blocks. The patient will continue her present medications Flexeril Lyrica and oxycodone at this time. The patient was with understanding and agreed to suggested treatment plan  Review of Systems     Objective:   Physical Exam  There was tenderness of the splenius capitis and occipitalis regions palpation which reproduced moderate discomfort. There was moderate tenderness of the cervical facet cervical paraspinal musculature region. Palpation over the region of the thoracic facet thoracic paraspinal musculature region was with moderate tenderness to palpation without crepitus of the thoracic region is tenderness of the acromioclavicular and glenohumeral joint region and patient was with unremarkable Spurling's maneuver. Tinel and Phalen's maneuver reproduced pain of mild degree with grip strength appeared to be slightly decreased. Palpation over the PSIS and PII S regions reproduce moderate discomfort. There was moderate to moderately severe tenderness to palpation over the PSIS and PII S region on the right. Palpation of the greater trochanteric region iliotibial band region reproduced mild discomfort. Straight leg raising was decreased and tolerates approximately 20 without a definite increased pain with dorsiflexion noted. EHL strength appeared to be  slightly decreased. There was negative clonus negative Homans. 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 L4-L5 disc bulging. L5-S1 disc space narrowing with endplate spurring and cervical region disc bulging with progression of findings since prior study of 2015  Sacroiliac joint dysfunction  Degenerative disc disease lumbar spine  Lumbar facet syndrome  Degenerative disc disease cervical spine Status post C4-C7 ACDF  Degenerative joint disease of shoulder         PLAN   Continue present medications Flexeril Lyrica and oxycodone for now as discussed NO NAPROXEN NO IBUPROFEN  May consider femoral and obturator nerve blocks as discussed for pain involving the right hip  F/U PCP [facility] for evaliation of BP and general medical condition  F/U surgical evaluation. Follow-up with Dr. Sherley Bounds as discussed  F/U psych evaluation as discussed  F/U neurological evaluation. May consider pending follow-up evaluations  May consider radiofrequency rhizolysis or intraspinal procedures pending response to present treatment and F/U evaluation . We will request insurance approval for radiofrequency lumbar facets on the left side as previously discussed  Patient to call Pain Management Center should patient have concerns prior to scheduled return appointment

## 2016-04-09 NOTE — Progress Notes (Signed)
Patient here for medication management of her oxycodone hcl 10 mg and Flexeril 10 mg. Safety precautions to be maintained throughout the outpatient stay will include: orient to surroundings, keep bed in low position, maintain call bell within reach at all times, provide assistance with transfer out of bed and ambulation.

## 2016-04-12 ENCOUNTER — Encounter: Payer: Medicare Other | Admitting: Pain Medicine

## 2016-04-19 ENCOUNTER — Other Ambulatory Visit: Payer: Self-pay | Admitting: Pain Medicine

## 2016-04-19 LAB — TOXASSURE SELECT 13 (MW), URINE: PDF: 0

## 2016-04-24 DIAGNOSIS — G894 Chronic pain syndrome: Secondary | ICD-10-CM | POA: Diagnosis not present

## 2016-04-24 DIAGNOSIS — I10 Essential (primary) hypertension: Secondary | ICD-10-CM | POA: Diagnosis not present

## 2016-04-24 DIAGNOSIS — Z0000 Encounter for general adult medical examination without abnormal findings: Secondary | ICD-10-CM | POA: Diagnosis not present

## 2016-04-24 DIAGNOSIS — E785 Hyperlipidemia, unspecified: Secondary | ICD-10-CM | POA: Diagnosis not present

## 2016-05-08 ENCOUNTER — Ambulatory Visit: Payer: Medicare Other | Attending: Pain Medicine | Admitting: Pain Medicine

## 2016-05-08 ENCOUNTER — Encounter: Payer: Self-pay | Admitting: Pain Medicine

## 2016-05-08 VITALS — BP 128/85 | HR 98 | Temp 98.1°F | Resp 18 | Ht 68.0 in | Wt 217.0 lb

## 2016-05-08 DIAGNOSIS — M51369 Other intervertebral disc degeneration, lumbar region without mention of lumbar back pain or lower extremity pain: Secondary | ICD-10-CM

## 2016-05-08 DIAGNOSIS — M5126 Other intervertebral disc displacement, lumbar region: Secondary | ICD-10-CM | POA: Insufficient documentation

## 2016-05-08 DIAGNOSIS — M47817 Spondylosis without myelopathy or radiculopathy, lumbosacral region: Secondary | ICD-10-CM | POA: Diagnosis not present

## 2016-05-08 DIAGNOSIS — M47816 Spondylosis without myelopathy or radiculopathy, lumbar region: Secondary | ICD-10-CM

## 2016-05-08 DIAGNOSIS — Z8781 Personal history of (healed) traumatic fracture: Secondary | ICD-10-CM | POA: Insufficient documentation

## 2016-05-08 DIAGNOSIS — Z981 Arthrodesis status: Secondary | ICD-10-CM

## 2016-05-08 DIAGNOSIS — M546 Pain in thoracic spine: Secondary | ICD-10-CM | POA: Diagnosis present

## 2016-05-08 DIAGNOSIS — Z96641 Presence of right artificial hip joint: Secondary | ICD-10-CM | POA: Diagnosis not present

## 2016-05-08 DIAGNOSIS — M5416 Radiculopathy, lumbar region: Secondary | ICD-10-CM

## 2016-05-08 DIAGNOSIS — M533 Sacrococcygeal disorders, not elsewhere classified: Secondary | ICD-10-CM | POA: Diagnosis not present

## 2016-05-08 DIAGNOSIS — M5020 Other cervical disc displacement, unspecified cervical region: Secondary | ICD-10-CM | POA: Diagnosis not present

## 2016-05-08 DIAGNOSIS — M19012 Primary osteoarthritis, left shoulder: Secondary | ICD-10-CM

## 2016-05-08 DIAGNOSIS — M79606 Pain in leg, unspecified: Secondary | ICD-10-CM | POA: Diagnosis present

## 2016-05-08 DIAGNOSIS — R0782 Intercostal pain: Secondary | ICD-10-CM | POA: Diagnosis not present

## 2016-05-08 DIAGNOSIS — G588 Other specified mononeuropathies: Secondary | ICD-10-CM

## 2016-05-08 DIAGNOSIS — M19011 Primary osteoarthritis, right shoulder: Secondary | ICD-10-CM

## 2016-05-08 DIAGNOSIS — M5136 Other intervertebral disc degeneration, lumbar region: Secondary | ICD-10-CM | POA: Diagnosis not present

## 2016-05-08 DIAGNOSIS — M19019 Primary osteoarthritis, unspecified shoulder: Secondary | ICD-10-CM | POA: Insufficient documentation

## 2016-05-08 DIAGNOSIS — M5030 Other cervical disc degeneration, unspecified cervical region: Secondary | ICD-10-CM

## 2016-05-08 DIAGNOSIS — M542 Cervicalgia: Secondary | ICD-10-CM | POA: Diagnosis present

## 2016-05-08 MED ORDER — CYCLOBENZAPRINE HCL 10 MG PO TABS: ORAL_TABLET | ORAL | Status: DC

## 2016-05-08 MED ORDER — OXYCODONE HCL 10 MG PO TABS: ORAL_TABLET | ORAL | Status: DC

## 2016-05-08 NOTE — Progress Notes (Signed)
Safety precautions to be maintained throughout the outpatient stay will include: orient to surroundings, keep bed in low position, maintain call bell within reach at all times, provide assistance with transfer out of bed and ambulation.  

## 2016-05-08 NOTE — Progress Notes (Signed)
   Subjective:    Patient ID: Jill Robertson, female    DOB: [birth year], 54 y.o.   MRN: [id number]  HPI  The patient is a 55 year old female who returns to pain management for further evaluation and treatment of pain involving the neck entire back upper and lower extremity regions. The patient is with some pain of the lower back lower extremity region with some swelling of the right lower extremity. The patient is status post right total hip replacement and has been concern regarding patient swelling due to the prior surgery of the hip. At the present time we will avoid interventional treatment and patient will continue Flexeril Lyrica and oxycodone. We discussed patient's condition and may consider patient for interventional treatment of lumbar lower extremity pain with there appeared to be a significant component of pain due to the sacroiliac joint. We will remain available to consider modification of treatment pending follow-up evaluation. All agreed to suggested treatment plan.  Review of Systems     Objective:   Physical Exam  There was tenderness of the splenius capitis and occipitalis region a mild to moderate degree with mild to moderate tenderness of the acromioclavicular and glenohumeral joint region. The patient was with bilaterally equal grip strength with Tinel and Phalen's maneuver reproducing minimal discomfort. There was tenderness over the region of the thoracic region in the lower region of the thoracic area with moderate tenderness to palpation of muscle spasms of the lower thoracic region noted. Palpation over the PSIS and PII S region reproduced moderate to moderately severe discomfort with tenderness of the greater trochanteric region iliotibial band region reproducing mild discomfort. The right lower extremity was slightly larger than the left lower extremity with no definite erythema or increased warmth of the right lower extremity compared to the left lower extremity. EHL  strength appeared to be slightly decreased gripand sensory deficit or dermatomal dystrophy detected. There was negative clonus negative Homans. 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 L4-L5 disc bulging. L5-S1 disc space narrowing with endplate spurring and cervical region disc bulging with progression of findings since prior study of 2015  Sacroiliac joint dysfunction  Degenerative disc disease lumbar spine  Lumbar facet syndrome  Degenerative disc disease cervical spine Status post C4-C7 ACDF  Degenerative joint disease of shoulder     PLAN   Continue present medications Flexeril Lyrica and oxycodone for now as discussed  NO NAPROXEN  NO IBUPROFEN  F/U PCP  [facility]  for evaliation of  BP and general medical  condition  F/U surgical evaluation. Follow-up with Dr. Sherley Bounds as discussed  F/U psych evaluation as discussed  F/U neurological evaluation. May consider pending follow-up evaluations  May consider radiofrequency rhizolysis or intraspinal procedures pending response to present treatment and F/U evaluation . We will request insurance approval for radiofrequency lumbar facets on the left side  Patient to call Pain Management Center should patient have concerns prior to scheduled return appointment.

## 2016-05-08 NOTE — Patient Instructions (Signed)
PLAN   Continue present medications Flexeril Lyrica and oxycodone for now as discussed  NO NAPROXEN  NO IBUPROFEN  F/U PCP  [facility]  for evaliation of  BP and general medical  condition  F/U surgical evaluation. Follow-up with Dr. Sherley Bounds as discussed  F/U psych evaluation as discussed  F/U neurological evaluation. May consider pending follow-up evaluations  May consider radiofrequency rhizolysis or intraspinal procedures pending response to present treatment and F/U evaluation . We will request insurance approval for radiofrequency lumbar facets on the left side  Patient to call Pain Management Center should patient have concerns prior to scheduled return appointment.

## 2016-05-09 DIAGNOSIS — Z1231 Encounter for screening mammogram for malignant neoplasm of breast: Secondary | ICD-10-CM | POA: Diagnosis not present

## 2016-05-21 DIAGNOSIS — H5213 Myopia, bilateral: Secondary | ICD-10-CM | POA: Diagnosis not present

## 2016-06-07 ENCOUNTER — Encounter: Payer: Self-pay | Admitting: Pain Medicine

## 2016-06-07 ENCOUNTER — Ambulatory Visit: Payer: Medicare Other | Attending: Pain Medicine | Admitting: Pain Medicine

## 2016-06-07 VITALS — BP 136/88 | HR 35 | Temp 98.0°F | Resp 16 | Ht 68.0 in | Wt 217.0 lb

## 2016-06-07 DIAGNOSIS — M5030 Other cervical disc degeneration, unspecified cervical region: Secondary | ICD-10-CM | POA: Diagnosis not present

## 2016-06-07 DIAGNOSIS — M170 Bilateral primary osteoarthritis of knee: Secondary | ICD-10-CM | POA: Diagnosis not present

## 2016-06-07 DIAGNOSIS — Z9889 Other specified postprocedural states: Secondary | ICD-10-CM

## 2016-06-07 DIAGNOSIS — M5126 Other intervertebral disc displacement, lumbar region: Secondary | ICD-10-CM | POA: Diagnosis not present

## 2016-06-07 DIAGNOSIS — M62830 Muscle spasm of back: Secondary | ICD-10-CM | POA: Insufficient documentation

## 2016-06-07 DIAGNOSIS — M19019 Primary osteoarthritis, unspecified shoulder: Secondary | ICD-10-CM | POA: Insufficient documentation

## 2016-06-07 DIAGNOSIS — Z981 Arthrodesis status: Secondary | ICD-10-CM | POA: Insufficient documentation

## 2016-06-07 DIAGNOSIS — M533 Sacrococcygeal disorders, not elsewhere classified: Secondary | ICD-10-CM | POA: Diagnosis not present

## 2016-06-07 DIAGNOSIS — M5416 Radiculopathy, lumbar region: Secondary | ICD-10-CM | POA: Diagnosis not present

## 2016-06-07 DIAGNOSIS — R0782 Intercostal pain: Secondary | ICD-10-CM | POA: Diagnosis not present

## 2016-06-07 DIAGNOSIS — Z8781 Personal history of (healed) traumatic fracture: Secondary | ICD-10-CM | POA: Diagnosis not present

## 2016-06-07 DIAGNOSIS — G588 Other specified mononeuropathies: Secondary | ICD-10-CM | POA: Insufficient documentation

## 2016-06-07 DIAGNOSIS — M5136 Other intervertebral disc degeneration, lumbar region: Secondary | ICD-10-CM | POA: Insufficient documentation

## 2016-06-07 DIAGNOSIS — M545 Low back pain: Secondary | ICD-10-CM | POA: Diagnosis present

## 2016-06-07 DIAGNOSIS — M79606 Pain in leg, unspecified: Secondary | ICD-10-CM | POA: Diagnosis present

## 2016-06-07 DIAGNOSIS — M47816 Spondylosis without myelopathy or radiculopathy, lumbar region: Secondary | ICD-10-CM

## 2016-06-07 DIAGNOSIS — M47817 Spondylosis without myelopathy or radiculopathy, lumbosacral region: Secondary | ICD-10-CM | POA: Diagnosis not present

## 2016-06-07 MED ORDER — CYCLOBENZAPRINE HCL 10 MG PO TABS: ORAL_TABLET | ORAL | 0 refills | Status: DC

## 2016-06-07 MED ORDER — OXYCODONE HCL 10 MG PO TABS: ORAL_TABLET | ORAL | 0 refills | Status: DC

## 2016-06-07 NOTE — Progress Notes (Signed)
The patient is a 55 year old female who returns to pain management for further evaluation and treatment of pain involving the region of the lower back and lower extremity region predominantly. The patient states the pain of the lower back regions aggravated by standing walking twisting turning maneuvers. The patient also admits to significant pain involving the region of the left knee which patient states is extremely bothersome. The patient also has difficulty obtaining restful sleep due to severe pain of the left knee. We discussed patient's condition and will proceed with genicular nerve blocks of the left knee at time return appointment in attempt to decrease severity of symptoms of the knee as well as improved the pain involving the lower back and lower extremity region. The patient will continue present medications consisting of Lyrica Flexeril and oxycodone and we will proceed with genicular nerve blocks at time return appointment. All agreed to suggested treatment plan. The patient will undergo further neurosurgical evaluation as well as discussed     Physical examination   There was tenderness to palpation of the paraspinal muscular treat the cervical and cervical facet region a mild degree with mild tenderness of the splenius capitis and occipitalis regions. Palpation of the acromioclavicular and glenohumeral joint regions reproduced pain of moderate degree and patient appeared to be unremarkable Spurling's maneuver. The patient was with bilaterally equal grip strength without significant increase of pain with Tinel and Phalen's maneuver. Palpation of the thoracic region was with evidence of muscle spasm of moderate degree without crepitus of the thoracic region noted. Palpation of the lumbar region reproduced moderate to moderately severe discomfort with palpation over the PSIS and PII S region reproduced severe pain on the left compared to the right there was tenderness over the greater  trochanteric region iliotibial band region a mild degree. Straight leg raise was limited to approximately 20 without a definite increased pain with dorsiflexion noted. Lateral bending rotation extension and palpation of the lumbar facets reproduced moderately severe discomfort as well. No sensory deficit of dermatomal distribution was detected. Knees were attends to palpation especially the left knee with crepitus of the knees with negative anterior and posterior drawer signs without ballottement of the patella. There was negative clonus negative Homans. 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 L4-L5 disc bulging. L5-S1 disc space narrowing with endplate spurring and cervical region disc bulging with progression of findings since prior study of 2015  Sacroiliac joint dysfunction  Degenerative disc disease lumbar spine  Lumbar facet syndrome  Degenerative disc disease cervical spine Status post C4-C7 ACDF  Degenerative joint disease of shoulder       PLAN    Continue present medications Flexeril Lyrica and oxycodone  NO NAPROXEN  NO IBUPROFEN  Genicular nerve blocks of the left knee to be performed at time return appointment  F/U PCP  [facility]  for evaliation of  BP and general medical  condition  F/U surgical evaluation. Follow-up with Dr. Sherley Bounds as discussed. Please ask the nurses the date of your surgical evaluation   F/U psych evaluation as discussed  F/U neurological evaluation. May consider pending follow-up evaluations  May consider radiofrequency rhizolysis or intraspinal procedures pending response to present treatment and F/U evaluation . We will request insurance approval for radiofrequency lumbar facets on the left side  Patient to call Pain Management Center  should patient have concerns prior to scheduled return appointment.

## 2016-06-07 NOTE — Progress Notes (Signed)
Safety precautions to be maintained throughout the outpatient stay will include: orient to surroundings, keep bed in low position, maintain call bell within reach at all times, provide assistance with transfer out of bed and ambulation.  

## 2016-06-07 NOTE — Patient Instructions (Addendum)
PLAN    Continue present medications Flexeril Lyrica and oxycodone  NO NAPROXEN  NO IBUPROFEN  Genicular nerve blocks of the left knee to be performed at time return appointment  F/U PCP  S Amin  for evaliation of  BP and general medical  condition  F/U surgical evaluation. Follow-up with Dr. Sherley Bounds as discussed. Please ask the nurses the date of your surgical evaluation   F/U psych evaluation as discussed  F/U neurological evaluation. May consider pending follow-up evaluations  May consider radiofrequency rhizolysis or intraspinal procedures pending response to present treatment and F/U evaluation . We will request insurance approval for radiofrequency lumbar facets on the left side  Patient to call [facility] should patient have concerns prior to scheduled return appointment.Trigger Finger Trigger finger (digital tendinitis and stenosing tenosynovitis) is a common disorder that causes an often painful catching of the fingers or thumb. It occurs as a clicking, snapping, or locking of a finger in the palm of the hand. This is caused by a problem with the tendons that flex or bend the fingers sliding smoothly through their sheaths. The condition may occur in any finger or a couple fingers at the same time.  The finger may lock with the finger curled or suddenly straighten out with a snap. This is more common in patients with rheumatoid arthritis and diabetes. Left untreated, the condition may get worse to the point where the finger becomes locked in flexion, like making a fist, or less commonly locked with the finger straightened out. CAUSES   Inflammation and scarring that lead to swelling around the tendon sheath.  Repeated or forceful movements.  Rheumatoid arthritis, an autoimmune disease that affects joints.  Gout.  Diabetes mellitus. SIGNS AND SYMPTOMS  Soreness and swelling of your finger.  A painful clicking or snapping as you bend and straighten your  finger. DIAGNOSIS  Your health care provider will do a physical exam of your finger to diagnose trigger finger. TREATMENT   Splinting for 6-8 weeks may be helpful.  Nonsteroidal anti-inflammatory medicines (NSAIDs) can help to relieve the pain and inflammation.  Cortisone injections, along with splinting, may speed up recovery. Several injections may be required. Cortisone may give relief after one injection.  Surgery is another treatment that may be used if conservative treatments do not work. Surgery can be minor, without incisions (a cut does not have to be made), and can be done with a needle through the skin.  Other surgical choices involve an open procedure in which the surgeon opens the hand through a small incision and cuts the pulley so the tendon can again slide smoothly. Your hand will still work fine. HOME CARE INSTRUCTIONS  Apply ice to the injured area, twice per day:  Put ice in a plastic bag.  Place a towel between your skin and the bag.  Leave the ice on for 20 minutes, 3-4 times a day.  Rest your hand often. MAKE SURE YOU:   Understand these instructions.  Will watch your condition.  Will get help right away if you are not doing well or get worse.   This information is not intended to replace advice given to you by your health care provider. Make sure you discuss any questions you have with your health care provider.   Document Released: 08/04/2004 Document Revised: 06/17/2013 Document Reviewed: 03/17/2013 Elsevier Interactive Patient Education 2016 [employer].  Oxycodone script given to patient.

## 2016-06-09 DIAGNOSIS — R509 Fever, unspecified: Secondary | ICD-10-CM | POA: Diagnosis not present

## 2016-06-09 DIAGNOSIS — J069 Acute upper respiratory infection, unspecified: Secondary | ICD-10-CM | POA: Diagnosis not present

## 2016-06-09 DIAGNOSIS — J4521 Mild intermittent asthma with (acute) exacerbation: Secondary | ICD-10-CM | POA: Diagnosis not present

## 2016-06-09 DIAGNOSIS — J209 Acute bronchitis, unspecified: Secondary | ICD-10-CM | POA: Diagnosis not present

## 2016-06-19 DIAGNOSIS — Z1211 Encounter for screening for malignant neoplasm of colon: Secondary | ICD-10-CM | POA: Diagnosis not present

## 2016-06-20 ENCOUNTER — Ambulatory Visit: Payer: Medicare Other | Admitting: Pain Medicine

## 2016-06-23 DIAGNOSIS — M1990 Unspecified osteoarthritis, unspecified site: Secondary | ICD-10-CM | POA: Diagnosis not present

## 2016-06-23 DIAGNOSIS — T402X5A Adverse effect of other opioids, initial encounter: Secondary | ICD-10-CM | POA: Diagnosis not present

## 2016-06-23 DIAGNOSIS — Z9049 Acquired absence of other specified parts of digestive tract: Secondary | ICD-10-CM | POA: Diagnosis not present

## 2016-06-23 DIAGNOSIS — E785 Hyperlipidemia, unspecified: Secondary | ICD-10-CM | POA: Diagnosis not present

## 2016-06-23 DIAGNOSIS — K859 Acute pancreatitis without necrosis or infection, unspecified: Secondary | ICD-10-CM | POA: Diagnosis not present

## 2016-06-23 DIAGNOSIS — R1011 Right upper quadrant pain: Secondary | ICD-10-CM | POA: Diagnosis not present

## 2016-06-23 DIAGNOSIS — Z885 Allergy status to narcotic agent status: Secondary | ICD-10-CM | POA: Diagnosis not present

## 2016-06-23 DIAGNOSIS — J449 Chronic obstructive pulmonary disease, unspecified: Secondary | ICD-10-CM | POA: Diagnosis not present

## 2016-06-23 DIAGNOSIS — E039 Hypothyroidism, unspecified: Secondary | ICD-10-CM | POA: Diagnosis not present

## 2016-06-23 DIAGNOSIS — Z888 Allergy status to other drugs, medicaments and biological substances status: Secondary | ICD-10-CM | POA: Diagnosis not present

## 2016-06-23 DIAGNOSIS — Z79899 Other long term (current) drug therapy: Secondary | ICD-10-CM | POA: Diagnosis not present

## 2016-06-23 DIAGNOSIS — G894 Chronic pain syndrome: Secondary | ICD-10-CM | POA: Diagnosis not present

## 2016-06-23 DIAGNOSIS — K5903 Drug induced constipation: Secondary | ICD-10-CM | POA: Diagnosis not present

## 2016-06-23 DIAGNOSIS — K219 Gastro-esophageal reflux disease without esophagitis: Secondary | ICD-10-CM | POA: Diagnosis not present

## 2016-06-23 DIAGNOSIS — I10 Essential (primary) hypertension: Secondary | ICD-10-CM | POA: Diagnosis not present

## 2016-06-23 DIAGNOSIS — Z87891 Personal history of nicotine dependence: Secondary | ICD-10-CM | POA: Diagnosis not present

## 2016-06-23 DIAGNOSIS — M549 Dorsalgia, unspecified: Secondary | ICD-10-CM | POA: Diagnosis not present

## 2016-06-29 DIAGNOSIS — K859 Acute pancreatitis without necrosis or infection, unspecified: Secondary | ICD-10-CM

## 2016-06-29 HISTORY — DX: Acute pancreatitis without necrosis or infection, unspecified: K85.90

## 2016-07-04 ENCOUNTER — Ambulatory Visit: Payer: Medicare Other | Admitting: Pain Medicine

## 2016-07-09 ENCOUNTER — Ambulatory Visit: Payer: Medicare Other | Attending: Pain Medicine | Admitting: Pain Medicine

## 2016-07-09 ENCOUNTER — Encounter: Payer: Self-pay | Admitting: Pain Medicine

## 2016-07-09 VITALS — BP 144/99 | HR 105 | Temp 98.9°F | Resp 16 | Ht 68.0 in | Wt 218.0 lb

## 2016-07-09 DIAGNOSIS — M51369 Other intervertebral disc degeneration, lumbar region without mention of lumbar back pain or lower extremity pain: Secondary | ICD-10-CM

## 2016-07-09 DIAGNOSIS — M170 Bilateral primary osteoarthritis of knee: Secondary | ICD-10-CM | POA: Insufficient documentation

## 2016-07-09 DIAGNOSIS — M533 Sacrococcygeal disorders, not elsewhere classified: Secondary | ICD-10-CM | POA: Diagnosis not present

## 2016-07-09 DIAGNOSIS — M79606 Pain in leg, unspecified: Secondary | ICD-10-CM | POA: Diagnosis present

## 2016-07-09 DIAGNOSIS — M545 Low back pain: Secondary | ICD-10-CM | POA: Diagnosis present

## 2016-07-09 DIAGNOSIS — Z981 Arthrodesis status: Secondary | ICD-10-CM | POA: Diagnosis not present

## 2016-07-09 DIAGNOSIS — R1013 Epigastric pain: Secondary | ICD-10-CM | POA: Diagnosis not present

## 2016-07-09 DIAGNOSIS — M5136 Other intervertebral disc degeneration, lumbar region: Secondary | ICD-10-CM | POA: Insufficient documentation

## 2016-07-09 DIAGNOSIS — R0782 Intercostal pain: Secondary | ICD-10-CM | POA: Diagnosis not present

## 2016-07-09 DIAGNOSIS — M5030 Other cervical disc degeneration, unspecified cervical region: Secondary | ICD-10-CM | POA: Diagnosis not present

## 2016-07-09 DIAGNOSIS — M47817 Spondylosis without myelopathy or radiculopathy, lumbosacral region: Secondary | ICD-10-CM | POA: Diagnosis not present

## 2016-07-09 DIAGNOSIS — G588 Other specified mononeuropathies: Secondary | ICD-10-CM | POA: Insufficient documentation

## 2016-07-09 DIAGNOSIS — Z9889 Other specified postprocedural states: Secondary | ICD-10-CM

## 2016-07-09 DIAGNOSIS — M19019 Primary osteoarthritis, unspecified shoulder: Secondary | ICD-10-CM | POA: Diagnosis not present

## 2016-07-09 DIAGNOSIS — Z8781 Personal history of (healed) traumatic fracture: Secondary | ICD-10-CM | POA: Diagnosis not present

## 2016-07-09 DIAGNOSIS — M47816 Spondylosis without myelopathy or radiculopathy, lumbar region: Secondary | ICD-10-CM

## 2016-07-09 DIAGNOSIS — M5416 Radiculopathy, lumbar region: Secondary | ICD-10-CM | POA: Diagnosis not present

## 2016-07-09 DIAGNOSIS — K859 Acute pancreatitis without necrosis or infection, unspecified: Secondary | ICD-10-CM | POA: Diagnosis not present

## 2016-07-09 MED ORDER — OXYCODONE HCL 10 MG PO TABS: ORAL_TABLET | ORAL | 0 refills | Status: AC

## 2016-07-09 MED ORDER — CYCLOBENZAPRINE HCL 10 MG PO TABS: ORAL_TABLET | ORAL | 0 refills | Status: AC

## 2016-07-09 NOTE — Progress Notes (Signed)
Patient here for medication management.  Patient is s/p hospitalization for pancreatitis.  Safety precautions to be maintained throughout the outpatient stay will include: orient to surroundings, keep bed in low position, maintain call bell within reach at all times, provide assistance with transfer out of bed and ambulation.

## 2016-07-09 NOTE — Patient Instructions (Signed)
PLAN    Continue present medications Flexeril Lyrica and oxycodone  NO NAPROXEN  NO IBUPROFEN  F/U PCP  S Amin  for evaliation of  BP and general medical  condition  F/U surgical evaluation. Follow-up with Dr. Sherley Bounds as discussed. Please ask the nurses the date of your surgical evaluation   F/U psych evaluation as discussed  F/U neurological evaluation. May consider pending follow-up evaluations  May consider radiofrequency rhizolysis or intraspinal procedures pending response to present treatment and F/U evaluation . We will request insurance approval for radiofrequency lumbar facets on the left side  Patient to call [facility] should patient have concerns prior to scheduled return appointment.

## 2016-07-10 DIAGNOSIS — G894 Chronic pain syndrome: Secondary | ICD-10-CM | POA: Diagnosis not present

## 2016-07-10 DIAGNOSIS — E785 Hyperlipidemia, unspecified: Secondary | ICD-10-CM | POA: Diagnosis not present

## 2016-07-10 DIAGNOSIS — Z23 Encounter for immunization: Secondary | ICD-10-CM | POA: Diagnosis not present

## 2016-07-10 DIAGNOSIS — I10 Essential (primary) hypertension: Secondary | ICD-10-CM | POA: Diagnosis not present

## 2016-07-10 DIAGNOSIS — E039 Hypothyroidism, unspecified: Secondary | ICD-10-CM | POA: Diagnosis not present

## 2016-07-11 NOTE — Progress Notes (Signed)
    The patient is a 55 year old female who returns to pain management for further evaluation and treatment of pain involving the lower back lower extremity region predominantly with pain involving the neck and headaches of lesser degree. The patient is with recent bout of pancreatitis. We will avoid modification of treatment regimen at this time and will avoid interventional treatment. The patient will follow-up with Dr.Amin and gastroenterologist for further evaluation and treatment of her condition. All were in agreement with suggested treatment plan.    Physical examination  There was tenderness of the splenius capitis and occipitalis region a mild degree with mild tenderness of the cervical and thoracic facet region. Palpation of the acromioclavicular and glenohumeral joint regions reproduces minimal discomfort and patient was with bilaterally equal grip strength. There was tends to palpation over the region of the thoracic region a mild degree with mild tenderness of the trapezius levator scapula and rhomboid musculature regions. Palpation over the lumbar paraspinal must reason lumbar facet region was with increased pain of moderate degree with moderate to moderately severe tenderness of the PSIS and PII S region. Straight leg raising was tolerates approximately 20 without increased pain with dorsiflexion noted. DTRs trace at the knees. There was negative clonus negative Homans. Abdomen was with some mid epigastric tenderness to palpation without fluid wave or shifting dullness. There was no costovertebral tenderness noted.    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 L4-L5 disc bulging. L5-S1 disc space narrowing with endplate spurring and cervical region disc bulging with progression of findings since prior study of 2015  Sacroiliac joint  dysfunction  Degenerative disc disease lumbar spine  Lumbar facet syndrome  Degenerative disc disease cervical spine Status post C4-C7 ACDF  Degenerative joint disease of shoulder      PLAN    Continue present medications Flexeril Lyrica and oxycodone  NO NAPROXEN  NO IBUPROFEN  F/U PCP  S Amin  for evaluation of  BP pancreatitis  and general medical  condition  F/U surgical evaluation. Follow-up with Dr. Sherley Bounds as discussed. Please ask the nurses the date of your surgical evaluation   GI evaluation of pancreatitis as discussed  F/U psych evaluation as discussed  F/U neurological evaluation. May consider pending follow-up evaluations  May consider radiofrequency rhizolysis or intraspinal procedures pending response to present treatment and F/U evaluation . We will request insurance approval for radiofrequency lumbar facets on the left side  Patient to call [facility] should patient have concerns prior to scheduled return appointment.

## 2016-08-02 DIAGNOSIS — M533 Sacrococcygeal disorders, not elsewhere classified: Secondary | ICD-10-CM | POA: Diagnosis not present

## 2016-08-02 DIAGNOSIS — R0782 Intercostal pain: Secondary | ICD-10-CM | POA: Diagnosis not present

## 2016-08-02 DIAGNOSIS — M47817 Spondylosis without myelopathy or radiculopathy, lumbosacral region: Secondary | ICD-10-CM | POA: Diagnosis not present

## 2016-08-02 DIAGNOSIS — M5416 Radiculopathy, lumbar region: Secondary | ICD-10-CM | POA: Diagnosis not present

## 2016-08-25 ENCOUNTER — Other Ambulatory Visit: Payer: Self-pay | Admitting: Pain Medicine

## 2016-09-11 ENCOUNTER — Other Ambulatory Visit: Payer: Self-pay | Admitting: Pain Medicine

## 2016-09-24 DIAGNOSIS — M5416 Radiculopathy, lumbar region: Secondary | ICD-10-CM | POA: Diagnosis not present

## 2016-09-24 DIAGNOSIS — M533 Sacrococcygeal disorders, not elsewhere classified: Secondary | ICD-10-CM | POA: Diagnosis not present

## 2016-09-24 DIAGNOSIS — R0782 Intercostal pain: Secondary | ICD-10-CM | POA: Diagnosis not present

## 2016-09-24 DIAGNOSIS — M47817 Spondylosis without myelopathy or radiculopathy, lumbosacral region: Secondary | ICD-10-CM | POA: Diagnosis not present

## 2016-10-10 DIAGNOSIS — M7050 Other bursitis of knee, unspecified knee: Secondary | ICD-10-CM | POA: Diagnosis not present

## 2016-10-10 DIAGNOSIS — I10 Essential (primary) hypertension: Secondary | ICD-10-CM | POA: Diagnosis not present

## 2016-10-10 DIAGNOSIS — E039 Hypothyroidism, unspecified: Secondary | ICD-10-CM | POA: Diagnosis not present

## 2016-10-15 DIAGNOSIS — Z1212 Encounter for screening for malignant neoplasm of rectum: Secondary | ICD-10-CM | POA: Diagnosis not present

## 2016-10-15 DIAGNOSIS — Z1211 Encounter for screening for malignant neoplasm of colon: Secondary | ICD-10-CM | POA: Diagnosis not present

## 2016-10-31 ENCOUNTER — Other Ambulatory Visit: Payer: Self-pay | Admitting: Pain Medicine

## 2016-10-31 DIAGNOSIS — M47817 Spondylosis without myelopathy or radiculopathy, lumbosacral region: Secondary | ICD-10-CM | POA: Diagnosis not present

## 2016-10-31 DIAGNOSIS — R0782 Intercostal pain: Secondary | ICD-10-CM | POA: Diagnosis not present

## 2016-10-31 DIAGNOSIS — M533 Sacrococcygeal disorders, not elsewhere classified: Secondary | ICD-10-CM | POA: Diagnosis not present

## 2016-10-31 DIAGNOSIS — M5416 Radiculopathy, lumbar region: Secondary | ICD-10-CM | POA: Diagnosis not present

## 2016-11-21 ENCOUNTER — Other Ambulatory Visit: Payer: Self-pay | Admitting: Pain Medicine

## 2016-11-27 DIAGNOSIS — M47817 Spondylosis without myelopathy or radiculopathy, lumbosacral region: Secondary | ICD-10-CM | POA: Diagnosis not present

## 2016-11-27 DIAGNOSIS — M533 Sacrococcygeal disorders, not elsewhere classified: Secondary | ICD-10-CM | POA: Diagnosis not present

## 2016-11-27 DIAGNOSIS — M5416 Radiculopathy, lumbar region: Secondary | ICD-10-CM | POA: Diagnosis not present

## 2016-11-27 DIAGNOSIS — R0782 Intercostal pain: Secondary | ICD-10-CM | POA: Diagnosis not present

## 2016-12-27 ENCOUNTER — Other Ambulatory Visit: Payer: Self-pay | Admitting: Pain Medicine

## 2016-12-27 DIAGNOSIS — R0782 Intercostal pain: Secondary | ICD-10-CM | POA: Diagnosis not present

## 2016-12-27 DIAGNOSIS — M5416 Radiculopathy, lumbar region: Secondary | ICD-10-CM | POA: Diagnosis not present

## 2016-12-27 DIAGNOSIS — G894 Chronic pain syndrome: Secondary | ICD-10-CM | POA: Diagnosis not present

## 2016-12-27 DIAGNOSIS — M533 Sacrococcygeal disorders, not elsewhere classified: Secondary | ICD-10-CM | POA: Diagnosis not present

## 2016-12-27 DIAGNOSIS — M47817 Spondylosis without myelopathy or radiculopathy, lumbosacral region: Secondary | ICD-10-CM | POA: Diagnosis not present

## 2016-12-27 DIAGNOSIS — Z79891 Long term (current) use of opiate analgesic: Secondary | ICD-10-CM | POA: Diagnosis not present

## 2017-01-09 DIAGNOSIS — I10 Essential (primary) hypertension: Secondary | ICD-10-CM | POA: Diagnosis not present

## 2017-01-09 DIAGNOSIS — E785 Hyperlipidemia, unspecified: Secondary | ICD-10-CM | POA: Diagnosis not present

## 2017-01-09 DIAGNOSIS — E038 Other specified hypothyroidism: Secondary | ICD-10-CM | POA: Diagnosis not present

## 2017-01-15 DIAGNOSIS — K069 Disorder of gingiva and edentulous alveolar ridge, unspecified: Secondary | ICD-10-CM | POA: Diagnosis not present

## 2017-01-15 DIAGNOSIS — R740 Nonspecific elevation of levels of transaminase and lactic acid dehydrogenase [LDH]: Secondary | ICD-10-CM | POA: Diagnosis not present

## 2017-01-18 DIAGNOSIS — R740 Nonspecific elevation of levels of transaminase and lactic acid dehydrogenase [LDH]: Secondary | ICD-10-CM | POA: Diagnosis not present

## 2017-01-18 DIAGNOSIS — R945 Abnormal results of liver function studies: Secondary | ICD-10-CM | POA: Diagnosis not present

## 2017-01-21 DIAGNOSIS — M5416 Radiculopathy, lumbar region: Secondary | ICD-10-CM | POA: Diagnosis not present

## 2017-01-21 DIAGNOSIS — M47817 Spondylosis without myelopathy or radiculopathy, lumbosacral region: Secondary | ICD-10-CM | POA: Diagnosis not present

## 2017-01-21 DIAGNOSIS — M533 Sacrococcygeal disorders, not elsewhere classified: Secondary | ICD-10-CM | POA: Diagnosis not present

## 2017-01-21 DIAGNOSIS — R0782 Intercostal pain: Secondary | ICD-10-CM | POA: Diagnosis not present

## 2017-02-11 DIAGNOSIS — M47817 Spondylosis without myelopathy or radiculopathy, lumbosacral region: Secondary | ICD-10-CM | POA: Diagnosis not present

## 2017-02-11 DIAGNOSIS — M5416 Radiculopathy, lumbar region: Secondary | ICD-10-CM | POA: Diagnosis not present

## 2017-02-11 DIAGNOSIS — M533 Sacrococcygeal disorders, not elsewhere classified: Secondary | ICD-10-CM | POA: Diagnosis not present

## 2017-02-11 DIAGNOSIS — R0782 Intercostal pain: Secondary | ICD-10-CM | POA: Diagnosis not present

## 2017-02-11 DIAGNOSIS — Z79891 Long term (current) use of opiate analgesic: Secondary | ICD-10-CM | POA: Diagnosis not present

## 2017-02-11 DIAGNOSIS — G894 Chronic pain syndrome: Secondary | ICD-10-CM | POA: Diagnosis not present

## 2017-02-15 DIAGNOSIS — J069 Acute upper respiratory infection, unspecified: Secondary | ICD-10-CM | POA: Diagnosis not present

## 2017-02-23 IMAGING — MR MR LUMBAR SPINE W/O CM
4 of 5 series · 15 of 48 positions shown · non-contrast
Comparison: 05/05/2014

CLINICAL DATA: Lumbar and lower extremity pain and paresthesias.
Low back pain for 14 years.

EXAM:
MRI LUMBAR SPINE WITHOUT CONTRAST
TECHNIQUE: Multiplanar, multisequence MR imaging of the lumbar spine was
performed. No intravenous contrast was administered.

[Series 2: T2 · sagittal · 4.0mm · 0.44mm/px · 6 of 17 slices shown (1 of 2)]
[im 1/17]
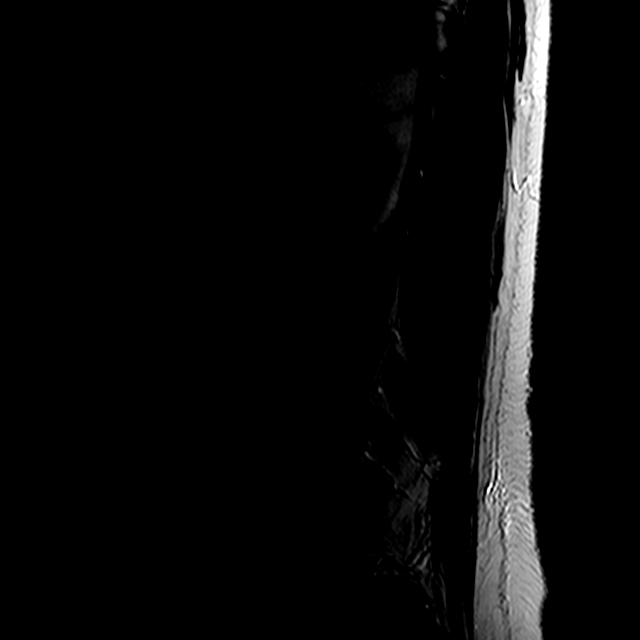
[im 4/17]
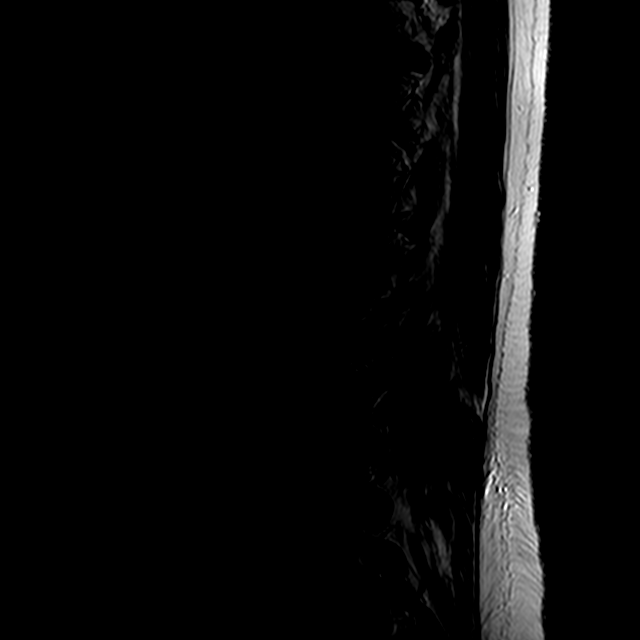
[im 7/17]
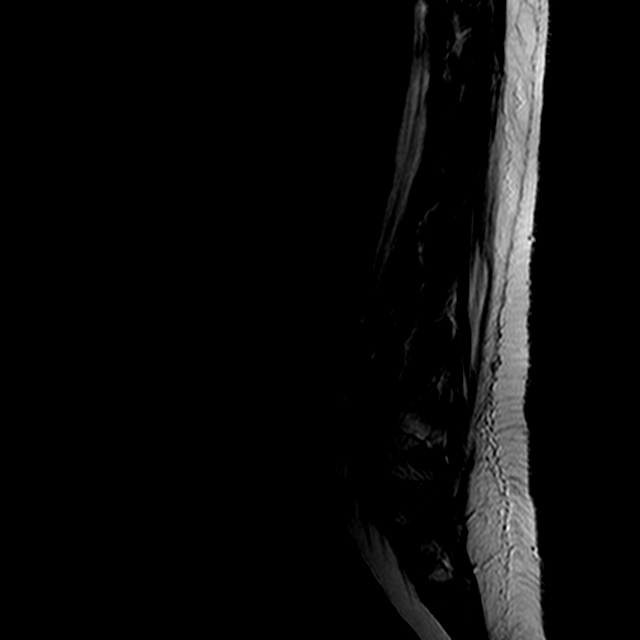
[im 10/17]
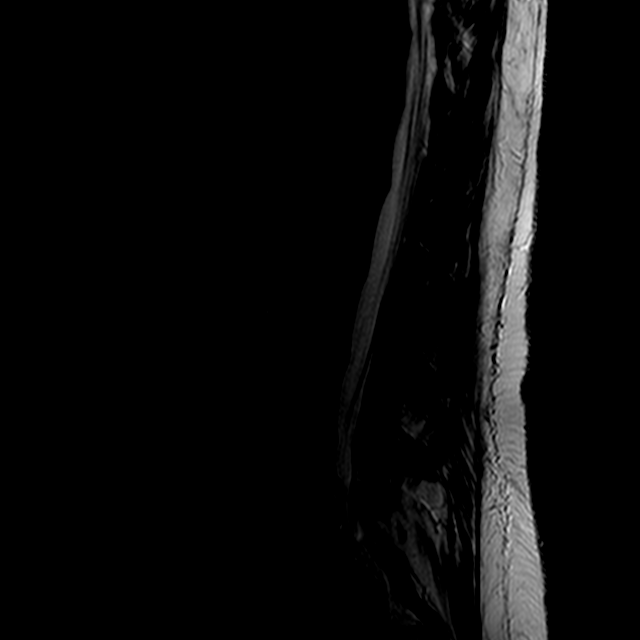
[im 13/17]
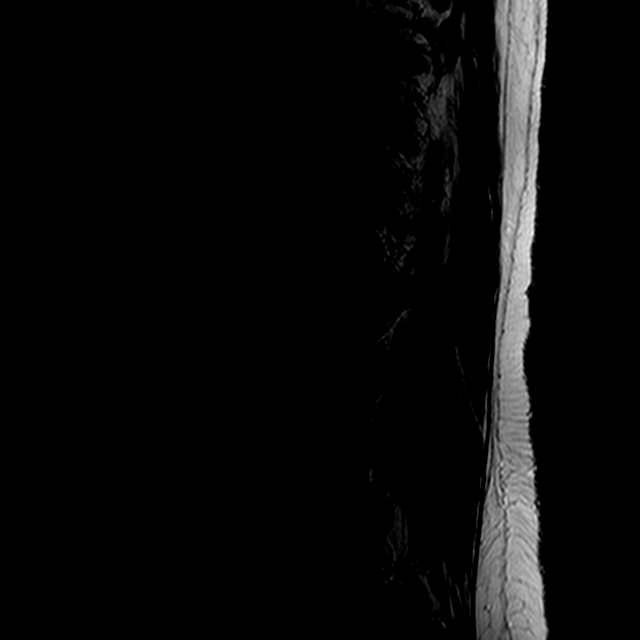
[im 17/17]
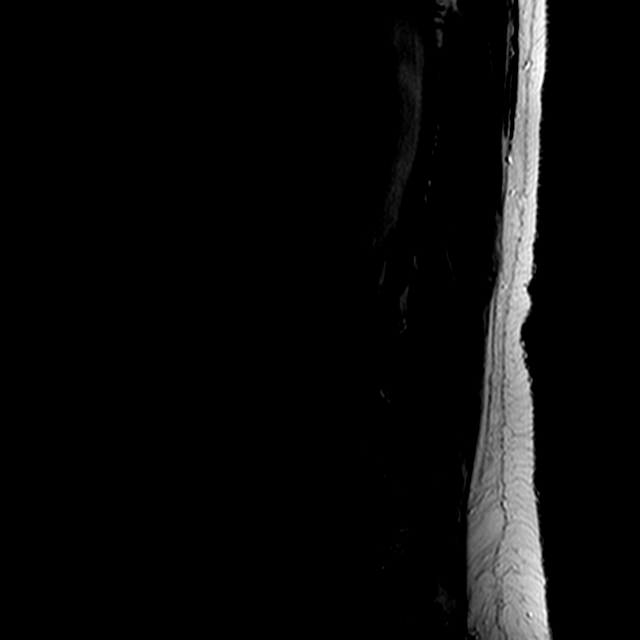

[Series 3: T1 · sagittal · 4.0mm · 0.44mm/px · 3 of 17 slices shown (1 of 2)]
[im 4/17]
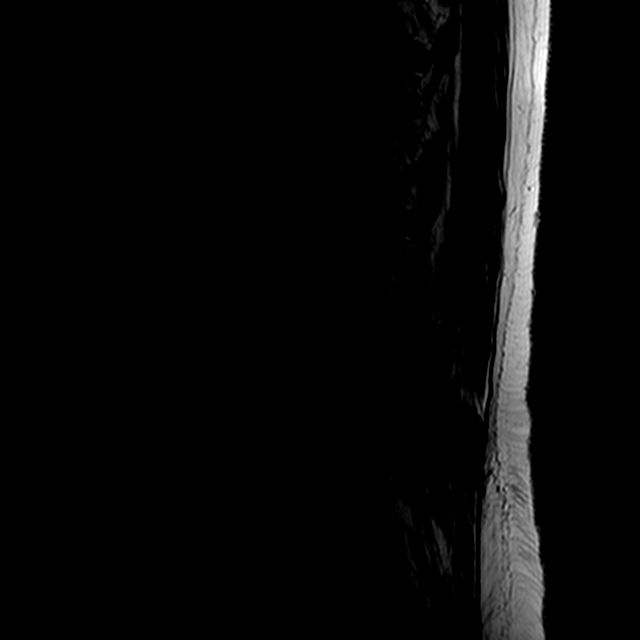
[im 10/17]
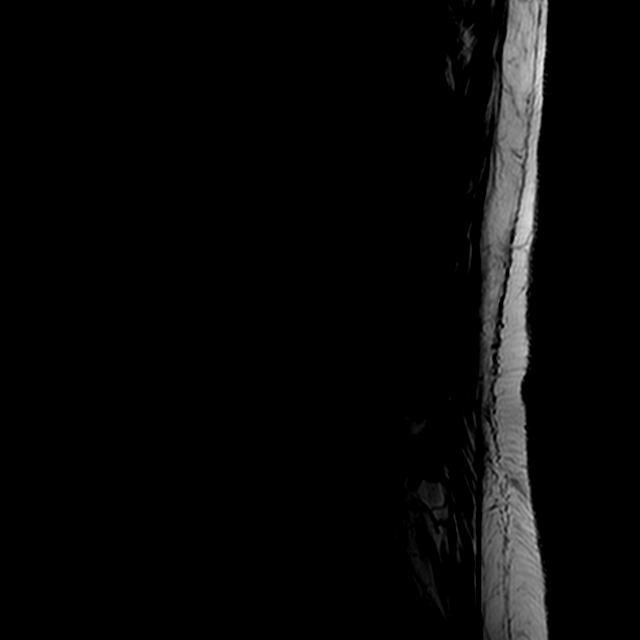
[im 17/17]
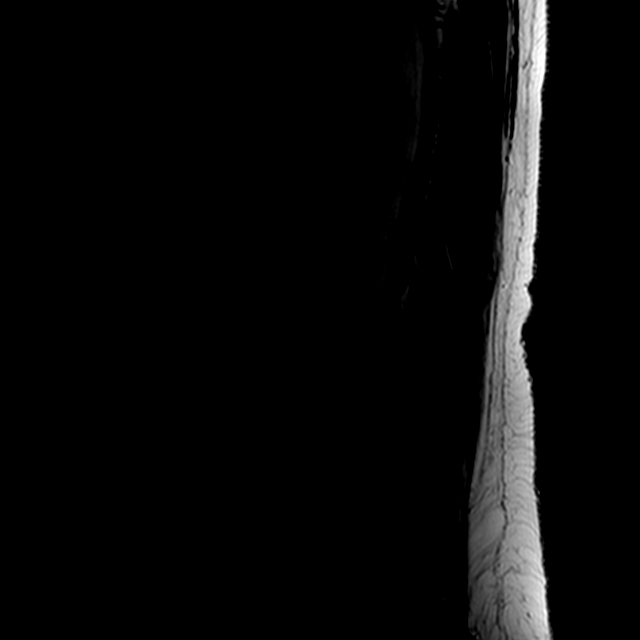

[Series 5: T2 · axial · 4.0mm · 0.39mm/px · z∈[-91,+79]mm · 3 of 40 slices shown (2 of 2)]
[im 6/40]
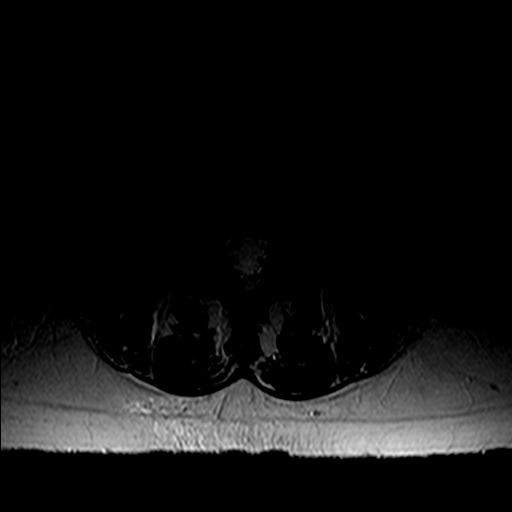
[im 20/40]
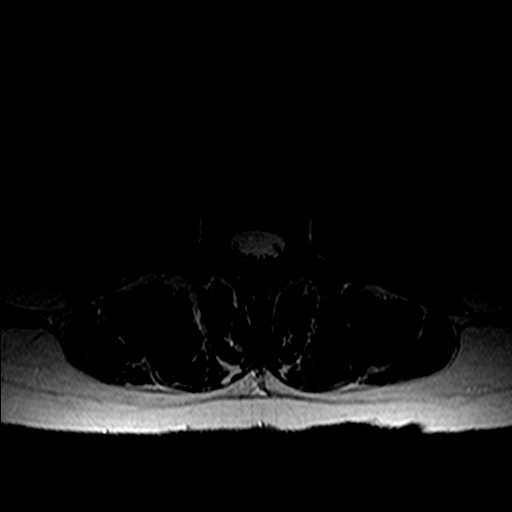
[im 34/40]
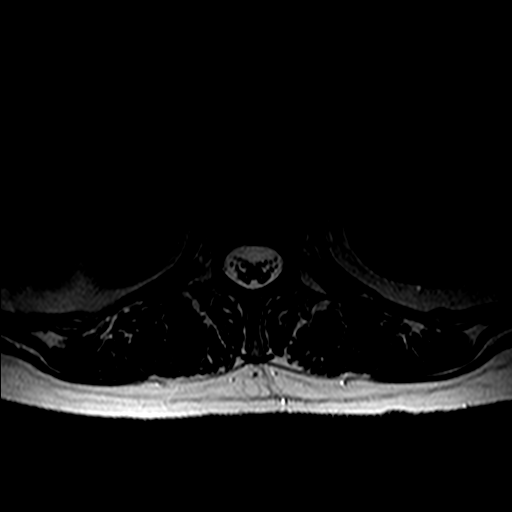

[Series 6: T1 · axial · 4.0mm · 0.39mm/px · z∈[-91,+79]mm · 3 of 40 slices shown (2 of 2)]
[im 6/40]
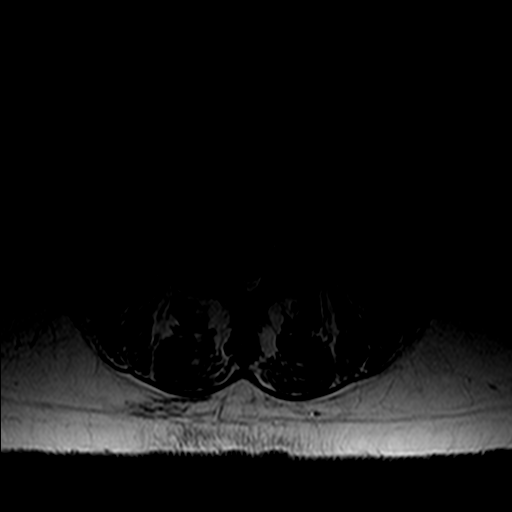
[im 20/40]
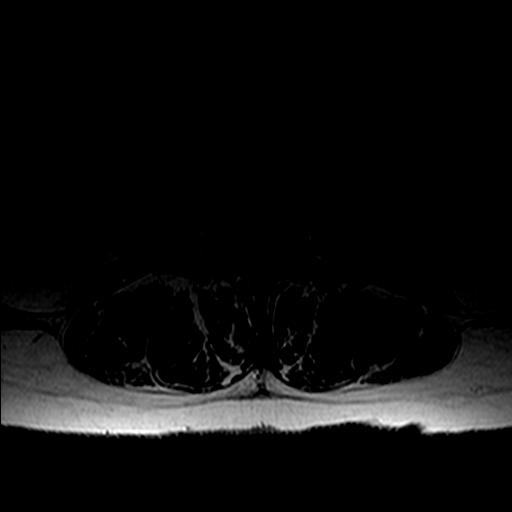
[im 34/40]
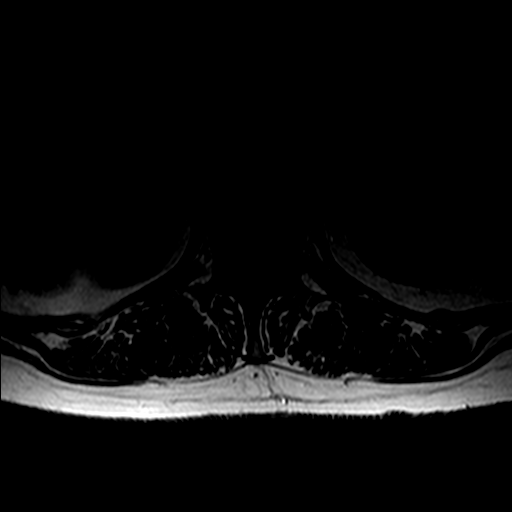

[15 of 48 positions shown; findings below may reference images not displayed]

FINDINGS: No marrow signal abnormality suggestive of fracture, infection, or
neoplasm. There is a chronic healed T12 superior endplate fracture
with mild height loss.

Normal conus signal and morphology. No perispinal abnormality to
explain back pain.

Degenerative changes:

L3-L4: Mild relative disc desiccation. No herniation or impingement.

L4-L5: Mild disc bulging.  Negative facets.  No impingement.

L5-S1:Greatest level of disc narrowing with endplate spurring and
circumferential disc bulging. Negative facets. No herniation or
impingement.
IMPRESSION: 1. No stenosis/impingement to explain lower extremity symptoms.
2. L5-S1 disc degeneration without progression from 8110.

## 2017-03-04 DIAGNOSIS — M5416 Radiculopathy, lumbar region: Secondary | ICD-10-CM | POA: Diagnosis not present

## 2017-03-04 DIAGNOSIS — M533 Sacrococcygeal disorders, not elsewhere classified: Secondary | ICD-10-CM | POA: Diagnosis not present

## 2017-03-04 DIAGNOSIS — G894 Chronic pain syndrome: Secondary | ICD-10-CM | POA: Diagnosis not present

## 2017-03-04 DIAGNOSIS — M47817 Spondylosis without myelopathy or radiculopathy, lumbosacral region: Secondary | ICD-10-CM | POA: Diagnosis not present

## 2017-03-04 DIAGNOSIS — M5136 Other intervertebral disc degeneration, lumbar region: Secondary | ICD-10-CM | POA: Diagnosis not present

## 2017-03-04 DIAGNOSIS — M5137 Other intervertebral disc degeneration, lumbosacral region: Secondary | ICD-10-CM | POA: Diagnosis not present

## 2017-03-04 DIAGNOSIS — R0782 Intercostal pain: Secondary | ICD-10-CM | POA: Diagnosis not present

## 2017-03-04 DIAGNOSIS — Z79891 Long term (current) use of opiate analgesic: Secondary | ICD-10-CM | POA: Diagnosis not present

## 2017-03-04 DIAGNOSIS — Z96641 Presence of right artificial hip joint: Secondary | ICD-10-CM | POA: Diagnosis not present

## 2017-03-06 DIAGNOSIS — Z79899 Other long term (current) drug therapy: Secondary | ICD-10-CM | POA: Diagnosis not present

## 2017-03-06 DIAGNOSIS — G8929 Other chronic pain: Secondary | ICD-10-CM | POA: Diagnosis not present

## 2017-03-06 DIAGNOSIS — I10 Essential (primary) hypertension: Secondary | ICD-10-CM | POA: Diagnosis not present

## 2017-03-06 DIAGNOSIS — Z5181 Encounter for therapeutic drug level monitoring: Secondary | ICD-10-CM | POA: Diagnosis not present

## 2017-03-06 DIAGNOSIS — M5441 Lumbago with sciatica, right side: Secondary | ICD-10-CM | POA: Diagnosis not present

## 2017-04-05 DIAGNOSIS — M5441 Lumbago with sciatica, right side: Secondary | ICD-10-CM | POA: Diagnosis not present

## 2017-04-05 DIAGNOSIS — G8929 Other chronic pain: Secondary | ICD-10-CM | POA: Diagnosis not present

## 2017-04-05 DIAGNOSIS — Z79899 Other long term (current) drug therapy: Secondary | ICD-10-CM | POA: Diagnosis not present

## 2017-04-05 DIAGNOSIS — I10 Essential (primary) hypertension: Secondary | ICD-10-CM | POA: Diagnosis not present

## 2017-04-05 DIAGNOSIS — Z5181 Encounter for therapeutic drug level monitoring: Secondary | ICD-10-CM | POA: Diagnosis not present

## 2017-04-30 DIAGNOSIS — D485 Neoplasm of uncertain behavior of skin: Secondary | ICD-10-CM | POA: Diagnosis not present

## 2017-04-30 DIAGNOSIS — L820 Inflamed seborrheic keratosis: Secondary | ICD-10-CM | POA: Diagnosis not present

## 2017-05-06 DIAGNOSIS — I10 Essential (primary) hypertension: Secondary | ICD-10-CM | POA: Diagnosis not present

## 2017-05-06 DIAGNOSIS — Z79899 Other long term (current) drug therapy: Secondary | ICD-10-CM | POA: Diagnosis not present

## 2017-05-06 DIAGNOSIS — G8929 Other chronic pain: Secondary | ICD-10-CM | POA: Diagnosis not present

## 2017-05-06 DIAGNOSIS — M5441 Lumbago with sciatica, right side: Secondary | ICD-10-CM | POA: Diagnosis not present

## 2017-05-06 DIAGNOSIS — Z5181 Encounter for therapeutic drug level monitoring: Secondary | ICD-10-CM | POA: Diagnosis not present

## 2017-06-05 DIAGNOSIS — G8929 Other chronic pain: Secondary | ICD-10-CM | POA: Diagnosis not present

## 2017-06-05 DIAGNOSIS — Z5181 Encounter for therapeutic drug level monitoring: Secondary | ICD-10-CM | POA: Diagnosis not present

## 2017-06-05 DIAGNOSIS — I10 Essential (primary) hypertension: Secondary | ICD-10-CM | POA: Diagnosis not present

## 2017-06-05 DIAGNOSIS — M5441 Lumbago with sciatica, right side: Secondary | ICD-10-CM | POA: Diagnosis not present

## 2017-06-05 DIAGNOSIS — L739 Follicular disorder, unspecified: Secondary | ICD-10-CM | POA: Diagnosis not present

## 2017-07-05 DIAGNOSIS — M5441 Lumbago with sciatica, right side: Secondary | ICD-10-CM | POA: Diagnosis not present

## 2017-07-05 DIAGNOSIS — E039 Hypothyroidism, unspecified: Secondary | ICD-10-CM | POA: Diagnosis not present

## 2017-07-05 DIAGNOSIS — Z5181 Encounter for therapeutic drug level monitoring: Secondary | ICD-10-CM | POA: Diagnosis not present

## 2017-07-05 DIAGNOSIS — I10 Essential (primary) hypertension: Secondary | ICD-10-CM | POA: Diagnosis not present

## 2017-07-05 DIAGNOSIS — G8929 Other chronic pain: Secondary | ICD-10-CM | POA: Diagnosis not present

## 2017-07-12 DIAGNOSIS — J0100 Acute maxillary sinusitis, unspecified: Secondary | ICD-10-CM | POA: Diagnosis not present

## 2017-07-26 DIAGNOSIS — M62838 Other muscle spasm: Secondary | ICD-10-CM | POA: Diagnosis not present

## 2017-08-02 DIAGNOSIS — M5441 Lumbago with sciatica, right side: Secondary | ICD-10-CM | POA: Diagnosis not present

## 2017-08-02 DIAGNOSIS — Z5181 Encounter for therapeutic drug level monitoring: Secondary | ICD-10-CM | POA: Diagnosis not present

## 2017-08-02 DIAGNOSIS — E039 Hypothyroidism, unspecified: Secondary | ICD-10-CM | POA: Diagnosis not present

## 2017-08-02 DIAGNOSIS — G8929 Other chronic pain: Secondary | ICD-10-CM | POA: Diagnosis not present

## 2017-08-02 DIAGNOSIS — I10 Essential (primary) hypertension: Secondary | ICD-10-CM | POA: Diagnosis not present

## 2017-08-19 DIAGNOSIS — M545 Low back pain: Secondary | ICD-10-CM | POA: Diagnosis not present

## 2017-08-29 DIAGNOSIS — M791 Myalgia, unspecified site: Secondary | ICD-10-CM | POA: Diagnosis not present

## 2017-09-03 DIAGNOSIS — I10 Essential (primary) hypertension: Secondary | ICD-10-CM | POA: Diagnosis not present

## 2017-09-03 DIAGNOSIS — Z5181 Encounter for therapeutic drug level monitoring: Secondary | ICD-10-CM | POA: Diagnosis not present

## 2017-09-03 DIAGNOSIS — M5441 Lumbago with sciatica, right side: Secondary | ICD-10-CM | POA: Diagnosis not present

## 2017-09-03 DIAGNOSIS — G8929 Other chronic pain: Secondary | ICD-10-CM | POA: Diagnosis not present

## 2017-09-03 DIAGNOSIS — E039 Hypothyroidism, unspecified: Secondary | ICD-10-CM | POA: Diagnosis not present

## 2017-09-10 DIAGNOSIS — L853 Xerosis cutis: Secondary | ICD-10-CM | POA: Diagnosis not present

## 2017-09-10 DIAGNOSIS — L570 Actinic keratosis: Secondary | ICD-10-CM | POA: Diagnosis not present

## 2017-09-10 DIAGNOSIS — R233 Spontaneous ecchymoses: Secondary | ICD-10-CM | POA: Diagnosis not present

## 2017-10-04 DIAGNOSIS — M5441 Lumbago with sciatica, right side: Secondary | ICD-10-CM | POA: Diagnosis not present

## 2017-10-04 DIAGNOSIS — G8929 Other chronic pain: Secondary | ICD-10-CM | POA: Diagnosis not present

## 2017-10-04 DIAGNOSIS — Z5181 Encounter for therapeutic drug level monitoring: Secondary | ICD-10-CM | POA: Diagnosis not present

## 2017-10-04 DIAGNOSIS — E039 Hypothyroidism, unspecified: Secondary | ICD-10-CM | POA: Diagnosis not present

## 2017-10-04 DIAGNOSIS — I10 Essential (primary) hypertension: Secondary | ICD-10-CM | POA: Diagnosis not present

## 2017-11-04 DIAGNOSIS — Z5181 Encounter for therapeutic drug level monitoring: Secondary | ICD-10-CM | POA: Diagnosis not present

## 2017-11-04 DIAGNOSIS — M5441 Lumbago with sciatica, right side: Secondary | ICD-10-CM | POA: Diagnosis not present

## 2017-11-04 DIAGNOSIS — G8929 Other chronic pain: Secondary | ICD-10-CM | POA: Diagnosis not present

## 2017-11-04 DIAGNOSIS — E039 Hypothyroidism, unspecified: Secondary | ICD-10-CM | POA: Diagnosis not present

## 2017-11-04 DIAGNOSIS — M79604 Pain in right leg: Secondary | ICD-10-CM | POA: Diagnosis not present

## 2017-11-04 DIAGNOSIS — I10 Essential (primary) hypertension: Secondary | ICD-10-CM | POA: Diagnosis not present

## 2017-11-27 DIAGNOSIS — M62838 Other muscle spasm: Secondary | ICD-10-CM | POA: Diagnosis not present

## 2017-12-06 DIAGNOSIS — Z5181 Encounter for therapeutic drug level monitoring: Secondary | ICD-10-CM | POA: Diagnosis not present

## 2017-12-06 DIAGNOSIS — G8929 Other chronic pain: Secondary | ICD-10-CM | POA: Diagnosis not present

## 2017-12-06 DIAGNOSIS — E039 Hypothyroidism, unspecified: Secondary | ICD-10-CM | POA: Diagnosis not present

## 2017-12-06 DIAGNOSIS — M5441 Lumbago with sciatica, right side: Secondary | ICD-10-CM | POA: Diagnosis not present

## 2017-12-06 DIAGNOSIS — I10 Essential (primary) hypertension: Secondary | ICD-10-CM | POA: Diagnosis not present

## 2017-12-25 DIAGNOSIS — R21 Rash and other nonspecific skin eruption: Secondary | ICD-10-CM | POA: Diagnosis not present

## 2017-12-25 DIAGNOSIS — I10 Essential (primary) hypertension: Secondary | ICD-10-CM | POA: Diagnosis not present

## 2017-12-25 DIAGNOSIS — E039 Hypothyroidism, unspecified: Secondary | ICD-10-CM | POA: Diagnosis not present

## 2017-12-25 DIAGNOSIS — G8929 Other chronic pain: Secondary | ICD-10-CM | POA: Diagnosis not present

## 2018-01-03 DIAGNOSIS — M545 Low back pain: Secondary | ICD-10-CM | POA: Diagnosis not present

## 2018-01-03 DIAGNOSIS — Z79899 Other long term (current) drug therapy: Secondary | ICD-10-CM | POA: Diagnosis not present

## 2018-01-03 DIAGNOSIS — I10 Essential (primary) hypertension: Secondary | ICD-10-CM | POA: Diagnosis not present

## 2018-01-03 DIAGNOSIS — Z5181 Encounter for therapeutic drug level monitoring: Secondary | ICD-10-CM | POA: Diagnosis not present

## 2018-01-03 DIAGNOSIS — G894 Chronic pain syndrome: Secondary | ICD-10-CM | POA: Diagnosis not present

## 2018-01-15 DIAGNOSIS — B351 Tinea unguium: Secondary | ICD-10-CM | POA: Diagnosis not present

## 2018-01-15 DIAGNOSIS — L820 Inflamed seborrheic keratosis: Secondary | ICD-10-CM | POA: Diagnosis not present

## 2018-01-19 DIAGNOSIS — R197 Diarrhea, unspecified: Secondary | ICD-10-CM | POA: Diagnosis not present

## 2018-01-28 DIAGNOSIS — J0100 Acute maxillary sinusitis, unspecified: Secondary | ICD-10-CM | POA: Diagnosis not present

## 2018-02-05 DIAGNOSIS — M545 Low back pain: Secondary | ICD-10-CM | POA: Diagnosis not present

## 2018-02-05 DIAGNOSIS — I10 Essential (primary) hypertension: Secondary | ICD-10-CM | POA: Diagnosis not present

## 2018-02-05 DIAGNOSIS — Z5181 Encounter for therapeutic drug level monitoring: Secondary | ICD-10-CM | POA: Diagnosis not present

## 2018-02-05 DIAGNOSIS — G8929 Other chronic pain: Secondary | ICD-10-CM | POA: Diagnosis not present

## 2018-03-05 DIAGNOSIS — G8929 Other chronic pain: Secondary | ICD-10-CM | POA: Diagnosis not present

## 2018-03-05 DIAGNOSIS — I10 Essential (primary) hypertension: Secondary | ICD-10-CM | POA: Diagnosis not present

## 2018-03-05 DIAGNOSIS — M545 Low back pain: Secondary | ICD-10-CM | POA: Diagnosis not present

## 2018-03-05 DIAGNOSIS — Z5181 Encounter for therapeutic drug level monitoring: Secondary | ICD-10-CM | POA: Diagnosis not present

## 2018-03-05 DIAGNOSIS — Z79899 Other long term (current) drug therapy: Secondary | ICD-10-CM | POA: Diagnosis not present

## 2018-03-19 DIAGNOSIS — I10 Essential (primary) hypertension: Secondary | ICD-10-CM | POA: Diagnosis not present

## 2018-03-19 DIAGNOSIS — M25561 Pain in right knee: Secondary | ICD-10-CM | POA: Diagnosis not present

## 2018-03-19 DIAGNOSIS — B351 Tinea unguium: Secondary | ICD-10-CM | POA: Diagnosis not present

## 2018-03-19 DIAGNOSIS — Z0000 Encounter for general adult medical examination without abnormal findings: Secondary | ICD-10-CM | POA: Diagnosis not present

## 2018-04-03 DIAGNOSIS — N760 Acute vaginitis: Secondary | ICD-10-CM | POA: Diagnosis not present

## 2018-04-03 DIAGNOSIS — N3001 Acute cystitis with hematuria: Secondary | ICD-10-CM | POA: Diagnosis not present

## 2018-04-04 DIAGNOSIS — Z5181 Encounter for therapeutic drug level monitoring: Secondary | ICD-10-CM | POA: Diagnosis not present

## 2018-04-04 DIAGNOSIS — G8929 Other chronic pain: Secondary | ICD-10-CM | POA: Diagnosis not present

## 2018-04-04 DIAGNOSIS — I10 Essential (primary) hypertension: Secondary | ICD-10-CM | POA: Diagnosis not present

## 2018-04-04 DIAGNOSIS — Z79899 Other long term (current) drug therapy: Secondary | ICD-10-CM | POA: Diagnosis not present

## 2018-04-04 DIAGNOSIS — M545 Low back pain: Secondary | ICD-10-CM | POA: Diagnosis not present

## 2018-04-11 DIAGNOSIS — Z1231 Encounter for screening mammogram for malignant neoplasm of breast: Secondary | ICD-10-CM | POA: Diagnosis not present

## 2018-04-15 DIAGNOSIS — N760 Acute vaginitis: Secondary | ICD-10-CM | POA: Diagnosis not present

## 2018-04-30 DIAGNOSIS — B351 Tinea unguium: Secondary | ICD-10-CM | POA: Diagnosis not present

## 2018-04-30 DIAGNOSIS — L280 Lichen simplex chronicus: Secondary | ICD-10-CM | POA: Diagnosis not present

## 2018-05-05 DIAGNOSIS — M545 Low back pain: Secondary | ICD-10-CM | POA: Diagnosis not present

## 2018-05-05 DIAGNOSIS — G8929 Other chronic pain: Secondary | ICD-10-CM | POA: Diagnosis not present

## 2018-05-05 DIAGNOSIS — J329 Chronic sinusitis, unspecified: Secondary | ICD-10-CM | POA: Diagnosis not present

## 2018-05-05 DIAGNOSIS — Z1389 Encounter for screening for other disorder: Secondary | ICD-10-CM | POA: Diagnosis not present

## 2018-05-05 DIAGNOSIS — Z5181 Encounter for therapeutic drug level monitoring: Secondary | ICD-10-CM | POA: Diagnosis not present

## 2018-05-05 DIAGNOSIS — I10 Essential (primary) hypertension: Secondary | ICD-10-CM | POA: Diagnosis not present

## 2018-05-06 DIAGNOSIS — R51 Headache: Secondary | ICD-10-CM | POA: Diagnosis not present

## 2018-05-06 DIAGNOSIS — R05 Cough: Secondary | ICD-10-CM | POA: Diagnosis not present

## 2018-05-06 DIAGNOSIS — M940 Chondrocostal junction syndrome [Tietze]: Secondary | ICD-10-CM | POA: Diagnosis not present

## 2018-05-06 DIAGNOSIS — J189 Pneumonia, unspecified organism: Secondary | ICD-10-CM | POA: Diagnosis not present

## 2018-05-19 DIAGNOSIS — J029 Acute pharyngitis, unspecified: Secondary | ICD-10-CM | POA: Diagnosis not present

## 2018-05-22 DIAGNOSIS — J0110 Acute frontal sinusitis, unspecified: Secondary | ICD-10-CM | POA: Diagnosis not present

## 2018-05-22 DIAGNOSIS — J209 Acute bronchitis, unspecified: Secondary | ICD-10-CM | POA: Diagnosis not present

## 2018-05-26 DIAGNOSIS — J209 Acute bronchitis, unspecified: Secondary | ICD-10-CM | POA: Diagnosis not present

## 2018-06-04 DIAGNOSIS — R6884 Jaw pain: Secondary | ICD-10-CM | POA: Diagnosis not present

## 2018-06-04 DIAGNOSIS — R070 Pain in throat: Secondary | ICD-10-CM | POA: Diagnosis not present

## 2018-06-06 DIAGNOSIS — M545 Low back pain: Secondary | ICD-10-CM | POA: Diagnosis not present

## 2018-06-06 DIAGNOSIS — Z5181 Encounter for therapeutic drug level monitoring: Secondary | ICD-10-CM | POA: Diagnosis not present

## 2018-06-06 DIAGNOSIS — Z79899 Other long term (current) drug therapy: Secondary | ICD-10-CM | POA: Diagnosis not present

## 2018-06-06 DIAGNOSIS — I10 Essential (primary) hypertension: Secondary | ICD-10-CM | POA: Diagnosis not present

## 2018-06-06 DIAGNOSIS — G8929 Other chronic pain: Secondary | ICD-10-CM | POA: Diagnosis not present

## 2018-06-29 DIAGNOSIS — H6990 Unspecified Eustachian tube disorder, unspecified ear: Secondary | ICD-10-CM | POA: Diagnosis not present

## 2018-06-29 DIAGNOSIS — H66009 Acute suppurative otitis media without spontaneous rupture of ear drum, unspecified ear: Secondary | ICD-10-CM | POA: Diagnosis not present

## 2018-06-29 DIAGNOSIS — J309 Allergic rhinitis, unspecified: Secondary | ICD-10-CM | POA: Diagnosis not present

## 2018-06-29 DIAGNOSIS — H60539 Acute contact otitis externa, unspecified ear: Secondary | ICD-10-CM | POA: Diagnosis not present

## 2018-07-07 DIAGNOSIS — Z5181 Encounter for therapeutic drug level monitoring: Secondary | ICD-10-CM | POA: Diagnosis not present

## 2018-07-07 DIAGNOSIS — M545 Low back pain: Secondary | ICD-10-CM | POA: Diagnosis not present

## 2018-07-07 DIAGNOSIS — I10 Essential (primary) hypertension: Secondary | ICD-10-CM | POA: Diagnosis not present

## 2018-07-07 DIAGNOSIS — G8929 Other chronic pain: Secondary | ICD-10-CM | POA: Diagnosis not present

## 2018-07-07 DIAGNOSIS — Z79899 Other long term (current) drug therapy: Secondary | ICD-10-CM | POA: Diagnosis not present

## 2018-08-06 DIAGNOSIS — G8929 Other chronic pain: Secondary | ICD-10-CM | POA: Diagnosis not present

## 2018-08-06 DIAGNOSIS — Z5181 Encounter for therapeutic drug level monitoring: Secondary | ICD-10-CM | POA: Diagnosis not present

## 2018-08-06 DIAGNOSIS — Z79899 Other long term (current) drug therapy: Secondary | ICD-10-CM | POA: Diagnosis not present

## 2018-08-06 DIAGNOSIS — I10 Essential (primary) hypertension: Secondary | ICD-10-CM | POA: Diagnosis not present

## 2018-08-06 DIAGNOSIS — M545 Low back pain: Secondary | ICD-10-CM | POA: Diagnosis not present

## 2018-09-04 DIAGNOSIS — M25512 Pain in left shoulder: Secondary | ICD-10-CM | POA: Diagnosis not present

## 2018-09-04 DIAGNOSIS — M940 Chondrocostal junction syndrome [Tietze]: Secondary | ICD-10-CM | POA: Diagnosis not present

## 2018-09-08 DIAGNOSIS — M545 Low back pain: Secondary | ICD-10-CM | POA: Diagnosis not present

## 2018-09-08 DIAGNOSIS — G8929 Other chronic pain: Secondary | ICD-10-CM | POA: Diagnosis not present

## 2018-09-08 DIAGNOSIS — I10 Essential (primary) hypertension: Secondary | ICD-10-CM | POA: Diagnosis not present

## 2018-09-08 DIAGNOSIS — Z79899 Other long term (current) drug therapy: Secondary | ICD-10-CM | POA: Diagnosis not present

## 2018-09-08 DIAGNOSIS — Z5181 Encounter for therapeutic drug level monitoring: Secondary | ICD-10-CM | POA: Diagnosis not present

## 2018-09-24 DIAGNOSIS — R110 Nausea: Secondary | ICD-10-CM | POA: Diagnosis not present

## 2018-09-24 DIAGNOSIS — R6889 Other general symptoms and signs: Secondary | ICD-10-CM | POA: Diagnosis not present

## 2018-09-24 DIAGNOSIS — R42 Dizziness and giddiness: Secondary | ICD-10-CM | POA: Diagnosis not present

## 2018-10-08 DIAGNOSIS — G8929 Other chronic pain: Secondary | ICD-10-CM | POA: Diagnosis not present

## 2018-10-08 DIAGNOSIS — M545 Low back pain: Secondary | ICD-10-CM | POA: Diagnosis not present

## 2018-10-08 DIAGNOSIS — I10 Essential (primary) hypertension: Secondary | ICD-10-CM | POA: Diagnosis not present

## 2018-10-08 DIAGNOSIS — E039 Hypothyroidism, unspecified: Secondary | ICD-10-CM | POA: Diagnosis not present

## 2018-10-08 DIAGNOSIS — Z5181 Encounter for therapeutic drug level monitoring: Secondary | ICD-10-CM | POA: Diagnosis not present

## 2018-11-01 DIAGNOSIS — R197 Diarrhea, unspecified: Secondary | ICD-10-CM | POA: Diagnosis not present

## 2018-11-01 DIAGNOSIS — R6889 Other general symptoms and signs: Secondary | ICD-10-CM | POA: Diagnosis not present

## 2018-11-07 DIAGNOSIS — M545 Low back pain: Secondary | ICD-10-CM | POA: Diagnosis not present

## 2018-11-07 DIAGNOSIS — I10 Essential (primary) hypertension: Secondary | ICD-10-CM | POA: Diagnosis not present

## 2018-11-07 DIAGNOSIS — G8929 Other chronic pain: Secondary | ICD-10-CM | POA: Diagnosis not present

## 2018-11-07 DIAGNOSIS — Z5181 Encounter for therapeutic drug level monitoring: Secondary | ICD-10-CM | POA: Diagnosis not present

## 2018-11-07 DIAGNOSIS — E039 Hypothyroidism, unspecified: Secondary | ICD-10-CM | POA: Diagnosis not present

## 2018-12-05 DIAGNOSIS — G8929 Other chronic pain: Secondary | ICD-10-CM | POA: Diagnosis not present

## 2018-12-05 DIAGNOSIS — I10 Essential (primary) hypertension: Secondary | ICD-10-CM | POA: Diagnosis not present

## 2018-12-05 DIAGNOSIS — Z5181 Encounter for therapeutic drug level monitoring: Secondary | ICD-10-CM | POA: Diagnosis not present

## 2018-12-05 DIAGNOSIS — E039 Hypothyroidism, unspecified: Secondary | ICD-10-CM | POA: Diagnosis not present

## 2018-12-05 DIAGNOSIS — M545 Low back pain: Secondary | ICD-10-CM | POA: Diagnosis not present

## 2019-01-02 DIAGNOSIS — G8929 Other chronic pain: Secondary | ICD-10-CM | POA: Diagnosis not present

## 2019-01-02 DIAGNOSIS — R825 Elevated urine levels of drugs, medicaments and biological substances: Secondary | ICD-10-CM | POA: Diagnosis not present

## 2019-01-02 DIAGNOSIS — I10 Essential (primary) hypertension: Secondary | ICD-10-CM | POA: Diagnosis not present

## 2019-01-02 DIAGNOSIS — E039 Hypothyroidism, unspecified: Secondary | ICD-10-CM | POA: Diagnosis not present

## 2019-01-02 DIAGNOSIS — M545 Low back pain: Secondary | ICD-10-CM | POA: Diagnosis not present

## 2019-01-02 DIAGNOSIS — Z0283 Encounter for blood-alcohol and blood-drug test: Secondary | ICD-10-CM | POA: Diagnosis not present

## 2019-01-02 DIAGNOSIS — Z5189 Encounter for other specified aftercare: Secondary | ICD-10-CM | POA: Diagnosis not present

## 2019-02-02 DIAGNOSIS — M545 Low back pain: Secondary | ICD-10-CM | POA: Diagnosis not present

## 2019-02-02 DIAGNOSIS — E039 Hypothyroidism, unspecified: Secondary | ICD-10-CM | POA: Diagnosis not present

## 2019-02-02 DIAGNOSIS — J45909 Unspecified asthma, uncomplicated: Secondary | ICD-10-CM | POA: Diagnosis not present

## 2019-02-02 DIAGNOSIS — Z5181 Encounter for therapeutic drug level monitoring: Secondary | ICD-10-CM | POA: Diagnosis not present

## 2019-02-02 DIAGNOSIS — G8929 Other chronic pain: Secondary | ICD-10-CM | POA: Diagnosis not present

## 2019-02-02 DIAGNOSIS — I10 Essential (primary) hypertension: Secondary | ICD-10-CM | POA: Diagnosis not present

## 2019-02-28 DIAGNOSIS — R0989 Other specified symptoms and signs involving the circulatory and respiratory systems: Secondary | ICD-10-CM | POA: Diagnosis not present

## 2019-02-28 DIAGNOSIS — R509 Fever, unspecified: Secondary | ICD-10-CM | POA: Diagnosis not present

## 2019-02-28 DIAGNOSIS — Z03818 Encounter for observation for suspected exposure to other biological agents ruled out: Secondary | ICD-10-CM | POA: Diagnosis not present

## 2019-03-04 DIAGNOSIS — J45909 Unspecified asthma, uncomplicated: Secondary | ICD-10-CM | POA: Diagnosis not present

## 2019-03-04 DIAGNOSIS — G8929 Other chronic pain: Secondary | ICD-10-CM | POA: Diagnosis not present

## 2019-03-04 DIAGNOSIS — M545 Low back pain: Secondary | ICD-10-CM | POA: Diagnosis not present

## 2019-03-04 DIAGNOSIS — E039 Hypothyroidism, unspecified: Secondary | ICD-10-CM | POA: Diagnosis not present

## 2019-03-04 DIAGNOSIS — I10 Essential (primary) hypertension: Secondary | ICD-10-CM | POA: Diagnosis not present

## 2019-03-09 DIAGNOSIS — Z79899 Other long term (current) drug therapy: Secondary | ICD-10-CM | POA: Diagnosis not present

## 2019-03-09 DIAGNOSIS — I10 Essential (primary) hypertension: Secondary | ICD-10-CM | POA: Diagnosis not present

## 2019-03-09 DIAGNOSIS — Z79891 Long term (current) use of opiate analgesic: Secondary | ICD-10-CM | POA: Diagnosis not present

## 2019-03-09 DIAGNOSIS — M1990 Unspecified osteoarthritis, unspecified site: Secondary | ICD-10-CM | POA: Diagnosis not present

## 2019-03-09 DIAGNOSIS — J449 Chronic obstructive pulmonary disease, unspecified: Secondary | ICD-10-CM | POA: Diagnosis not present

## 2019-03-09 DIAGNOSIS — K859 Acute pancreatitis without necrosis or infection, unspecified: Secondary | ICD-10-CM | POA: Diagnosis not present

## 2019-03-09 DIAGNOSIS — E039 Hypothyroidism, unspecified: Secondary | ICD-10-CM | POA: Diagnosis not present

## 2019-03-11 DIAGNOSIS — K859 Acute pancreatitis without necrosis or infection, unspecified: Secondary | ICD-10-CM | POA: Diagnosis not present

## 2019-03-25 DIAGNOSIS — K859 Acute pancreatitis without necrosis or infection, unspecified: Secondary | ICD-10-CM | POA: Diagnosis not present

## 2019-03-25 DIAGNOSIS — K760 Fatty (change of) liver, not elsewhere classified: Secondary | ICD-10-CM | POA: Diagnosis not present

## 2019-03-25 DIAGNOSIS — R911 Solitary pulmonary nodule: Secondary | ICD-10-CM | POA: Diagnosis not present

## 2019-03-26 DIAGNOSIS — K8591 Acute pancreatitis with uninfected necrosis, unspecified: Secondary | ICD-10-CM | POA: Diagnosis not present

## 2019-04-03 DIAGNOSIS — G894 Chronic pain syndrome: Secondary | ICD-10-CM | POA: Diagnosis not present

## 2019-04-03 DIAGNOSIS — Z79899 Other long term (current) drug therapy: Secondary | ICD-10-CM | POA: Diagnosis not present

## 2019-04-03 DIAGNOSIS — Z5181 Encounter for therapeutic drug level monitoring: Secondary | ICD-10-CM | POA: Diagnosis not present

## 2019-04-03 DIAGNOSIS — M545 Low back pain: Secondary | ICD-10-CM | POA: Diagnosis not present

## 2019-05-04 DIAGNOSIS — M545 Low back pain: Secondary | ICD-10-CM | POA: Diagnosis not present

## 2019-05-04 DIAGNOSIS — G8929 Other chronic pain: Secondary | ICD-10-CM | POA: Diagnosis not present

## 2019-05-04 DIAGNOSIS — Z5181 Encounter for therapeutic drug level monitoring: Secondary | ICD-10-CM | POA: Diagnosis not present

## 2019-05-04 DIAGNOSIS — E039 Hypothyroidism, unspecified: Secondary | ICD-10-CM | POA: Diagnosis not present

## 2019-05-04 DIAGNOSIS — I10 Essential (primary) hypertension: Secondary | ICD-10-CM | POA: Diagnosis not present

## 2019-06-05 DIAGNOSIS — Z5181 Encounter for therapeutic drug level monitoring: Secondary | ICD-10-CM | POA: Diagnosis not present

## 2019-06-05 DIAGNOSIS — G8929 Other chronic pain: Secondary | ICD-10-CM | POA: Diagnosis not present

## 2019-06-05 DIAGNOSIS — M545 Low back pain: Secondary | ICD-10-CM | POA: Diagnosis not present

## 2019-06-05 DIAGNOSIS — E039 Hypothyroidism, unspecified: Secondary | ICD-10-CM | POA: Diagnosis not present

## 2019-06-05 DIAGNOSIS — I10 Essential (primary) hypertension: Secondary | ICD-10-CM | POA: Diagnosis not present

## 2019-07-01 DIAGNOSIS — R1013 Epigastric pain: Secondary | ICD-10-CM | POA: Diagnosis not present

## 2019-07-08 DIAGNOSIS — M545 Low back pain: Secondary | ICD-10-CM | POA: Diagnosis not present

## 2019-07-08 DIAGNOSIS — E039 Hypothyroidism, unspecified: Secondary | ICD-10-CM | POA: Diagnosis not present

## 2019-07-08 DIAGNOSIS — G8929 Other chronic pain: Secondary | ICD-10-CM | POA: Diagnosis not present

## 2019-07-08 DIAGNOSIS — J45909 Unspecified asthma, uncomplicated: Secondary | ICD-10-CM | POA: Diagnosis not present

## 2019-07-08 DIAGNOSIS — I10 Essential (primary) hypertension: Secondary | ICD-10-CM | POA: Diagnosis not present

## 2019-07-28 DIAGNOSIS — E7800 Pure hypercholesterolemia, unspecified: Secondary | ICD-10-CM | POA: Diagnosis not present

## 2019-07-28 DIAGNOSIS — M25511 Pain in right shoulder: Secondary | ICD-10-CM | POA: Diagnosis not present

## 2019-07-28 DIAGNOSIS — Z23 Encounter for immunization: Secondary | ICD-10-CM | POA: Diagnosis not present

## 2019-07-28 DIAGNOSIS — Z1389 Encounter for screening for other disorder: Secondary | ICD-10-CM | POA: Diagnosis not present

## 2019-07-28 DIAGNOSIS — I10 Essential (primary) hypertension: Secondary | ICD-10-CM | POA: Diagnosis not present

## 2019-07-28 DIAGNOSIS — Z0000 Encounter for general adult medical examination without abnormal findings: Secondary | ICD-10-CM | POA: Diagnosis not present

## 2019-08-05 DIAGNOSIS — I10 Essential (primary) hypertension: Secondary | ICD-10-CM | POA: Diagnosis not present

## 2019-08-05 DIAGNOSIS — Z5181 Encounter for therapeutic drug level monitoring: Secondary | ICD-10-CM | POA: Diagnosis not present

## 2019-08-05 DIAGNOSIS — M545 Low back pain: Secondary | ICD-10-CM | POA: Diagnosis not present

## 2019-08-05 DIAGNOSIS — G8929 Other chronic pain: Secondary | ICD-10-CM | POA: Diagnosis not present

## 2019-08-05 DIAGNOSIS — M25511 Pain in right shoulder: Secondary | ICD-10-CM | POA: Diagnosis not present

## 2019-08-14 DIAGNOSIS — M67911 Unspecified disorder of synovium and tendon, right shoulder: Secondary | ICD-10-CM | POA: Diagnosis not present

## 2019-09-04 DIAGNOSIS — R7303 Prediabetes: Secondary | ICD-10-CM | POA: Diagnosis not present

## 2019-09-04 DIAGNOSIS — I10 Essential (primary) hypertension: Secondary | ICD-10-CM | POA: Diagnosis not present

## 2019-09-04 DIAGNOSIS — Z5181 Encounter for therapeutic drug level monitoring: Secondary | ICD-10-CM | POA: Diagnosis not present

## 2019-09-04 DIAGNOSIS — M545 Low back pain: Secondary | ICD-10-CM | POA: Diagnosis not present

## 2019-09-04 DIAGNOSIS — G8929 Other chronic pain: Secondary | ICD-10-CM | POA: Diagnosis not present

## 2019-09-09 DIAGNOSIS — M67911 Unspecified disorder of synovium and tendon, right shoulder: Secondary | ICD-10-CM | POA: Diagnosis not present

## 2019-09-15 DIAGNOSIS — M25511 Pain in right shoulder: Secondary | ICD-10-CM | POA: Diagnosis not present

## 2019-09-21 DIAGNOSIS — M75111 Incomplete rotator cuff tear or rupture of right shoulder, not specified as traumatic: Secondary | ICD-10-CM | POA: Diagnosis not present

## 2019-09-30 DIAGNOSIS — Z79899 Other long term (current) drug therapy: Secondary | ICD-10-CM | POA: Diagnosis not present

## 2019-09-30 DIAGNOSIS — M25511 Pain in right shoulder: Secondary | ICD-10-CM | POA: Diagnosis not present

## 2019-09-30 DIAGNOSIS — G894 Chronic pain syndrome: Secondary | ICD-10-CM | POA: Diagnosis not present

## 2019-09-30 DIAGNOSIS — Z5181 Encounter for therapeutic drug level monitoring: Secondary | ICD-10-CM | POA: Diagnosis not present

## 2019-10-08 DIAGNOSIS — M25511 Pain in right shoulder: Secondary | ICD-10-CM | POA: Diagnosis not present

## 2019-10-08 DIAGNOSIS — M67921 Unspecified disorder of synovium and tendon, right upper arm: Secondary | ICD-10-CM | POA: Diagnosis not present

## 2019-11-02 DIAGNOSIS — R7303 Prediabetes: Secondary | ICD-10-CM | POA: Diagnosis not present

## 2019-11-02 DIAGNOSIS — I10 Essential (primary) hypertension: Secondary | ICD-10-CM | POA: Diagnosis not present

## 2019-11-02 DIAGNOSIS — M545 Low back pain: Secondary | ICD-10-CM | POA: Diagnosis not present

## 2019-11-02 DIAGNOSIS — Z5181 Encounter for therapeutic drug level monitoring: Secondary | ICD-10-CM | POA: Diagnosis not present

## 2019-11-02 DIAGNOSIS — G8929 Other chronic pain: Secondary | ICD-10-CM | POA: Diagnosis not present

## 2019-12-03 DIAGNOSIS — Z5181 Encounter for therapeutic drug level monitoring: Secondary | ICD-10-CM | POA: Diagnosis not present

## 2019-12-03 DIAGNOSIS — Z79899 Other long term (current) drug therapy: Secondary | ICD-10-CM | POA: Diagnosis not present

## 2019-12-03 DIAGNOSIS — G894 Chronic pain syndrome: Secondary | ICD-10-CM | POA: Diagnosis not present

## 2019-12-31 DIAGNOSIS — G894 Chronic pain syndrome: Secondary | ICD-10-CM | POA: Diagnosis not present

## 2019-12-31 DIAGNOSIS — Z5181 Encounter for therapeutic drug level monitoring: Secondary | ICD-10-CM | POA: Diagnosis not present

## 2019-12-31 DIAGNOSIS — K760 Fatty (change of) liver, not elsewhere classified: Secondary | ICD-10-CM | POA: Diagnosis not present

## 2019-12-31 DIAGNOSIS — K861 Other chronic pancreatitis: Secondary | ICD-10-CM | POA: Diagnosis not present

## 2019-12-31 DIAGNOSIS — E039 Hypothyroidism, unspecified: Secondary | ICD-10-CM | POA: Diagnosis not present

## 2020-01-01 DIAGNOSIS — H5213 Myopia, bilateral: Secondary | ICD-10-CM | POA: Diagnosis not present

## 2020-01-11 DIAGNOSIS — K859 Acute pancreatitis without necrosis or infection, unspecified: Secondary | ICD-10-CM | POA: Diagnosis not present

## 2020-01-11 DIAGNOSIS — K861 Other chronic pancreatitis: Secondary | ICD-10-CM | POA: Diagnosis not present

## 2020-02-01 DIAGNOSIS — Z79899 Other long term (current) drug therapy: Secondary | ICD-10-CM | POA: Diagnosis not present

## 2020-02-01 DIAGNOSIS — Z5181 Encounter for therapeutic drug level monitoring: Secondary | ICD-10-CM | POA: Diagnosis not present

## 2020-02-01 DIAGNOSIS — G894 Chronic pain syndrome: Secondary | ICD-10-CM | POA: Diagnosis not present

## 2020-02-12 DIAGNOSIS — H524 Presbyopia: Secondary | ICD-10-CM | POA: Diagnosis not present

## 2020-03-01 DIAGNOSIS — Z5181 Encounter for therapeutic drug level monitoring: Secondary | ICD-10-CM | POA: Diagnosis not present

## 2020-03-01 DIAGNOSIS — G894 Chronic pain syndrome: Secondary | ICD-10-CM | POA: Diagnosis not present

## 2020-03-01 DIAGNOSIS — Z79899 Other long term (current) drug therapy: Secondary | ICD-10-CM | POA: Diagnosis not present

## 2020-04-01 DIAGNOSIS — G894 Chronic pain syndrome: Secondary | ICD-10-CM | POA: Diagnosis not present

## 2020-04-01 DIAGNOSIS — R739 Hyperglycemia, unspecified: Secondary | ICD-10-CM | POA: Diagnosis not present

## 2020-04-01 DIAGNOSIS — Z5181 Encounter for therapeutic drug level monitoring: Secondary | ICD-10-CM | POA: Diagnosis not present

## 2020-05-02 DIAGNOSIS — Z5181 Encounter for therapeutic drug level monitoring: Secondary | ICD-10-CM | POA: Diagnosis not present

## 2020-05-02 DIAGNOSIS — G894 Chronic pain syndrome: Secondary | ICD-10-CM | POA: Diagnosis not present

## 2020-05-02 DIAGNOSIS — I10 Essential (primary) hypertension: Secondary | ICD-10-CM | POA: Diagnosis not present

## 2020-05-26 DIAGNOSIS — M79675 Pain in left toe(s): Secondary | ICD-10-CM | POA: Diagnosis not present

## 2020-05-26 DIAGNOSIS — R251 Tremor, unspecified: Secondary | ICD-10-CM | POA: Diagnosis not present

## 2020-05-31 DIAGNOSIS — M109 Gout, unspecified: Secondary | ICD-10-CM | POA: Diagnosis not present

## 2020-05-31 DIAGNOSIS — Z5181 Encounter for therapeutic drug level monitoring: Secondary | ICD-10-CM | POA: Diagnosis not present

## 2020-05-31 DIAGNOSIS — G894 Chronic pain syndrome: Secondary | ICD-10-CM | POA: Diagnosis not present

## 2020-06-20 DIAGNOSIS — M25511 Pain in right shoulder: Secondary | ICD-10-CM | POA: Diagnosis not present

## 2020-06-25 DIAGNOSIS — M546 Pain in thoracic spine: Secondary | ICD-10-CM | POA: Diagnosis not present

## 2020-06-25 DIAGNOSIS — Z20822 Contact with and (suspected) exposure to covid-19: Secondary | ICD-10-CM | POA: Diagnosis not present

## 2020-06-25 DIAGNOSIS — J189 Pneumonia, unspecified organism: Secondary | ICD-10-CM | POA: Diagnosis not present

## 2020-06-25 DIAGNOSIS — R079 Chest pain, unspecified: Secondary | ICD-10-CM | POA: Diagnosis not present

## 2020-07-01 DIAGNOSIS — E039 Hypothyroidism, unspecified: Secondary | ICD-10-CM | POA: Diagnosis not present

## 2020-07-01 DIAGNOSIS — G894 Chronic pain syndrome: Secondary | ICD-10-CM | POA: Diagnosis not present

## 2020-07-01 DIAGNOSIS — J189 Pneumonia, unspecified organism: Secondary | ICD-10-CM | POA: Diagnosis not present

## 2020-07-01 DIAGNOSIS — Z780 Asymptomatic menopausal state: Secondary | ICD-10-CM | POA: Diagnosis not present

## 2020-07-20 DIAGNOSIS — R05 Cough: Secondary | ICD-10-CM | POA: Diagnosis not present

## 2020-07-20 DIAGNOSIS — Z20822 Contact with and (suspected) exposure to covid-19: Secondary | ICD-10-CM | POA: Diagnosis not present

## 2020-07-20 DIAGNOSIS — Z1152 Encounter for screening for COVID-19: Secondary | ICD-10-CM | POA: Diagnosis not present

## 2020-08-01 DIAGNOSIS — Z1212 Encounter for screening for malignant neoplasm of rectum: Secondary | ICD-10-CM | POA: Diagnosis not present

## 2020-08-01 DIAGNOSIS — Z1211 Encounter for screening for malignant neoplasm of colon: Secondary | ICD-10-CM | POA: Diagnosis not present

## 2020-08-02 DIAGNOSIS — Z5181 Encounter for therapeutic drug level monitoring: Secondary | ICD-10-CM | POA: Diagnosis not present

## 2020-08-02 DIAGNOSIS — Z23 Encounter for immunization: Secondary | ICD-10-CM | POA: Diagnosis not present

## 2020-08-02 DIAGNOSIS — E785 Hyperlipidemia, unspecified: Secondary | ICD-10-CM | POA: Diagnosis not present

## 2020-08-02 DIAGNOSIS — R7303 Prediabetes: Secondary | ICD-10-CM | POA: Diagnosis not present

## 2020-08-02 DIAGNOSIS — Z1389 Encounter for screening for other disorder: Secondary | ICD-10-CM | POA: Diagnosis not present

## 2020-08-02 DIAGNOSIS — I10 Essential (primary) hypertension: Secondary | ICD-10-CM | POA: Diagnosis not present

## 2020-08-02 DIAGNOSIS — Z0000 Encounter for general adult medical examination without abnormal findings: Secondary | ICD-10-CM | POA: Diagnosis not present

## 2020-08-02 DIAGNOSIS — E039 Hypothyroidism, unspecified: Secondary | ICD-10-CM | POA: Diagnosis not present

## 2020-08-03 DIAGNOSIS — H02401 Unspecified ptosis of right eyelid: Secondary | ICD-10-CM | POA: Diagnosis not present

## 2020-08-04 DIAGNOSIS — Z1231 Encounter for screening mammogram for malignant neoplasm of breast: Secondary | ICD-10-CM | POA: Diagnosis not present

## 2020-08-08 LAB — COLOGUARD: COLOGUARD: NEGATIVE

## 2020-08-27 DIAGNOSIS — R0781 Pleurodynia: Secondary | ICD-10-CM | POA: Diagnosis not present

## 2020-08-27 DIAGNOSIS — Z20822 Contact with and (suspected) exposure to covid-19: Secondary | ICD-10-CM | POA: Diagnosis not present

## 2020-08-27 DIAGNOSIS — Z981 Arthrodesis status: Secondary | ICD-10-CM | POA: Diagnosis not present

## 2020-09-02 DIAGNOSIS — E782 Mixed hyperlipidemia: Secondary | ICD-10-CM | POA: Diagnosis not present

## 2020-09-02 DIAGNOSIS — G894 Chronic pain syndrome: Secondary | ICD-10-CM | POA: Diagnosis not present

## 2020-09-02 DIAGNOSIS — Z5181 Encounter for therapeutic drug level monitoring: Secondary | ICD-10-CM | POA: Diagnosis not present

## 2020-10-03 DIAGNOSIS — G8929 Other chronic pain: Secondary | ICD-10-CM | POA: Diagnosis not present

## 2020-10-03 DIAGNOSIS — M5441 Lumbago with sciatica, right side: Secondary | ICD-10-CM | POA: Diagnosis not present

## 2020-11-01 DIAGNOSIS — Z79899 Other long term (current) drug therapy: Secondary | ICD-10-CM | POA: Diagnosis not present

## 2020-11-01 DIAGNOSIS — Z5181 Encounter for therapeutic drug level monitoring: Secondary | ICD-10-CM | POA: Diagnosis not present

## 2020-11-01 DIAGNOSIS — G894 Chronic pain syndrome: Secondary | ICD-10-CM | POA: Diagnosis not present

## 2020-11-29 DIAGNOSIS — Z79899 Other long term (current) drug therapy: Secondary | ICD-10-CM | POA: Diagnosis not present

## 2020-11-29 DIAGNOSIS — G894 Chronic pain syndrome: Secondary | ICD-10-CM | POA: Diagnosis not present

## 2020-11-29 DIAGNOSIS — E782 Mixed hyperlipidemia: Secondary | ICD-10-CM | POA: Diagnosis not present

## 2020-11-29 DIAGNOSIS — E039 Hypothyroidism, unspecified: Secondary | ICD-10-CM | POA: Diagnosis not present

## 2020-11-29 DIAGNOSIS — E1169 Type 2 diabetes mellitus with other specified complication: Secondary | ICD-10-CM | POA: Diagnosis not present

## 2020-11-29 DIAGNOSIS — Z5181 Encounter for therapeutic drug level monitoring: Secondary | ICD-10-CM | POA: Diagnosis not present

## 2020-12-29 DIAGNOSIS — Z79899 Other long term (current) drug therapy: Secondary | ICD-10-CM | POA: Diagnosis not present

## 2020-12-29 DIAGNOSIS — J301 Allergic rhinitis due to pollen: Secondary | ICD-10-CM | POA: Diagnosis not present

## 2020-12-29 DIAGNOSIS — Z5181 Encounter for therapeutic drug level monitoring: Secondary | ICD-10-CM | POA: Diagnosis not present

## 2020-12-29 DIAGNOSIS — E782 Mixed hyperlipidemia: Secondary | ICD-10-CM | POA: Diagnosis not present

## 2020-12-29 DIAGNOSIS — G894 Chronic pain syndrome: Secondary | ICD-10-CM | POA: Diagnosis not present

## 2021-01-11 DIAGNOSIS — I10 Essential (primary) hypertension: Secondary | ICD-10-CM | POA: Diagnosis not present

## 2021-01-11 DIAGNOSIS — E785 Hyperlipidemia, unspecified: Secondary | ICD-10-CM | POA: Diagnosis not present

## 2021-02-28 DIAGNOSIS — E039 Hypothyroidism, unspecified: Secondary | ICD-10-CM | POA: Diagnosis not present

## 2021-02-28 DIAGNOSIS — R011 Cardiac murmur, unspecified: Secondary | ICD-10-CM | POA: Diagnosis not present

## 2021-02-28 DIAGNOSIS — Z79899 Other long term (current) drug therapy: Secondary | ICD-10-CM | POA: Diagnosis not present

## 2021-02-28 DIAGNOSIS — Z87891 Personal history of nicotine dependence: Secondary | ICD-10-CM | POA: Diagnosis not present

## 2021-02-28 DIAGNOSIS — G894 Chronic pain syndrome: Secondary | ICD-10-CM | POA: Diagnosis not present

## 2021-02-28 DIAGNOSIS — Z5181 Encounter for therapeutic drug level monitoring: Secondary | ICD-10-CM | POA: Diagnosis not present

## 2021-03-06 DIAGNOSIS — K219 Gastro-esophageal reflux disease without esophagitis: Secondary | ICD-10-CM | POA: Diagnosis not present

## 2021-03-06 DIAGNOSIS — R978 Other abnormal tumor markers: Secondary | ICD-10-CM | POA: Diagnosis not present

## 2021-03-06 DIAGNOSIS — K760 Fatty (change of) liver, not elsewhere classified: Secondary | ICD-10-CM | POA: Diagnosis not present

## 2021-05-14 DIAGNOSIS — R059 Cough, unspecified: Secondary | ICD-10-CM | POA: Diagnosis not present

## 2021-05-14 DIAGNOSIS — Z981 Arthrodesis status: Secondary | ICD-10-CM | POA: Diagnosis not present

## 2021-05-14 DIAGNOSIS — R058 Other specified cough: Secondary | ICD-10-CM | POA: Diagnosis not present

## 2021-05-14 DIAGNOSIS — R9389 Abnormal findings on diagnostic imaging of other specified body structures: Secondary | ICD-10-CM | POA: Diagnosis not present

## 2021-05-30 DIAGNOSIS — Z79899 Other long term (current) drug therapy: Secondary | ICD-10-CM | POA: Diagnosis not present

## 2021-05-30 DIAGNOSIS — E039 Hypothyroidism, unspecified: Secondary | ICD-10-CM | POA: Diagnosis not present

## 2021-05-30 DIAGNOSIS — G8929 Other chronic pain: Secondary | ICD-10-CM | POA: Diagnosis not present

## 2021-05-30 DIAGNOSIS — R7303 Prediabetes: Secondary | ICD-10-CM | POA: Diagnosis not present

## 2021-05-30 DIAGNOSIS — E781 Pure hyperglyceridemia: Secondary | ICD-10-CM | POA: Diagnosis not present

## 2021-05-30 DIAGNOSIS — Z5181 Encounter for therapeutic drug level monitoring: Secondary | ICD-10-CM | POA: Diagnosis not present

## 2021-06-13 DIAGNOSIS — E0590 Thyrotoxicosis, unspecified without thyrotoxic crisis or storm: Secondary | ICD-10-CM | POA: Diagnosis not present

## 2021-06-13 DIAGNOSIS — R7989 Other specified abnormal findings of blood chemistry: Secondary | ICD-10-CM | POA: Diagnosis not present

## 2021-06-22 DIAGNOSIS — Z79899 Other long term (current) drug therapy: Secondary | ICD-10-CM | POA: Diagnosis not present

## 2021-06-22 DIAGNOSIS — M545 Low back pain, unspecified: Secondary | ICD-10-CM | POA: Diagnosis not present

## 2021-06-22 DIAGNOSIS — M25511 Pain in right shoulder: Secondary | ICD-10-CM | POA: Diagnosis not present

## 2021-06-22 DIAGNOSIS — G894 Chronic pain syndrome: Secondary | ICD-10-CM | POA: Diagnosis not present

## 2021-06-22 DIAGNOSIS — Z1389 Encounter for screening for other disorder: Secondary | ICD-10-CM | POA: Diagnosis not present

## 2021-06-22 DIAGNOSIS — M19011 Primary osteoarthritis, right shoulder: Secondary | ICD-10-CM | POA: Diagnosis not present

## 2021-07-13 DIAGNOSIS — G894 Chronic pain syndrome: Secondary | ICD-10-CM | POA: Diagnosis not present

## 2021-07-13 DIAGNOSIS — M25511 Pain in right shoulder: Secondary | ICD-10-CM | POA: Diagnosis not present

## 2021-07-13 DIAGNOSIS — Z79899 Other long term (current) drug therapy: Secondary | ICD-10-CM | POA: Diagnosis not present

## 2021-07-13 DIAGNOSIS — M19011 Primary osteoarthritis, right shoulder: Secondary | ICD-10-CM | POA: Diagnosis not present

## 2021-07-26 DIAGNOSIS — E0590 Thyrotoxicosis, unspecified without thyrotoxic crisis or storm: Secondary | ICD-10-CM | POA: Diagnosis not present

## 2021-07-26 DIAGNOSIS — R7989 Other specified abnormal findings of blood chemistry: Secondary | ICD-10-CM | POA: Diagnosis not present

## 2021-08-10 DIAGNOSIS — Z79899 Other long term (current) drug therapy: Secondary | ICD-10-CM | POA: Diagnosis not present

## 2021-08-10 DIAGNOSIS — M19011 Primary osteoarthritis, right shoulder: Secondary | ICD-10-CM | POA: Diagnosis not present

## 2021-08-10 DIAGNOSIS — G894 Chronic pain syndrome: Secondary | ICD-10-CM | POA: Diagnosis not present

## 2021-08-10 DIAGNOSIS — M545 Low back pain, unspecified: Secondary | ICD-10-CM | POA: Diagnosis not present

## 2021-08-10 DIAGNOSIS — M25511 Pain in right shoulder: Secondary | ICD-10-CM | POA: Diagnosis not present

## 2021-08-17 DIAGNOSIS — R918 Other nonspecific abnormal finding of lung field: Secondary | ICD-10-CM | POA: Diagnosis not present

## 2021-08-17 DIAGNOSIS — R079 Chest pain, unspecified: Secondary | ICD-10-CM | POA: Diagnosis not present

## 2021-08-17 DIAGNOSIS — R071 Chest pain on breathing: Secondary | ICD-10-CM | POA: Diagnosis not present

## 2021-08-17 DIAGNOSIS — R0789 Other chest pain: Secondary | ICD-10-CM | POA: Diagnosis not present

## 2021-08-24 DIAGNOSIS — F32A Depression, unspecified: Secondary | ICD-10-CM | POA: Diagnosis not present

## 2021-08-24 DIAGNOSIS — M109 Gout, unspecified: Secondary | ICD-10-CM | POA: Diagnosis not present

## 2021-08-24 DIAGNOSIS — I10 Essential (primary) hypertension: Secondary | ICD-10-CM | POA: Diagnosis not present

## 2021-08-24 DIAGNOSIS — E782 Mixed hyperlipidemia: Secondary | ICD-10-CM | POA: Diagnosis not present

## 2021-08-24 DIAGNOSIS — Z0000 Encounter for general adult medical examination without abnormal findings: Secondary | ICD-10-CM | POA: Diagnosis not present

## 2021-08-24 DIAGNOSIS — E039 Hypothyroidism, unspecified: Secondary | ICD-10-CM | POA: Diagnosis not present

## 2021-08-24 DIAGNOSIS — E1169 Type 2 diabetes mellitus with other specified complication: Secondary | ICD-10-CM | POA: Diagnosis not present

## 2021-08-24 DIAGNOSIS — K760 Fatty (change of) liver, not elsewhere classified: Secondary | ICD-10-CM | POA: Diagnosis not present

## 2021-09-07 DIAGNOSIS — M545 Low back pain, unspecified: Secondary | ICD-10-CM | POA: Diagnosis not present

## 2021-09-07 DIAGNOSIS — G894 Chronic pain syndrome: Secondary | ICD-10-CM | POA: Diagnosis not present

## 2021-09-07 DIAGNOSIS — M25511 Pain in right shoulder: Secondary | ICD-10-CM | POA: Diagnosis not present

## 2021-09-07 DIAGNOSIS — Z79891 Long term (current) use of opiate analgesic: Secondary | ICD-10-CM | POA: Diagnosis not present

## 2021-09-07 DIAGNOSIS — M19011 Primary osteoarthritis, right shoulder: Secondary | ICD-10-CM | POA: Diagnosis not present

## 2021-09-18 DIAGNOSIS — J479 Bronchiectasis, uncomplicated: Secondary | ICD-10-CM | POA: Diagnosis not present

## 2021-10-05 DIAGNOSIS — Z79899 Other long term (current) drug therapy: Secondary | ICD-10-CM | POA: Diagnosis not present

## 2021-10-05 DIAGNOSIS — M545 Low back pain, unspecified: Secondary | ICD-10-CM | POA: Diagnosis not present

## 2021-10-05 DIAGNOSIS — G894 Chronic pain syndrome: Secondary | ICD-10-CM | POA: Diagnosis not present

## 2021-10-05 DIAGNOSIS — M19011 Primary osteoarthritis, right shoulder: Secondary | ICD-10-CM | POA: Diagnosis not present

## 2021-10-05 DIAGNOSIS — M25511 Pain in right shoulder: Secondary | ICD-10-CM | POA: Diagnosis not present

## 2021-10-16 DIAGNOSIS — J40 Bronchitis, not specified as acute or chronic: Secondary | ICD-10-CM | POA: Diagnosis not present

## 2021-10-16 DIAGNOSIS — R21 Rash and other nonspecific skin eruption: Secondary | ICD-10-CM | POA: Diagnosis not present

## 2021-10-16 DIAGNOSIS — M25511 Pain in right shoulder: Secondary | ICD-10-CM | POA: Diagnosis not present

## 2021-10-20 DIAGNOSIS — M25511 Pain in right shoulder: Secondary | ICD-10-CM | POA: Diagnosis not present

## 2021-11-23 DIAGNOSIS — M19011 Primary osteoarthritis, right shoulder: Secondary | ICD-10-CM | POA: Diagnosis not present

## 2021-11-23 DIAGNOSIS — G894 Chronic pain syndrome: Secondary | ICD-10-CM | POA: Diagnosis not present

## 2021-11-23 DIAGNOSIS — M25511 Pain in right shoulder: Secondary | ICD-10-CM | POA: Diagnosis not present

## 2021-11-23 DIAGNOSIS — M545 Low back pain, unspecified: Secondary | ICD-10-CM | POA: Diagnosis not present

## 2021-11-23 DIAGNOSIS — Z79899 Other long term (current) drug therapy: Secondary | ICD-10-CM | POA: Diagnosis not present

## 2021-11-27 DIAGNOSIS — E039 Hypothyroidism, unspecified: Secondary | ICD-10-CM | POA: Diagnosis not present

## 2021-11-27 DIAGNOSIS — G8929 Other chronic pain: Secondary | ICD-10-CM | POA: Diagnosis not present

## 2021-11-27 DIAGNOSIS — E1169 Type 2 diabetes mellitus with other specified complication: Secondary | ICD-10-CM | POA: Diagnosis not present

## 2021-11-27 DIAGNOSIS — E782 Mixed hyperlipidemia: Secondary | ICD-10-CM | POA: Diagnosis not present

## 2021-12-07 DIAGNOSIS — M545 Low back pain, unspecified: Secondary | ICD-10-CM | POA: Diagnosis not present

## 2021-12-07 DIAGNOSIS — M47812 Spondylosis without myelopathy or radiculopathy, cervical region: Secondary | ICD-10-CM | POA: Diagnosis not present

## 2021-12-07 DIAGNOSIS — M47814 Spondylosis without myelopathy or radiculopathy, thoracic region: Secondary | ICD-10-CM | POA: Diagnosis not present

## 2021-12-07 DIAGNOSIS — M546 Pain in thoracic spine: Secondary | ICD-10-CM | POA: Diagnosis not present

## 2021-12-07 DIAGNOSIS — Z981 Arthrodesis status: Secondary | ICD-10-CM | POA: Diagnosis not present

## 2021-12-07 DIAGNOSIS — M4602 Spinal enthesopathy, cervical region: Secondary | ICD-10-CM | POA: Diagnosis not present

## 2021-12-14 DIAGNOSIS — M62830 Muscle spasm of back: Secondary | ICD-10-CM | POA: Diagnosis not present

## 2021-12-14 DIAGNOSIS — M25511 Pain in right shoulder: Secondary | ICD-10-CM | POA: Diagnosis not present

## 2021-12-14 DIAGNOSIS — M19011 Primary osteoarthritis, right shoulder: Secondary | ICD-10-CM | POA: Diagnosis not present

## 2021-12-14 DIAGNOSIS — M542 Cervicalgia: Secondary | ICD-10-CM | POA: Diagnosis not present

## 2021-12-14 DIAGNOSIS — M545 Low back pain, unspecified: Secondary | ICD-10-CM | POA: Diagnosis not present

## 2021-12-14 DIAGNOSIS — Z79891 Long term (current) use of opiate analgesic: Secondary | ICD-10-CM | POA: Diagnosis not present

## 2021-12-14 DIAGNOSIS — G894 Chronic pain syndrome: Secondary | ICD-10-CM | POA: Diagnosis not present

## 2021-12-29 DIAGNOSIS — R062 Wheezing: Secondary | ICD-10-CM | POA: Diagnosis not present

## 2021-12-29 DIAGNOSIS — R059 Cough, unspecified: Secondary | ICD-10-CM | POA: Diagnosis not present

## 2022-01-11 DIAGNOSIS — R062 Wheezing: Secondary | ICD-10-CM | POA: Diagnosis not present

## 2022-01-11 DIAGNOSIS — J479 Bronchiectasis, uncomplicated: Secondary | ICD-10-CM | POA: Diagnosis not present

## 2022-01-11 DIAGNOSIS — J9811 Atelectasis: Secondary | ICD-10-CM | POA: Diagnosis not present

## 2022-01-11 DIAGNOSIS — R059 Cough, unspecified: Secondary | ICD-10-CM | POA: Diagnosis not present

## 2022-01-11 DIAGNOSIS — M19011 Primary osteoarthritis, right shoulder: Secondary | ICD-10-CM | POA: Diagnosis not present

## 2022-01-11 DIAGNOSIS — M542 Cervicalgia: Secondary | ICD-10-CM | POA: Diagnosis not present

## 2022-01-11 DIAGNOSIS — Z79899 Other long term (current) drug therapy: Secondary | ICD-10-CM | POA: Diagnosis not present

## 2022-01-11 DIAGNOSIS — M545 Low back pain, unspecified: Secondary | ICD-10-CM | POA: Diagnosis not present

## 2022-01-11 DIAGNOSIS — M25511 Pain in right shoulder: Secondary | ICD-10-CM | POA: Diagnosis not present

## 2022-01-11 DIAGNOSIS — G894 Chronic pain syndrome: Secondary | ICD-10-CM | POA: Diagnosis not present

## 2022-01-11 DIAGNOSIS — Z79891 Long term (current) use of opiate analgesic: Secondary | ICD-10-CM | POA: Diagnosis not present

## 2022-01-11 DIAGNOSIS — R918 Other nonspecific abnormal finding of lung field: Secondary | ICD-10-CM | POA: Diagnosis not present

## 2022-01-11 DIAGNOSIS — R911 Solitary pulmonary nodule: Secondary | ICD-10-CM | POA: Diagnosis not present

## 2022-01-18 DIAGNOSIS — Z87891 Personal history of nicotine dependence: Secondary | ICD-10-CM | POA: Diagnosis not present

## 2022-01-18 DIAGNOSIS — R911 Solitary pulmonary nodule: Secondary | ICD-10-CM | POA: Diagnosis not present

## 2022-01-18 DIAGNOSIS — J4530 Mild persistent asthma, uncomplicated: Secondary | ICD-10-CM | POA: Diagnosis not present

## 2022-01-18 DIAGNOSIS — R918 Other nonspecific abnormal finding of lung field: Secondary | ICD-10-CM | POA: Diagnosis not present

## 2022-01-18 DIAGNOSIS — J479 Bronchiectasis, uncomplicated: Secondary | ICD-10-CM | POA: Diagnosis not present

## 2022-02-08 DIAGNOSIS — G894 Chronic pain syndrome: Secondary | ICD-10-CM | POA: Diagnosis not present

## 2022-02-08 DIAGNOSIS — M542 Cervicalgia: Secondary | ICD-10-CM | POA: Diagnosis not present

## 2022-02-08 DIAGNOSIS — M19011 Primary osteoarthritis, right shoulder: Secondary | ICD-10-CM | POA: Diagnosis not present

## 2022-02-08 DIAGNOSIS — M25511 Pain in right shoulder: Secondary | ICD-10-CM | POA: Diagnosis not present

## 2022-02-08 DIAGNOSIS — Z79891 Long term (current) use of opiate analgesic: Secondary | ICD-10-CM | POA: Diagnosis not present

## 2022-02-08 DIAGNOSIS — M62830 Muscle spasm of back: Secondary | ICD-10-CM | POA: Diagnosis not present

## 2022-02-08 DIAGNOSIS — M545 Low back pain, unspecified: Secondary | ICD-10-CM | POA: Diagnosis not present

## 2022-02-26 DIAGNOSIS — R29898 Other symptoms and signs involving the musculoskeletal system: Secondary | ICD-10-CM | POA: Diagnosis not present

## 2022-02-26 DIAGNOSIS — E1169 Type 2 diabetes mellitus with other specified complication: Secondary | ICD-10-CM | POA: Diagnosis not present

## 2022-02-26 DIAGNOSIS — M549 Dorsalgia, unspecified: Secondary | ICD-10-CM | POA: Diagnosis not present

## 2022-02-26 DIAGNOSIS — G8929 Other chronic pain: Secondary | ICD-10-CM | POA: Diagnosis not present

## 2022-02-26 DIAGNOSIS — R21 Rash and other nonspecific skin eruption: Secondary | ICD-10-CM | POA: Diagnosis not present

## 2022-02-26 DIAGNOSIS — J310 Chronic rhinitis: Secondary | ICD-10-CM | POA: Diagnosis not present

## 2022-03-01 DIAGNOSIS — R911 Solitary pulmonary nodule: Secondary | ICD-10-CM | POA: Diagnosis not present

## 2022-03-01 DIAGNOSIS — J479 Bronchiectasis, uncomplicated: Secondary | ICD-10-CM | POA: Diagnosis not present

## 2022-03-01 DIAGNOSIS — R918 Other nonspecific abnormal finding of lung field: Secondary | ICD-10-CM | POA: Diagnosis not present

## 2022-03-01 DIAGNOSIS — Z87891 Personal history of nicotine dependence: Secondary | ICD-10-CM | POA: Diagnosis not present

## 2022-03-01 DIAGNOSIS — J4530 Mild persistent asthma, uncomplicated: Secondary | ICD-10-CM | POA: Diagnosis not present

## 2022-03-08 DIAGNOSIS — M25511 Pain in right shoulder: Secondary | ICD-10-CM | POA: Diagnosis not present

## 2022-03-08 DIAGNOSIS — Z79891 Long term (current) use of opiate analgesic: Secondary | ICD-10-CM | POA: Diagnosis not present

## 2022-03-08 DIAGNOSIS — Z79899 Other long term (current) drug therapy: Secondary | ICD-10-CM | POA: Diagnosis not present

## 2022-03-08 DIAGNOSIS — M19011 Primary osteoarthritis, right shoulder: Secondary | ICD-10-CM | POA: Diagnosis not present

## 2022-03-08 DIAGNOSIS — M62830 Muscle spasm of back: Secondary | ICD-10-CM | POA: Diagnosis not present

## 2022-03-08 DIAGNOSIS — M545 Low back pain, unspecified: Secondary | ICD-10-CM | POA: Diagnosis not present

## 2022-03-08 DIAGNOSIS — G894 Chronic pain syndrome: Secondary | ICD-10-CM | POA: Diagnosis not present

## 2022-03-08 DIAGNOSIS — M542 Cervicalgia: Secondary | ICD-10-CM | POA: Diagnosis not present

## 2022-04-05 DIAGNOSIS — M25511 Pain in right shoulder: Secondary | ICD-10-CM | POA: Diagnosis not present

## 2022-04-05 DIAGNOSIS — M19011 Primary osteoarthritis, right shoulder: Secondary | ICD-10-CM | POA: Diagnosis not present

## 2022-04-05 DIAGNOSIS — Z79891 Long term (current) use of opiate analgesic: Secondary | ICD-10-CM | POA: Diagnosis not present

## 2022-04-05 DIAGNOSIS — Z79899 Other long term (current) drug therapy: Secondary | ICD-10-CM | POA: Diagnosis not present

## 2022-04-05 DIAGNOSIS — M545 Low back pain, unspecified: Secondary | ICD-10-CM | POA: Diagnosis not present

## 2022-04-05 DIAGNOSIS — G894 Chronic pain syndrome: Secondary | ICD-10-CM | POA: Diagnosis not present

## 2022-04-05 DIAGNOSIS — M62830 Muscle spasm of back: Secondary | ICD-10-CM | POA: Diagnosis not present

## 2022-05-03 DIAGNOSIS — M542 Cervicalgia: Secondary | ICD-10-CM | POA: Diagnosis not present

## 2022-05-03 DIAGNOSIS — Z1389 Encounter for screening for other disorder: Secondary | ICD-10-CM | POA: Diagnosis not present

## 2022-05-03 DIAGNOSIS — M545 Low back pain, unspecified: Secondary | ICD-10-CM | POA: Diagnosis not present

## 2022-05-03 DIAGNOSIS — M25511 Pain in right shoulder: Secondary | ICD-10-CM | POA: Diagnosis not present

## 2022-05-03 DIAGNOSIS — Z79899 Other long term (current) drug therapy: Secondary | ICD-10-CM | POA: Diagnosis not present

## 2022-05-03 DIAGNOSIS — M62830 Muscle spasm of back: Secondary | ICD-10-CM | POA: Diagnosis not present

## 2022-05-03 DIAGNOSIS — M19011 Primary osteoarthritis, right shoulder: Secondary | ICD-10-CM | POA: Diagnosis not present

## 2022-05-03 DIAGNOSIS — Z79891 Long term (current) use of opiate analgesic: Secondary | ICD-10-CM | POA: Diagnosis not present

## 2022-05-03 DIAGNOSIS — G894 Chronic pain syndrome: Secondary | ICD-10-CM | POA: Diagnosis not present

## 2022-05-15 DIAGNOSIS — R059 Cough, unspecified: Secondary | ICD-10-CM | POA: Diagnosis not present

## 2022-05-15 DIAGNOSIS — J449 Chronic obstructive pulmonary disease, unspecified: Secondary | ICD-10-CM | POA: Diagnosis not present

## 2022-05-29 DIAGNOSIS — E039 Hypothyroidism, unspecified: Secondary | ICD-10-CM | POA: Diagnosis not present

## 2022-05-29 DIAGNOSIS — E1169 Type 2 diabetes mellitus with other specified complication: Secondary | ICD-10-CM | POA: Diagnosis not present

## 2022-05-29 DIAGNOSIS — M25511 Pain in right shoulder: Secondary | ICD-10-CM | POA: Diagnosis not present

## 2022-06-07 DIAGNOSIS — M545 Low back pain, unspecified: Secondary | ICD-10-CM | POA: Diagnosis not present

## 2022-06-07 DIAGNOSIS — G894 Chronic pain syndrome: Secondary | ICD-10-CM | POA: Diagnosis not present

## 2022-06-07 DIAGNOSIS — M25511 Pain in right shoulder: Secondary | ICD-10-CM | POA: Diagnosis not present

## 2022-06-07 DIAGNOSIS — Z79891 Long term (current) use of opiate analgesic: Secondary | ICD-10-CM | POA: Diagnosis not present

## 2022-06-07 DIAGNOSIS — M62830 Muscle spasm of back: Secondary | ICD-10-CM | POA: Diagnosis not present

## 2022-06-07 DIAGNOSIS — Z79899 Other long term (current) drug therapy: Secondary | ICD-10-CM | POA: Diagnosis not present

## 2022-06-07 DIAGNOSIS — M19011 Primary osteoarthritis, right shoulder: Secondary | ICD-10-CM | POA: Diagnosis not present

## 2022-06-07 DIAGNOSIS — M542 Cervicalgia: Secondary | ICD-10-CM | POA: Diagnosis not present

## 2022-07-04 DIAGNOSIS — J4530 Mild persistent asthma, uncomplicated: Secondary | ICD-10-CM | POA: Diagnosis not present

## 2022-07-04 DIAGNOSIS — R911 Solitary pulmonary nodule: Secondary | ICD-10-CM | POA: Diagnosis not present

## 2022-07-04 DIAGNOSIS — R918 Other nonspecific abnormal finding of lung field: Secondary | ICD-10-CM | POA: Diagnosis not present

## 2022-07-11 DIAGNOSIS — I700 Atherosclerosis of aorta: Secondary | ICD-10-CM | POA: Diagnosis not present

## 2022-07-11 DIAGNOSIS — R911 Solitary pulmonary nodule: Secondary | ICD-10-CM | POA: Diagnosis not present

## 2022-07-12 DIAGNOSIS — R911 Solitary pulmonary nodule: Secondary | ICD-10-CM | POA: Diagnosis not present

## 2022-07-12 DIAGNOSIS — Z79891 Long term (current) use of opiate analgesic: Secondary | ICD-10-CM | POA: Diagnosis not present

## 2022-07-12 DIAGNOSIS — M62830 Muscle spasm of back: Secondary | ICD-10-CM | POA: Diagnosis not present

## 2022-07-12 DIAGNOSIS — Z79899 Other long term (current) drug therapy: Secondary | ICD-10-CM | POA: Diagnosis not present

## 2022-07-12 DIAGNOSIS — J479 Bronchiectasis, uncomplicated: Secondary | ICD-10-CM | POA: Diagnosis not present

## 2022-07-12 DIAGNOSIS — R918 Other nonspecific abnormal finding of lung field: Secondary | ICD-10-CM | POA: Diagnosis not present

## 2022-07-12 DIAGNOSIS — M19011 Primary osteoarthritis, right shoulder: Secondary | ICD-10-CM | POA: Diagnosis not present

## 2022-07-12 DIAGNOSIS — J4530 Mild persistent asthma, uncomplicated: Secondary | ICD-10-CM | POA: Diagnosis not present

## 2022-07-12 DIAGNOSIS — G894 Chronic pain syndrome: Secondary | ICD-10-CM | POA: Diagnosis not present

## 2022-07-12 DIAGNOSIS — M545 Low back pain, unspecified: Secondary | ICD-10-CM | POA: Diagnosis not present

## 2022-07-12 DIAGNOSIS — M25511 Pain in right shoulder: Secondary | ICD-10-CM | POA: Diagnosis not present

## 2022-07-12 DIAGNOSIS — Z87891 Personal history of nicotine dependence: Secondary | ICD-10-CM | POA: Diagnosis not present

## 2022-08-02 DIAGNOSIS — J4530 Mild persistent asthma, uncomplicated: Secondary | ICD-10-CM | POA: Diagnosis not present

## 2022-08-02 DIAGNOSIS — Z87891 Personal history of nicotine dependence: Secondary | ICD-10-CM | POA: Diagnosis not present

## 2022-08-02 DIAGNOSIS — I2510 Atherosclerotic heart disease of native coronary artery without angina pectoris: Secondary | ICD-10-CM | POA: Diagnosis not present

## 2022-08-02 DIAGNOSIS — R918 Other nonspecific abnormal finding of lung field: Secondary | ICD-10-CM | POA: Diagnosis not present

## 2022-08-02 DIAGNOSIS — J479 Bronchiectasis, uncomplicated: Secondary | ICD-10-CM | POA: Diagnosis not present

## 2022-08-16 DIAGNOSIS — G894 Chronic pain syndrome: Secondary | ICD-10-CM | POA: Diagnosis not present

## 2022-08-16 DIAGNOSIS — M19011 Primary osteoarthritis, right shoulder: Secondary | ICD-10-CM | POA: Diagnosis not present

## 2022-08-16 DIAGNOSIS — M25511 Pain in right shoulder: Secondary | ICD-10-CM | POA: Diagnosis not present

## 2022-08-16 DIAGNOSIS — M545 Low back pain, unspecified: Secondary | ICD-10-CM | POA: Diagnosis not present

## 2022-08-16 DIAGNOSIS — M542 Cervicalgia: Secondary | ICD-10-CM | POA: Diagnosis not present

## 2022-08-16 DIAGNOSIS — Z79891 Long term (current) use of opiate analgesic: Secondary | ICD-10-CM | POA: Diagnosis not present

## 2022-08-22 DIAGNOSIS — R0600 Dyspnea, unspecified: Secondary | ICD-10-CM | POA: Diagnosis not present

## 2022-08-28 DIAGNOSIS — K219 Gastro-esophageal reflux disease without esophagitis: Secondary | ICD-10-CM | POA: Diagnosis not present

## 2022-08-28 DIAGNOSIS — K760 Fatty (change of) liver, not elsewhere classified: Secondary | ICD-10-CM | POA: Diagnosis not present

## 2022-08-28 DIAGNOSIS — J309 Allergic rhinitis, unspecified: Secondary | ICD-10-CM | POA: Diagnosis not present

## 2022-08-28 DIAGNOSIS — Z0000 Encounter for general adult medical examination without abnormal findings: Secondary | ICD-10-CM | POA: Diagnosis not present

## 2022-08-28 DIAGNOSIS — J449 Chronic obstructive pulmonary disease, unspecified: Secondary | ICD-10-CM | POA: Diagnosis not present

## 2022-08-28 DIAGNOSIS — E039 Hypothyroidism, unspecified: Secondary | ICD-10-CM | POA: Diagnosis not present

## 2022-08-28 DIAGNOSIS — E782 Mixed hyperlipidemia: Secondary | ICD-10-CM | POA: Diagnosis not present

## 2022-08-28 DIAGNOSIS — I10 Essential (primary) hypertension: Secondary | ICD-10-CM | POA: Diagnosis not present

## 2022-08-28 DIAGNOSIS — E1169 Type 2 diabetes mellitus with other specified complication: Secondary | ICD-10-CM | POA: Diagnosis not present

## 2022-08-28 DIAGNOSIS — G894 Chronic pain syndrome: Secondary | ICD-10-CM | POA: Diagnosis not present

## 2022-08-30 DIAGNOSIS — R911 Solitary pulmonary nodule: Secondary | ICD-10-CM | POA: Diagnosis not present

## 2022-08-30 DIAGNOSIS — Z7901 Long term (current) use of anticoagulants: Secondary | ICD-10-CM | POA: Diagnosis not present

## 2022-08-30 DIAGNOSIS — R918 Other nonspecific abnormal finding of lung field: Secondary | ICD-10-CM | POA: Diagnosis not present

## 2022-08-30 DIAGNOSIS — Z01818 Encounter for other preprocedural examination: Secondary | ICD-10-CM | POA: Diagnosis not present

## 2022-08-31 DIAGNOSIS — Z87891 Personal history of nicotine dependence: Secondary | ICD-10-CM | POA: Diagnosis not present

## 2022-08-31 DIAGNOSIS — E119 Type 2 diabetes mellitus without complications: Secondary | ICD-10-CM | POA: Diagnosis not present

## 2022-08-31 DIAGNOSIS — D72829 Elevated white blood cell count, unspecified: Secondary | ICD-10-CM | POA: Diagnosis not present

## 2022-08-31 DIAGNOSIS — R079 Chest pain, unspecified: Secondary | ICD-10-CM | POA: Diagnosis not present

## 2022-08-31 DIAGNOSIS — Z1152 Encounter for screening for COVID-19: Secondary | ICD-10-CM | POA: Diagnosis not present

## 2022-08-31 DIAGNOSIS — E876 Hypokalemia: Secondary | ICD-10-CM | POA: Diagnosis not present

## 2022-08-31 DIAGNOSIS — M1990 Unspecified osteoarthritis, unspecified site: Secondary | ICD-10-CM | POA: Diagnosis not present

## 2022-08-31 DIAGNOSIS — Z886 Allergy status to analgesic agent status: Secondary | ICD-10-CM | POA: Diagnosis not present

## 2022-08-31 DIAGNOSIS — R9431 Abnormal electrocardiogram [ECG] [EKG]: Secondary | ICD-10-CM | POA: Diagnosis not present

## 2022-08-31 DIAGNOSIS — E039 Hypothyroidism, unspecified: Secondary | ICD-10-CM | POA: Diagnosis not present

## 2022-08-31 DIAGNOSIS — J440 Chronic obstructive pulmonary disease with acute lower respiratory infection: Secondary | ICD-10-CM | POA: Diagnosis not present

## 2022-08-31 DIAGNOSIS — Z881 Allergy status to other antibiotic agents status: Secondary | ICD-10-CM | POA: Diagnosis not present

## 2022-08-31 DIAGNOSIS — G894 Chronic pain syndrome: Secondary | ICD-10-CM | POA: Diagnosis not present

## 2022-08-31 DIAGNOSIS — K219 Gastro-esophageal reflux disease without esophagitis: Secondary | ICD-10-CM | POA: Diagnosis not present

## 2022-08-31 DIAGNOSIS — Z79899 Other long term (current) drug therapy: Secondary | ICD-10-CM | POA: Diagnosis not present

## 2022-08-31 DIAGNOSIS — E871 Hypo-osmolality and hyponatremia: Secondary | ICD-10-CM | POA: Diagnosis not present

## 2022-08-31 DIAGNOSIS — E860 Dehydration: Secondary | ICD-10-CM | POA: Diagnosis not present

## 2022-08-31 DIAGNOSIS — Z885 Allergy status to narcotic agent status: Secondary | ICD-10-CM | POA: Diagnosis not present

## 2022-08-31 DIAGNOSIS — Z888 Allergy status to other drugs, medicaments and biological substances status: Secondary | ICD-10-CM | POA: Diagnosis not present

## 2022-08-31 DIAGNOSIS — J90 Pleural effusion, not elsewhere classified: Secondary | ICD-10-CM | POA: Diagnosis not present

## 2022-08-31 DIAGNOSIS — I10 Essential (primary) hypertension: Secondary | ICD-10-CM | POA: Diagnosis not present

## 2022-08-31 DIAGNOSIS — R0602 Shortness of breath: Secondary | ICD-10-CM | POA: Diagnosis not present

## 2022-08-31 DIAGNOSIS — R160 Hepatomegaly, not elsewhere classified: Secondary | ICD-10-CM | POA: Diagnosis not present

## 2022-08-31 DIAGNOSIS — K760 Fatty (change of) liver, not elsewhere classified: Secondary | ICD-10-CM | POA: Diagnosis not present

## 2022-08-31 DIAGNOSIS — F32A Depression, unspecified: Secondary | ICD-10-CM | POA: Diagnosis not present

## 2022-09-01 DIAGNOSIS — R079 Chest pain, unspecified: Secondary | ICD-10-CM | POA: Diagnosis not present

## 2022-09-13 DIAGNOSIS — Z87891 Personal history of nicotine dependence: Secondary | ICD-10-CM | POA: Diagnosis not present

## 2022-09-13 DIAGNOSIS — J479 Bronchiectasis, uncomplicated: Secondary | ICD-10-CM | POA: Diagnosis not present

## 2022-09-13 DIAGNOSIS — R918 Other nonspecific abnormal finding of lung field: Secondary | ICD-10-CM | POA: Diagnosis not present

## 2022-09-13 DIAGNOSIS — J4530 Mild persistent asthma, uncomplicated: Secondary | ICD-10-CM | POA: Diagnosis not present

## 2022-09-13 DIAGNOSIS — I2510 Atherosclerotic heart disease of native coronary artery without angina pectoris: Secondary | ICD-10-CM | POA: Diagnosis not present

## 2022-09-27 DIAGNOSIS — J479 Bronchiectasis, uncomplicated: Secondary | ICD-10-CM | POA: Diagnosis not present

## 2022-09-27 DIAGNOSIS — J4530 Mild persistent asthma, uncomplicated: Secondary | ICD-10-CM | POA: Diagnosis not present

## 2022-09-27 DIAGNOSIS — R918 Other nonspecific abnormal finding of lung field: Secondary | ICD-10-CM | POA: Diagnosis not present

## 2022-09-27 DIAGNOSIS — I2510 Atherosclerotic heart disease of native coronary artery without angina pectoris: Secondary | ICD-10-CM | POA: Diagnosis not present

## 2022-09-27 DIAGNOSIS — Z87891 Personal history of nicotine dependence: Secondary | ICD-10-CM | POA: Diagnosis not present

## 2022-10-10 ENCOUNTER — Other Ambulatory Visit: Payer: Self-pay | Admitting: Internal Medicine

## 2022-10-10 MED ORDER — PREGABALIN 150 MG PO CAPS: 150.0000 mg | ORAL_CAPSULE | Freq: Three times a day (TID) | ORAL | 2 refills | Status: DC

## 2022-10-11 DIAGNOSIS — M25511 Pain in right shoulder: Secondary | ICD-10-CM | POA: Diagnosis not present

## 2022-10-11 DIAGNOSIS — G894 Chronic pain syndrome: Secondary | ICD-10-CM | POA: Diagnosis not present

## 2022-10-11 DIAGNOSIS — M545 Low back pain, unspecified: Secondary | ICD-10-CM | POA: Diagnosis not present

## 2022-10-11 DIAGNOSIS — M19011 Primary osteoarthritis, right shoulder: Secondary | ICD-10-CM | POA: Diagnosis not present

## 2022-10-11 DIAGNOSIS — Z79891 Long term (current) use of opiate analgesic: Secondary | ICD-10-CM | POA: Diagnosis not present

## 2022-10-11 DIAGNOSIS — M542 Cervicalgia: Secondary | ICD-10-CM | POA: Diagnosis not present

## 2022-10-18 ENCOUNTER — Other Ambulatory Visit: Payer: Self-pay | Admitting: Internal Medicine

## 2022-10-18 MED ORDER — PHENTERMINE HCL 37.5 MG PO TABS: 37.5000 mg | ORAL_TABLET | Freq: Every day | ORAL | 1 refills | Status: DC

## 2022-10-23 ENCOUNTER — Other Ambulatory Visit: Payer: Self-pay | Admitting: Internal Medicine

## 2022-10-23 ENCOUNTER — Other Ambulatory Visit: Payer: Self-pay

## 2022-10-23 MED ORDER — MONTELUKAST SODIUM 10 MG PO TABS: 10.0000 mg | ORAL_TABLET | Freq: Every day | ORAL | 0 refills | Status: DC

## 2022-10-23 MED ORDER — PHENTERMINE HCL 37.5 MG PO TABS: 37.5000 mg | ORAL_TABLET | Freq: Every day | ORAL | 1 refills | Status: DC

## 2022-11-01 DIAGNOSIS — I2510 Atherosclerotic heart disease of native coronary artery without angina pectoris: Secondary | ICD-10-CM | POA: Diagnosis not present

## 2022-11-01 DIAGNOSIS — R918 Other nonspecific abnormal finding of lung field: Secondary | ICD-10-CM | POA: Diagnosis not present

## 2022-11-01 DIAGNOSIS — J479 Bronchiectasis, uncomplicated: Secondary | ICD-10-CM | POA: Diagnosis not present

## 2022-11-01 DIAGNOSIS — Z87891 Personal history of nicotine dependence: Secondary | ICD-10-CM | POA: Diagnosis not present

## 2022-11-01 DIAGNOSIS — J4530 Mild persistent asthma, uncomplicated: Secondary | ICD-10-CM | POA: Diagnosis not present

## 2022-11-08 DIAGNOSIS — M62830 Muscle spasm of back: Secondary | ICD-10-CM | POA: Diagnosis not present

## 2022-11-08 DIAGNOSIS — M542 Cervicalgia: Secondary | ICD-10-CM | POA: Diagnosis not present

## 2022-11-08 DIAGNOSIS — G894 Chronic pain syndrome: Secondary | ICD-10-CM | POA: Diagnosis not present

## 2022-11-08 DIAGNOSIS — M545 Low back pain, unspecified: Secondary | ICD-10-CM | POA: Diagnosis not present

## 2022-11-08 DIAGNOSIS — Z79891 Long term (current) use of opiate analgesic: Secondary | ICD-10-CM | POA: Diagnosis not present

## 2022-11-08 DIAGNOSIS — M25511 Pain in right shoulder: Secondary | ICD-10-CM | POA: Diagnosis not present

## 2022-11-08 DIAGNOSIS — Z79899 Other long term (current) drug therapy: Secondary | ICD-10-CM | POA: Diagnosis not present

## 2022-11-08 DIAGNOSIS — M19011 Primary osteoarthritis, right shoulder: Secondary | ICD-10-CM | POA: Diagnosis not present

## 2022-11-28 ENCOUNTER — Other Ambulatory Visit: Payer: Self-pay

## 2022-11-28 MED ORDER — AMLODIPINE BESYLATE 10 MG PO TABS: 10.0000 mg | ORAL_TABLET | Freq: Every day | ORAL | 0 refills | Status: DC

## 2022-12-06 DIAGNOSIS — M25511 Pain in right shoulder: Secondary | ICD-10-CM | POA: Diagnosis not present

## 2022-12-06 DIAGNOSIS — M545 Low back pain, unspecified: Secondary | ICD-10-CM | POA: Diagnosis not present

## 2022-12-06 DIAGNOSIS — M19011 Primary osteoarthritis, right shoulder: Secondary | ICD-10-CM | POA: Diagnosis not present

## 2022-12-06 DIAGNOSIS — M62830 Muscle spasm of back: Secondary | ICD-10-CM | POA: Diagnosis not present

## 2022-12-06 DIAGNOSIS — G894 Chronic pain syndrome: Secondary | ICD-10-CM | POA: Diagnosis not present

## 2022-12-06 DIAGNOSIS — M542 Cervicalgia: Secondary | ICD-10-CM | POA: Diagnosis not present

## 2022-12-06 DIAGNOSIS — Z79891 Long term (current) use of opiate analgesic: Secondary | ICD-10-CM | POA: Diagnosis not present

## 2022-12-07 DIAGNOSIS — R918 Other nonspecific abnormal finding of lung field: Secondary | ICD-10-CM | POA: Diagnosis not present

## 2022-12-07 DIAGNOSIS — J9811 Atelectasis: Secondary | ICD-10-CM | POA: Diagnosis not present

## 2022-12-07 DIAGNOSIS — J479 Bronchiectasis, uncomplicated: Secondary | ICD-10-CM | POA: Diagnosis not present

## 2022-12-07 DIAGNOSIS — I2510 Atherosclerotic heart disease of native coronary artery without angina pectoris: Secondary | ICD-10-CM | POA: Diagnosis not present

## 2022-12-07 DIAGNOSIS — I700 Atherosclerosis of aorta: Secondary | ICD-10-CM | POA: Diagnosis not present

## 2022-12-12 ENCOUNTER — Other Ambulatory Visit: Payer: Self-pay

## 2022-12-12 MED ORDER — KERENDIA 10 MG PO TABS: 10.0000 mg | ORAL_TABLET | Freq: Every day | ORAL | 0 refills | Status: DC

## 2022-12-13 DIAGNOSIS — J479 Bronchiectasis, uncomplicated: Secondary | ICD-10-CM | POA: Diagnosis not present

## 2022-12-13 DIAGNOSIS — R918 Other nonspecific abnormal finding of lung field: Secondary | ICD-10-CM | POA: Diagnosis not present

## 2022-12-13 DIAGNOSIS — I2510 Atherosclerotic heart disease of native coronary artery without angina pectoris: Secondary | ICD-10-CM | POA: Diagnosis not present

## 2022-12-13 DIAGNOSIS — J4530 Mild persistent asthma, uncomplicated: Secondary | ICD-10-CM | POA: Diagnosis not present

## 2022-12-13 DIAGNOSIS — Z87891 Personal history of nicotine dependence: Secondary | ICD-10-CM | POA: Diagnosis not present

## 2022-12-24 ENCOUNTER — Ambulatory Visit: Payer: 59 | Admitting: Internal Medicine

## 2022-12-24 ENCOUNTER — Encounter: Payer: Self-pay | Admitting: Internal Medicine

## 2022-12-24 VITALS — BP 140/82 | HR 104 | Temp 97.0°F | Resp 18 | Ht 68.0 in | Wt 174.5 lb

## 2022-12-24 DIAGNOSIS — R5383 Other fatigue: Secondary | ICD-10-CM

## 2022-12-24 DIAGNOSIS — I10 Essential (primary) hypertension: Secondary | ICD-10-CM | POA: Diagnosis not present

## 2022-12-24 DIAGNOSIS — E785 Hyperlipidemia, unspecified: Secondary | ICD-10-CM

## 2022-12-24 DIAGNOSIS — E039 Hypothyroidism, unspecified: Secondary | ICD-10-CM | POA: Diagnosis not present

## 2022-12-24 DIAGNOSIS — E1169 Type 2 diabetes mellitus with other specified complication: Secondary | ICD-10-CM | POA: Diagnosis not present

## 2022-12-24 NOTE — Assessment & Plan Note (Signed)
I will do labs then re-evaluate.

## 2022-12-24 NOTE — Progress Notes (Signed)
   Office Visit  Subjective   Patient ID: Jill Robertson   DOB: Sep 01, 1961   Age: 62 y.o.   MRN: [id number]   Chief Complaint Chief Complaint  Patient presents with   Follow-up    Follow up      History of Present Illness 62 years old female is here for follow up. She says that she is very fatigued and tired.  She has lost significant amount of weight.  I have discussed with her about adding some protein supplements.  She also has a history of diabetes mellitus and does not check her sugar.  She has no further fall.  She does take Ozempic and I have discussed with her decreasing the dose.   She also has hypothyroidism and I have decreased the dose of levothyroxine and she is due for her TSH today.    She is still very weak and fragile.   She has hypertension and blood pressure  fluctuate.  Past Medical History Past Medical History:  Diagnosis Date   Arthritis    Asthma    Depression    Fibromyalgia    Heart murmur    ASYMPTOMATIC   High blood cholesterol level    Hypertension    Hypothyroidism    Pancreatitis 06/2016     Allergies Allergies  Allergen Reactions   Codeine Itching and Nausea And Vomiting   Propoxyphene Hcl Nausea And Vomiting and Other (See Comments)    hallucinations   Tramadol Nausea And Vomiting     Review of Systems Review of Systems  Constitutional:  Positive for malaise/fatigue.  HENT: Negative.    Respiratory: Negative.    Cardiovascular: Negative.   Musculoskeletal:  Positive for myalgias.  Neurological: Negative.        Objective:    Vitals BP (!) 140/82 (BP Location: Right Arm, Patient Position: Sitting, Cuff Size: Normal)   Pulse (!) 104   Temp (!) 97 F (36.1 C)   Resp 18   Ht 5\' 8"  (1.727 m)   Wt 174 lb 8 oz (79.2 kg)   SpO2 94%   BMI 26.53 kg/m    Physical Examination Physical Exam HENT:     Head: Normocephalic and atraumatic.  Eyes:     Extraocular Movements: Extraocular movements intact.     Pupils: Pupils are  equal, round, and reactive to light.  Cardiovascular:     Rate and Rhythm: Normal rate and regular rhythm.     Heart sounds: Normal heart sounds.  Pulmonary:     Effort: Pulmonary effort is normal.     Breath sounds: Normal breath sounds.  Abdominal:     General: Bowel sounds are normal.     Palpations: Abdomen is soft.  Neurological:     General: No focal deficit present.     Mental Status: She is oriented to person, place, and time.        Assessment & Plan:   Essential hypertension Blood pressure is not well control  Hypothyroidism Her TSH was 0.016 and stopped taking synthroid 150 mcg in November  Dyslipidemia associated with type 2 diabetes mellitus (HCC) She is on ozympic and rosuvastatina and she need tabs today.  Fatigue I will do labs then re-evaluate.    No follow-ups on file.   Eloisa Northern, MD

## 2022-12-24 NOTE — Assessment & Plan Note (Signed)
Blood pressure is not well control

## 2022-12-24 NOTE — Assessment & Plan Note (Signed)
She is on ozympic and rosuvastatina and she need tabs today.

## 2022-12-24 NOTE — Assessment & Plan Note (Signed)
Her TSH was 0.016 and stopped taking synthroid 150 mcg in November

## 2022-12-25 ENCOUNTER — Other Ambulatory Visit: Payer: Self-pay | Admitting: Internal Medicine

## 2022-12-25 LAB — CMP14 + ANION GAP
ALT: 15 IU/L (ref 0–32)
AST: 25 IU/L (ref 0–40)
Albumin/Globulin Ratio: 1 — ABNORMAL LOW (ref 1.2–2.2)
Albumin: 3.1 g/dL — ABNORMAL LOW (ref 3.9–4.9)
Alkaline Phosphatase: 249 IU/L — ABNORMAL HIGH (ref 44–121)
Anion Gap: 18 mmol/L (ref 10.0–18.0)
BUN/Creatinine Ratio: 11 — ABNORMAL LOW (ref 12–28)
BUN: 10 mg/dL (ref 8–27)
Bilirubin Total: 0.4 mg/dL (ref 0.0–1.2)
CO2: 18 mmol/L — ABNORMAL LOW (ref 20–29)
Calcium: 9.1 mg/dL (ref 8.7–10.3)
Chloride: 102 mmol/L (ref 96–106)
Creatinine, Ser: 0.91 mg/dL (ref 0.57–1.00)
Globulin, Total: 3.2 g/dL (ref 1.5–4.5)
Glucose: 112 mg/dL — ABNORMAL HIGH (ref 70–99)
Potassium: 4.9 mmol/L (ref 3.5–5.2)
Sodium: 138 mmol/L (ref 134–144)
Total Protein: 6.3 g/dL (ref 6.0–8.5)
eGFR: 71 mL/min/{1.73_m2} (ref >59)

## 2022-12-25 LAB — CBC WITH DIFFERENTIAL/PLATELET
Basophils Absolute: 0 10*3/uL (ref 0.0–0.2)
Basos: 0 %
EOS (ABSOLUTE): 0.8 10*3/uL — ABNORMAL HIGH (ref 0.0–0.4)
Eos: 4 %
Hematocrit: 41 % (ref 34.0–46.6)
Hemoglobin: 13.6 g/dL (ref 11.1–15.9)
Immature Grans (Abs): 0.1 10*3/uL (ref 0.0–0.1)
Immature Granulocytes: 1 %
Lymphocytes Absolute: 2 10*3/uL (ref 0.7–3.1)
Lymphs: 11 %
MCH: 28 pg (ref 26.6–33.0)
MCHC: 33.2 g/dL (ref 31.5–35.7)
MCV: 85 fL (ref 79–97)
Monocytes Absolute: 1.3 10*3/uL — ABNORMAL HIGH (ref 0.1–0.9)
Monocytes: 7 %
Neutrophils Absolute: 13.6 10*3/uL — ABNORMAL HIGH (ref 1.4–7.0)
Neutrophils: 77 %
Platelets: 518 10*3/uL — ABNORMAL HIGH (ref 150–450)
RBC: 4.85 x10E6/uL (ref 3.77–5.28)
RDW: 13.6 % (ref 11.7–15.4)
WBC: 17.7 10*3/uL — ABNORMAL HIGH (ref 3.4–10.8)

## 2022-12-25 LAB — LIPID PANEL
Chol/HDL Ratio: 2.3 ratio (ref 0.0–4.4)
Cholesterol, Total: 100 mg/dL (ref 100–199)
HDL: 44 mg/dL (ref >39)
LDL Chol Calc (NIH): 37 mg/dL (ref 0–99)
Triglycerides: 102 mg/dL (ref 0–149)
VLDL Cholesterol Cal: 19 mg/dL (ref 5–40)

## 2022-12-25 LAB — HEMOGLOBIN A1C
Est. average glucose Bld gHb Est-mCnc: 94 mg/dL
Hgb A1c MFr Bld: 4.9 % (ref 4.8–5.6)

## 2022-12-25 LAB — TSH: TSH: 0.008 u[IU]/mL — ABNORMAL LOW (ref 0.450–4.500)

## 2022-12-25 MED ORDER — LEVOTHYROXINE SODIUM 112 MCG PO TABS: 112.0000 ug | ORAL_TABLET | Freq: Every day | ORAL | 3 refills | Status: DC

## 2022-12-27 ENCOUNTER — Other Ambulatory Visit: Payer: Self-pay | Admitting: Internal Medicine

## 2023-01-03 DIAGNOSIS — M19011 Primary osteoarthritis, right shoulder: Secondary | ICD-10-CM | POA: Diagnosis not present

## 2023-01-03 DIAGNOSIS — M542 Cervicalgia: Secondary | ICD-10-CM | POA: Diagnosis not present

## 2023-01-03 DIAGNOSIS — M25511 Pain in right shoulder: Secondary | ICD-10-CM | POA: Diagnosis not present

## 2023-01-03 DIAGNOSIS — M545 Low back pain, unspecified: Secondary | ICD-10-CM | POA: Diagnosis not present

## 2023-01-03 DIAGNOSIS — G894 Chronic pain syndrome: Secondary | ICD-10-CM | POA: Diagnosis not present

## 2023-01-03 DIAGNOSIS — Z79891 Long term (current) use of opiate analgesic: Secondary | ICD-10-CM | POA: Diagnosis not present

## 2023-01-07 ENCOUNTER — Ambulatory Visit: Payer: 59 | Admitting: Internal Medicine

## 2023-01-07 DIAGNOSIS — E039 Hypothyroidism, unspecified: Secondary | ICD-10-CM | POA: Diagnosis not present

## 2023-01-07 DIAGNOSIS — R531 Weakness: Secondary | ICD-10-CM | POA: Diagnosis not present

## 2023-01-07 DIAGNOSIS — Z79899 Other long term (current) drug therapy: Secondary | ICD-10-CM | POA: Diagnosis not present

## 2023-01-07 DIAGNOSIS — F17210 Nicotine dependence, cigarettes, uncomplicated: Secondary | ICD-10-CM | POA: Diagnosis not present

## 2023-01-07 DIAGNOSIS — Z9842 Cataract extraction status, left eye: Secondary | ICD-10-CM | POA: Diagnosis not present

## 2023-01-07 DIAGNOSIS — I10 Essential (primary) hypertension: Secondary | ICD-10-CM | POA: Diagnosis not present

## 2023-01-07 DIAGNOSIS — R55 Syncope and collapse: Secondary | ICD-10-CM | POA: Diagnosis not present

## 2023-01-07 DIAGNOSIS — S82832A Other fracture of upper and lower end of left fibula, initial encounter for closed fracture: Secondary | ICD-10-CM | POA: Diagnosis not present

## 2023-01-07 DIAGNOSIS — W19XXXA Unspecified fall, initial encounter: Secondary | ICD-10-CM | POA: Diagnosis not present

## 2023-01-07 DIAGNOSIS — E876 Hypokalemia: Secondary | ICD-10-CM | POA: Diagnosis not present

## 2023-01-07 DIAGNOSIS — Z9181 History of falling: Secondary | ICD-10-CM | POA: Diagnosis not present

## 2023-01-07 DIAGNOSIS — Z1152 Encounter for screening for COVID-19: Secondary | ICD-10-CM | POA: Diagnosis not present

## 2023-01-07 DIAGNOSIS — Z85828 Personal history of other malignant neoplasm of skin: Secondary | ICD-10-CM | POA: Diagnosis not present

## 2023-01-07 DIAGNOSIS — M79671 Pain in right foot: Secondary | ICD-10-CM | POA: Diagnosis not present

## 2023-01-07 DIAGNOSIS — Z85118 Personal history of other malignant neoplasm of bronchus and lung: Secondary | ICD-10-CM | POA: Diagnosis not present

## 2023-01-07 DIAGNOSIS — R2681 Unsteadiness on feet: Secondary | ICD-10-CM | POA: Diagnosis not present

## 2023-01-07 DIAGNOSIS — Z902 Acquired absence of lung [part of]: Secondary | ICD-10-CM | POA: Diagnosis not present

## 2023-01-07 DIAGNOSIS — M25571 Pain in right ankle and joints of right foot: Secondary | ICD-10-CM | POA: Diagnosis not present

## 2023-01-07 DIAGNOSIS — Z8614 Personal history of Methicillin resistant Staphylococcus aureus infection: Secondary | ICD-10-CM | POA: Diagnosis not present

## 2023-01-07 DIAGNOSIS — M25562 Pain in left knee: Secondary | ICD-10-CM | POA: Diagnosis not present

## 2023-01-07 DIAGNOSIS — Z7982 Long term (current) use of aspirin: Secondary | ICD-10-CM | POA: Diagnosis not present

## 2023-01-07 DIAGNOSIS — Z833 Family history of diabetes mellitus: Secondary | ICD-10-CM | POA: Diagnosis not present

## 2023-01-07 DIAGNOSIS — Z87442 Personal history of urinary calculi: Secondary | ICD-10-CM | POA: Diagnosis not present

## 2023-01-07 DIAGNOSIS — Z9841 Cataract extraction status, right eye: Secondary | ICD-10-CM | POA: Diagnosis not present

## 2023-01-07 DIAGNOSIS — Z881 Allergy status to other antibiotic agents status: Secondary | ICD-10-CM | POA: Diagnosis not present

## 2023-01-07 DIAGNOSIS — J449 Chronic obstructive pulmonary disease, unspecified: Secondary | ICD-10-CM | POA: Diagnosis not present

## 2023-01-07 DIAGNOSIS — M25572 Pain in left ankle and joints of left foot: Secondary | ICD-10-CM | POA: Diagnosis not present

## 2023-01-07 DIAGNOSIS — R9431 Abnormal electrocardiogram [ECG] [EKG]: Secondary | ICD-10-CM | POA: Diagnosis not present

## 2023-01-07 DIAGNOSIS — Z8249 Family history of ischemic heart disease and other diseases of the circulatory system: Secondary | ICD-10-CM | POA: Diagnosis not present

## 2023-01-07 DIAGNOSIS — M25561 Pain in right knee: Secondary | ICD-10-CM | POA: Diagnosis not present

## 2023-01-08 DIAGNOSIS — E876 Hypokalemia: Secondary | ICD-10-CM | POA: Diagnosis not present

## 2023-01-08 DIAGNOSIS — S93491A Sprain of other ligament of right ankle, initial encounter: Secondary | ICD-10-CM | POA: Diagnosis not present

## 2023-01-08 DIAGNOSIS — S82832A Other fracture of upper and lower end of left fibula, initial encounter for closed fracture: Secondary | ICD-10-CM | POA: Diagnosis not present

## 2023-01-08 DIAGNOSIS — W19XXXA Unspecified fall, initial encounter: Secondary | ICD-10-CM | POA: Diagnosis not present

## 2023-01-09 DIAGNOSIS — E876 Hypokalemia: Secondary | ICD-10-CM | POA: Diagnosis not present

## 2023-01-09 DIAGNOSIS — S82832A Other fracture of upper and lower end of left fibula, initial encounter for closed fracture: Secondary | ICD-10-CM | POA: Diagnosis not present

## 2023-01-09 DIAGNOSIS — W19XXXA Unspecified fall, initial encounter: Secondary | ICD-10-CM | POA: Diagnosis not present

## 2023-01-14 ENCOUNTER — Other Ambulatory Visit: Payer: Self-pay | Admitting: Internal Medicine

## 2023-01-14 ENCOUNTER — Inpatient Hospital Stay: Payer: 59 | Admitting: Internal Medicine

## 2023-01-14 DIAGNOSIS — S82832A Other fracture of upper and lower end of left fibula, initial encounter for closed fracture: Secondary | ICD-10-CM | POA: Diagnosis not present

## 2023-01-16 ENCOUNTER — Other Ambulatory Visit: Payer: Self-pay | Admitting: Internal Medicine

## 2023-01-16 MED ORDER — PREGABALIN 150 MG PO CAPS: 150.0000 mg | ORAL_CAPSULE | Freq: Three times a day (TID) | ORAL | 2 refills | Status: DC

## 2023-01-21 ENCOUNTER — Encounter: Payer: Self-pay | Admitting: Internal Medicine

## 2023-01-21 ENCOUNTER — Ambulatory Visit: Payer: 59 | Admitting: Internal Medicine

## 2023-01-21 VITALS — BP 140/80 | HR 87 | Temp 97.7°F | Resp 18 | Ht 68.0 in | Wt 166.4 lb

## 2023-01-21 DIAGNOSIS — I10 Essential (primary) hypertension: Secondary | ICD-10-CM | POA: Diagnosis not present

## 2023-01-21 DIAGNOSIS — S82455D Nondisplaced comminuted fracture of shaft of left fibula, subsequent encounter for closed fracture with routine healing: Secondary | ICD-10-CM | POA: Diagnosis not present

## 2023-01-21 DIAGNOSIS — F411 Generalized anxiety disorder: Secondary | ICD-10-CM | POA: Diagnosis not present

## 2023-01-21 DIAGNOSIS — E039 Hypothyroidism, unspecified: Secondary | ICD-10-CM | POA: Diagnosis not present

## 2023-01-21 DIAGNOSIS — E1169 Type 2 diabetes mellitus with other specified complication: Secondary | ICD-10-CM

## 2023-01-21 DIAGNOSIS — E785 Hyperlipidemia, unspecified: Secondary | ICD-10-CM | POA: Diagnosis not present

## 2023-01-21 DIAGNOSIS — S82456D Nondisplaced comminuted fracture of shaft of unspecified fibula, subsequent encounter for closed fracture with routine healing: Secondary | ICD-10-CM | POA: Insufficient documentation

## 2023-01-21 MED ORDER — CITALOPRAM HYDROBROMIDE 10 MG PO TABS: 10.0000 mg | ORAL_TABLET | Freq: Every day | ORAL | 3 refills | Status: DC

## 2023-01-21 NOTE — Assessment & Plan Note (Addendum)
Her HbA1c was 4.6% last month, I have stopped her ozempic because of significant weight loss. She also take Rosuvastatin 5 mg three time a week.

## 2023-01-21 NOTE — Assessment & Plan Note (Signed)
She is on levothyroxine 112 mcg daily. I will repeat TSH today.

## 2023-01-21 NOTE — Progress Notes (Addendum)
   Office Visit  Subjective   Patient ID: Jill Robertson   DOB: Dec 08, 1960   Age: 62 y.o.   MRN: [id number]   Chief Complaint Chief Complaint  Patient presents with   [facility] follow up     History of Present Illness 62 years old female is here for follow up. She fell as she passed out few times at home and started having severe pain in her left Febula. She was asmitted to [facility] on 01/08/23 and discharge home on 01/09/23. Non operative treatment was recommended. She is here for follow up. I have reviewed hospital discharge summary.  She has lost 8 more pounds since last visit. I have stopped her ozempic as her HbA1c was 4/7. Her BMI is 26 now.  She also has hypothyroidism and I have decreased the dose of levothyroxine 112 mcg last month. She is due for repeat TSH today.  She is still very weak and fragile.   Past Medical History Past Medical History:  Diagnosis Date   Arthritis    Asthma    Depression    Fibromyalgia    Heart murmur    ASYMPTOMATIC   High blood cholesterol level    Hypertension    Hypothyroidism    Pancreatitis 06/2016     Allergies Allergies  Allergen Reactions   Codeine Itching and Nausea And Vomiting   Propoxyphene Hcl Nausea And Vomiting and Other (See Comments)    hallucinations   Tramadol Nausea And Vomiting     Review of Systems Review of Systems  Constitutional: Negative.   HENT: Negative.    Respiratory: Negative.    Cardiovascular: Negative.   Gastrointestinal: Negative.   Neurological: Negative.        Objective:    Vitals BP (!) 140/80 (BP Location: Left Arm, Patient Position: Sitting, Cuff Size: Normal)   Pulse 87   Temp 97.7 F (36.5 C)   Resp 18   Ht 5\' 8"  (1.727 m)   Wt 166 lb 6 oz (75.5 kg)   SpO2 92%   BMI 25.30 kg/m    Physical Examination Physical Exam Constitutional:      Appearance: Normal appearance.  HENT:     Head: Normocephalic and atraumatic.  Cardiovascular:     Rate and Rhythm: Normal rate  and regular rhythm.     Heart sounds: Normal heart sounds.  Pulmonary:     Effort: Pulmonary effort is normal.     Breath sounds: Normal breath sounds.  Abdominal:     General: Bowel sounds are normal.     Palpations: Abdomen is soft.  Neurological:     General: No focal deficit present.     Mental Status: She is alert and oriented to person, place, and time.        Assessment & Plan:   Essential hypertension Slightly high but she is in pain, I will monitor.  Hypothyroidism She is on levothyroxine 112 mcg daily. I will repeat TSH today.  Dyslipidemia associated with type 2 diabetes mellitus (HCC) Her HbA1c was 4.6% last month, I have stopped her ozempic because of significant weight loss. She also take Rosuvastatin 5 mg three time a week.   ANXIETY DISORDER, GENERALIZED She was on citalopram and says her anxiety is still on big problem.     I will order shower chair for her.  Return in about 1 month (around 02/21/2023).   Garwin Brothers, MD

## 2023-01-21 NOTE — Assessment & Plan Note (Signed)
She was on citalopram and says her anxiety is still on big problem.

## 2023-01-21 NOTE — Assessment & Plan Note (Signed)
Slightly high but she is in pain, I will monitor.

## 2023-01-22 ENCOUNTER — Other Ambulatory Visit: Payer: Self-pay | Admitting: Internal Medicine

## 2023-01-22 LAB — TSH: TSH: 0.103 u[IU]/mL — ABNORMAL LOW (ref 0.450–4.500)

## 2023-01-22 MED ORDER — LEVOTHYROXINE SODIUM 100 MCG PO TABS: 100.0000 ug | ORAL_TABLET | Freq: Every day | ORAL | 3 refills | Status: DC

## 2023-01-30 ENCOUNTER — Other Ambulatory Visit: Payer: Self-pay

## 2023-01-30 DIAGNOSIS — S82422D Displaced transverse fracture of shaft of left fibula, subsequent encounter for closed fracture with routine healing: Secondary | ICD-10-CM | POA: Diagnosis not present

## 2023-01-30 DIAGNOSIS — M79605 Pain in left leg: Secondary | ICD-10-CM | POA: Diagnosis not present

## 2023-01-30 DIAGNOSIS — R300 Dysuria: Secondary | ICD-10-CM | POA: Diagnosis not present

## 2023-01-30 MED ORDER — MONTELUKAST SODIUM 10 MG PO TABS: 10.0000 mg | ORAL_TABLET | Freq: Every day | ORAL | 0 refills | Status: DC

## 2023-02-06 DIAGNOSIS — S82832A Other fracture of upper and lower end of left fibula, initial encounter for closed fracture: Secondary | ICD-10-CM | POA: Diagnosis not present

## 2023-02-07 DIAGNOSIS — M19011 Primary osteoarthritis, right shoulder: Secondary | ICD-10-CM | POA: Diagnosis not present

## 2023-02-07 DIAGNOSIS — M545 Low back pain, unspecified: Secondary | ICD-10-CM | POA: Diagnosis not present

## 2023-02-07 DIAGNOSIS — M25511 Pain in right shoulder: Secondary | ICD-10-CM | POA: Diagnosis not present

## 2023-02-07 DIAGNOSIS — M542 Cervicalgia: Secondary | ICD-10-CM | POA: Diagnosis not present

## 2023-02-07 DIAGNOSIS — M62830 Muscle spasm of back: Secondary | ICD-10-CM | POA: Diagnosis not present

## 2023-02-07 DIAGNOSIS — Z79891 Long term (current) use of opiate analgesic: Secondary | ICD-10-CM | POA: Diagnosis not present

## 2023-02-07 DIAGNOSIS — G894 Chronic pain syndrome: Secondary | ICD-10-CM | POA: Diagnosis not present

## 2023-02-18 ENCOUNTER — Other Ambulatory Visit: Payer: Self-pay

## 2023-02-18 MED ORDER — ROSUVASTATIN CALCIUM 5 MG PO TABS: 5.0000 mg | ORAL_TABLET | ORAL | 0 refills | Status: DC

## 2023-02-25 ENCOUNTER — Ambulatory Visit: Payer: 59 | Admitting: Internal Medicine

## 2023-02-25 ENCOUNTER — Encounter: Payer: Self-pay | Admitting: Internal Medicine

## 2023-02-25 VITALS — BP 134/80 | HR 84 | Temp 96.0°F | Resp 18 | Ht 68.0 in | Wt 168.2 lb

## 2023-02-25 DIAGNOSIS — E1169 Type 2 diabetes mellitus with other specified complication: Secondary | ICD-10-CM

## 2023-02-25 DIAGNOSIS — I10 Essential (primary) hypertension: Secondary | ICD-10-CM

## 2023-02-25 DIAGNOSIS — E039 Hypothyroidism, unspecified: Secondary | ICD-10-CM | POA: Diagnosis not present

## 2023-02-25 DIAGNOSIS — J4520 Mild intermittent asthma, uncomplicated: Secondary | ICD-10-CM | POA: Diagnosis not present

## 2023-02-25 DIAGNOSIS — B001 Herpesviral vesicular dermatitis: Secondary | ICD-10-CM | POA: Insufficient documentation

## 2023-02-25 DIAGNOSIS — E785 Hyperlipidemia, unspecified: Secondary | ICD-10-CM

## 2023-02-25 MED ORDER — VALACYCLOVIR HCL 500 MG PO TABS: 500.0000 mg | ORAL_TABLET | Freq: Two times a day (BID) | ORAL | 3 refills | Status: DC

## 2023-02-25 NOTE — Assessment & Plan Note (Signed)
Her dose was decreased so will repeat Tsh today.

## 2023-02-25 NOTE — Assessment & Plan Note (Signed)
Her cholesterol is well controlled and will continue to monitor her sugar.

## 2023-02-25 NOTE — Addendum Note (Signed)
Addended byEloisa Northern on: 02/25/2023 02:23 PM   Modules accepted: Orders

## 2023-02-25 NOTE — Assessment & Plan Note (Signed)
Little high, will monitor

## 2023-02-25 NOTE — Progress Notes (Signed)
   Office Visit  Subjective   Patient ID: Jill Robertson   DOB: [birth year]   Age: 62 y.o.   MRN: [id number]   Chief Complaint Chief Complaint  Patient presents with   Follow-up    1 month follow up     History of Present Illness 62 years old female is here for follow up. She says that she she has no further fall. She says she develop cold sore on her left lip. She says that her prescription was too old.   I have stopped her ozempic as her HbA1c was 4/7. Her BMI is 26 now.  She also has hypothyroidism and I have decreased the dose of levothyroxine 112 mcg last month. She is due for repeat TSH today.  She is still very weak and fragile.   Past Medical History Past Medical History:  Diagnosis Date   Arthritis    Asthma    Depression    Fibromyalgia    Heart murmur    ASYMPTOMATIC   High blood cholesterol level    Hypertension    Hypothyroidism    Pancreatitis 06/2016     Allergies Allergies  Allergen Reactions   Codeine Itching and Nausea And Vomiting   Propoxyphene Hcl Nausea And Vomiting and Other (See Comments)    hallucinations   Tramadol Nausea And Vomiting     Review of Systems Review of Systems  Constitutional: Negative.   HENT: Negative.    Respiratory: Negative.    Cardiovascular: Negative.   Gastrointestinal: Negative.   Neurological: Negative.        Objective:    Vitals BP 134/80 (BP Location: Left Arm, Patient Position: Sitting, Cuff Size: Normal)   Pulse 84   Temp (!) 96 F (35.6 C)   Resp 18   Ht 5\' 8"  (1.727 m)   Wt 168 lb 4 oz (76.3 kg)   SpO2 96%   BMI 25.58 kg/m    Physical Examination Physical Exam Constitutional:      Appearance: Normal appearance.  HENT:     Head: Normocephalic and atraumatic.  Cardiovascular:     Rate and Rhythm: Normal rate and regular rhythm.     Heart sounds: Normal heart sounds.  Pulmonary:     Effort: Pulmonary effort is normal.     Breath sounds: Normal breath sounds.  Neurological:      General: No focal deficit present.     Mental Status: She is alert and oriented to person, place, and time.        Assessment & Plan:   Essential hypertension Little high, will monitor  Hypothyroidism Her dose was decreased so will repeat Tsh today.  Dyslipidemia associated with type 2 diabetes mellitus (HCC) Her cholesterol is well controlled and will continue to monitor her sugar.    No follow-ups on file.   Eloisa Northern, MD

## 2023-02-25 NOTE — Assessment & Plan Note (Signed)
I will send refill of valcyclovir 500 mg twice a day for 5 days.

## 2023-02-26 LAB — TSH: TSH: 1.1 u[IU]/mL (ref 0.450–4.500)

## 2023-03-01 ENCOUNTER — Other Ambulatory Visit: Payer: Self-pay | Admitting: Internal Medicine

## 2023-03-04 ENCOUNTER — Other Ambulatory Visit: Payer: Self-pay

## 2023-03-04 ENCOUNTER — Other Ambulatory Visit: Payer: Self-pay | Admitting: Internal Medicine

## 2023-03-04 MED ORDER — METFORMIN HCL ER 500 MG PO TB24: 500.0000 mg | ORAL_TABLET | Freq: Every day | ORAL | 0 refills | Status: DC

## 2023-03-04 MED ORDER — LEVOCETIRIZINE DIHYDROCHLORIDE 5 MG PO TABS: 5.0000 mg | ORAL_TABLET | Freq: Every evening | ORAL | 2 refills | Status: DC

## 2023-03-05 DIAGNOSIS — Z1211 Encounter for screening for malignant neoplasm of colon: Secondary | ICD-10-CM | POA: Diagnosis not present

## 2023-03-05 DIAGNOSIS — Z1212 Encounter for screening for malignant neoplasm of rectum: Secondary | ICD-10-CM | POA: Diagnosis not present

## 2023-03-07 DIAGNOSIS — M542 Cervicalgia: Secondary | ICD-10-CM | POA: Diagnosis not present

## 2023-03-07 DIAGNOSIS — G894 Chronic pain syndrome: Secondary | ICD-10-CM | POA: Diagnosis not present

## 2023-03-07 DIAGNOSIS — Z79891 Long term (current) use of opiate analgesic: Secondary | ICD-10-CM | POA: Diagnosis not present

## 2023-03-07 DIAGNOSIS — M62830 Muscle spasm of back: Secondary | ICD-10-CM | POA: Diagnosis not present

## 2023-03-07 DIAGNOSIS — M25511 Pain in right shoulder: Secondary | ICD-10-CM | POA: Diagnosis not present

## 2023-03-07 DIAGNOSIS — M19011 Primary osteoarthritis, right shoulder: Secondary | ICD-10-CM | POA: Diagnosis not present

## 2023-03-07 DIAGNOSIS — Z79899 Other long term (current) drug therapy: Secondary | ICD-10-CM | POA: Diagnosis not present

## 2023-03-07 DIAGNOSIS — Z1389 Encounter for screening for other disorder: Secondary | ICD-10-CM | POA: Diagnosis not present

## 2023-03-07 DIAGNOSIS — M545 Low back pain, unspecified: Secondary | ICD-10-CM | POA: Diagnosis not present

## 2023-03-08 DIAGNOSIS — R0789 Other chest pain: Secondary | ICD-10-CM | POA: Diagnosis not present

## 2023-03-08 DIAGNOSIS — R918 Other nonspecific abnormal finding of lung field: Secondary | ICD-10-CM | POA: Diagnosis not present

## 2023-03-14 ENCOUNTER — Other Ambulatory Visit: Payer: Self-pay | Admitting: Internal Medicine

## 2023-03-14 DIAGNOSIS — J4530 Mild persistent asthma, uncomplicated: Secondary | ICD-10-CM | POA: Diagnosis not present

## 2023-03-14 DIAGNOSIS — I2510 Atherosclerotic heart disease of native coronary artery without angina pectoris: Secondary | ICD-10-CM | POA: Diagnosis not present

## 2023-03-14 DIAGNOSIS — Z87891 Personal history of nicotine dependence: Secondary | ICD-10-CM | POA: Diagnosis not present

## 2023-03-14 DIAGNOSIS — R918 Other nonspecific abnormal finding of lung field: Secondary | ICD-10-CM | POA: Diagnosis not present

## 2023-03-14 DIAGNOSIS — J479 Bronchiectasis, uncomplicated: Secondary | ICD-10-CM | POA: Diagnosis not present

## 2023-03-14 LAB — COLOGUARD: COLOGUARD: POSITIVE — AB

## 2023-03-18 DIAGNOSIS — J4530 Mild persistent asthma, uncomplicated: Secondary | ICD-10-CM | POA: Diagnosis not present

## 2023-03-27 ENCOUNTER — Other Ambulatory Visit: Payer: Self-pay

## 2023-03-27 MED ORDER — METFORMIN HCL ER 500 MG PO TB24: 500.0000 mg | ORAL_TABLET | Freq: Every day | ORAL | 0 refills | Status: DC

## 2023-03-28 ENCOUNTER — Other Ambulatory Visit: Payer: Self-pay

## 2023-03-28 MED ORDER — METFORMIN HCL ER 500 MG PO TB24: 1000.0000 mg | ORAL_TABLET | Freq: Every day | ORAL | 0 refills | Status: DC

## 2023-03-29 ENCOUNTER — Other Ambulatory Visit: Payer: Self-pay

## 2023-03-29 MED ORDER — METOPROLOL TARTRATE 25 MG PO TABS: 25.0000 mg | ORAL_TABLET | Freq: Two times a day (BID) | ORAL | 1 refills | Status: DC

## 2023-04-03 DIAGNOSIS — M62830 Muscle spasm of back: Secondary | ICD-10-CM | POA: Diagnosis not present

## 2023-04-03 DIAGNOSIS — M545 Low back pain, unspecified: Secondary | ICD-10-CM | POA: Diagnosis not present

## 2023-04-03 DIAGNOSIS — M542 Cervicalgia: Secondary | ICD-10-CM | POA: Diagnosis not present

## 2023-04-03 DIAGNOSIS — Z79899 Other long term (current) drug therapy: Secondary | ICD-10-CM | POA: Diagnosis not present

## 2023-04-03 DIAGNOSIS — Z79891 Long term (current) use of opiate analgesic: Secondary | ICD-10-CM | POA: Diagnosis not present

## 2023-04-03 DIAGNOSIS — M19011 Primary osteoarthritis, right shoulder: Secondary | ICD-10-CM | POA: Diagnosis not present

## 2023-04-03 DIAGNOSIS — G894 Chronic pain syndrome: Secondary | ICD-10-CM | POA: Diagnosis not present

## 2023-04-03 DIAGNOSIS — M25511 Pain in right shoulder: Secondary | ICD-10-CM | POA: Diagnosis not present

## 2023-04-05 ENCOUNTER — Ambulatory Visit: Payer: 59 | Admitting: Student

## 2023-04-05 ENCOUNTER — Encounter: Payer: Self-pay | Admitting: Student

## 2023-04-05 VITALS — BP 108/74 | HR 100 | Temp 97.2°F | Resp 18 | Ht 68.0 in | Wt 176.8 lb

## 2023-04-05 DIAGNOSIS — B351 Tinea unguium: Secondary | ICD-10-CM

## 2023-04-05 NOTE — Progress Notes (Signed)
   Established Patient Office Visit  Subjective   Patient ID: Jill Robertson, female    DOB: 02/14/1961  Age: 62 y.o. MRN: [id number]  Chief Complaint  Patient presents with   Referral    Dermatology for nails    Jill Robertson is a 62 year old white female who presents requesting a referral to a dermatologist for nailbed fungus on her right hand index finger and thumb; Left hand thumb only. There is soreness when she bumps them on hard surfaces, at times there is peeling with bleeding at its worst.  She states "years ago I was prescribed a cream and they were getting better, I noticed that they never fully healed and I believed it was from the MRSA infection I had." She denies pain or discomfort today. No other nailbeds (hands or feet) have been affected. She would like to see Dr. Lerry Liner with [facility] in [place] as she has done in the past.       Review of Systems  Constitutional: Negative.   Respiratory: Negative.    Cardiovascular: Negative.   Musculoskeletal: Negative.   Skin: Negative.   Endo/Heme/Allergies: Negative.   Psychiatric/Behavioral: Negative.        Objective:     BP 108/74 (BP Location: Right Arm, Patient Position: Sitting, Cuff Size: Normal)   Pulse 100   Temp (!) 97.2 F (36.2 C) (Temporal)   Resp 18   Ht 5\' 8"  (1.727 m)   Wt 176 lb 12.8 oz (80.2 kg)   SpO2 98%   BMI 26.88 kg/m    Physical Exam Constitutional:      Appearance: Normal appearance.  Cardiovascular:     Rate and Rhythm: Normal rate.     Pulses: Normal pulses.     Heart sounds: Normal heart sounds. No murmur heard. Pulmonary:     Effort: Pulmonary effort is normal.     Breath sounds: Normal breath sounds.  Musculoskeletal:        General: Normal range of motion.  Skin:    General: Skin is warm and dry.     Capillary Refill: Capillary refill takes less than 2 seconds.     Findings: No bruising, erythema or rash.     Nails: There is no clubbing (nailbeds  are missing from the right hand index and thumb. Left hand thumb).  Neurological:     Mental Status: She is alert and oriented to person, place, and time.  Psychiatric:        Mood and Affect: Mood normal.        Behavior: Behavior normal.       Assessment & Plan:   Problem List Items Addressed This Visit     Onychomycosis - Primary    A referral to Dr. Lerry Liner, [facility] in [place] for fungal infections of the nailbeds.         No follow-ups on file.    Edwena Blow, NP

## 2023-04-05 NOTE — Assessment & Plan Note (Signed)
A referral to Dr. Lerry Liner, [facility] in [place] for fungal infections of the nailbeds.

## 2023-04-16 ENCOUNTER — Other Ambulatory Visit: Payer: Self-pay | Admitting: Internal Medicine

## 2023-04-17 ENCOUNTER — Other Ambulatory Visit: Payer: Self-pay | Admitting: Internal Medicine

## 2023-04-18 DIAGNOSIS — J4530 Mild persistent asthma, uncomplicated: Secondary | ICD-10-CM | POA: Diagnosis not present

## 2023-04-22 ENCOUNTER — Other Ambulatory Visit: Payer: Self-pay | Admitting: Internal Medicine

## 2023-04-22 MED ORDER — PREGABALIN 150 MG PO CAPS: 150.0000 mg | ORAL_CAPSULE | Freq: Three times a day (TID) | ORAL | 2 refills | Status: DC

## 2023-04-24 ENCOUNTER — Other Ambulatory Visit: Payer: Self-pay

## 2023-04-24 MED ORDER — PREGABALIN 150 MG PO CAPS: 150.0000 mg | ORAL_CAPSULE | Freq: Three times a day (TID) | ORAL | 2 refills | Status: DC

## 2023-04-26 DIAGNOSIS — J22 Unspecified acute lower respiratory infection: Secondary | ICD-10-CM | POA: Diagnosis not present

## 2023-04-26 DIAGNOSIS — R051 Acute cough: Secondary | ICD-10-CM | POA: Diagnosis not present

## 2023-04-26 DIAGNOSIS — R079 Chest pain, unspecified: Secondary | ICD-10-CM | POA: Diagnosis not present

## 2023-04-26 DIAGNOSIS — J984 Other disorders of lung: Secondary | ICD-10-CM | POA: Diagnosis not present

## 2023-04-26 DIAGNOSIS — R918 Other nonspecific abnormal finding of lung field: Secondary | ICD-10-CM | POA: Diagnosis not present

## 2023-04-26 DIAGNOSIS — R059 Cough, unspecified: Secondary | ICD-10-CM | POA: Diagnosis not present

## 2023-05-09 DIAGNOSIS — M19011 Primary osteoarthritis, right shoulder: Secondary | ICD-10-CM | POA: Diagnosis not present

## 2023-05-09 DIAGNOSIS — M545 Low back pain, unspecified: Secondary | ICD-10-CM | POA: Diagnosis not present

## 2023-05-09 DIAGNOSIS — Z79891 Long term (current) use of opiate analgesic: Secondary | ICD-10-CM | POA: Diagnosis not present

## 2023-05-09 DIAGNOSIS — Z1389 Encounter for screening for other disorder: Secondary | ICD-10-CM | POA: Diagnosis not present

## 2023-05-09 DIAGNOSIS — Z79899 Other long term (current) drug therapy: Secondary | ICD-10-CM | POA: Diagnosis not present

## 2023-05-09 DIAGNOSIS — M542 Cervicalgia: Secondary | ICD-10-CM | POA: Diagnosis not present

## 2023-05-09 DIAGNOSIS — M25511 Pain in right shoulder: Secondary | ICD-10-CM | POA: Diagnosis not present

## 2023-05-18 DIAGNOSIS — J4530 Mild persistent asthma, uncomplicated: Secondary | ICD-10-CM | POA: Diagnosis not present

## 2023-05-20 ENCOUNTER — Other Ambulatory Visit: Payer: Self-pay | Admitting: Internal Medicine

## 2023-05-27 ENCOUNTER — Encounter: Payer: Self-pay | Admitting: Internal Medicine

## 2023-05-27 ENCOUNTER — Ambulatory Visit: Payer: 59 | Admitting: Internal Medicine

## 2023-05-27 VITALS — BP 116/70 | HR 77 | Temp 97.2°F | Resp 18 | Ht 68.0 in | Wt 191.0 lb

## 2023-05-27 DIAGNOSIS — E1169 Type 2 diabetes mellitus with other specified complication: Secondary | ICD-10-CM

## 2023-05-27 DIAGNOSIS — E785 Hyperlipidemia, unspecified: Secondary | ICD-10-CM

## 2023-05-27 DIAGNOSIS — F3342 Major depressive disorder, recurrent, in full remission: Secondary | ICD-10-CM

## 2023-05-27 DIAGNOSIS — I10 Essential (primary) hypertension: Secondary | ICD-10-CM

## 2023-05-27 DIAGNOSIS — E039 Hypothyroidism, unspecified: Secondary | ICD-10-CM | POA: Diagnosis not present

## 2023-05-27 NOTE — Progress Notes (Signed)
Office Visit  Subjective   Patient ID: Jill Robertson   DOB: 24-Jun-1961   Age: 62 y.o.   MRN: [id number]   Chief Complaint Chief Complaint  Patient presents with   Hypertension    Essential hypertension     History of Present Illness 62 years old female is here for follow up. She says that her appetite has come back, she has gained 20 pounds since April 24. She denies any further episode of passing out.   She was treated for pneumonia in 04/26/23 from urgent care, she has allergic reaction to Z-pak. .    I have stopped her ozempic as her HbA1c was 4.9 on 12/24/22. Her BMI is 29 today. She has eye exam here in our office this year by [facility]. She is asking for CGM.  She has hyperlipidemia and her LDL in 2/24 was controlled. She take rosuvastatin 5 mg daily.  She also has hypothyroidism and I have decreased the dose of levothyroxine 100 mcg daily 6 weeks ago and she is here for refill for repeat labs. She does not have any symptoms of hypothyroidism or hyperthyroidism.  last month. She is due for repeat TSH today.   She has hypertension, her blood pressure is good.  She says that her depression is better.    Past Medical History Past Medical History:  Diagnosis Date   Arthritis    Asthma    Depression    Fibromyalgia    Heart murmur    ASYMPTOMATIC   High blood cholesterol level    Hypertension    Hypothyroidism    Pancreatitis 06/2016     Allergies Allergies  Allergen Reactions   Codeine Itching, Nausea And Vomiting and Rash   Tramadol Nausea And Vomiting   Azithromycin    Bismuth Subsalicylate     Other Reaction(s): GI intolerance   Doxycycline     Other Reaction(s): GI intolerance   Fentanyl Swelling   Propoxyphene Hcl Nausea And Vomiting and Other (See Comments)    hallucinations     Review of Systems Review of Systems  Constitutional: Negative.   Respiratory: Negative.    Cardiovascular: Negative.   Gastrointestinal: Negative.   Neurological: Negative.         Objective:    Vitals BP 116/70 (BP Location: Left Arm, Patient Position: Sitting, Cuff Size: Normal)   Pulse 77   Temp (!) 97.2 F (36.2 C)   Resp 18   Ht 5\' 8"  (1.727 m)   Wt 191 lb (86.6 kg)   SpO2 91%   BMI 29.04 kg/m    Physical Examination Physical Exam Constitutional:      Appearance: Normal appearance.  HENT:     Head: Normocephalic and atraumatic.  Cardiovascular:     Rate and Rhythm: Normal rate and regular rhythm.     Heart sounds: Normal heart sounds.  Pulmonary:     Effort: Pulmonary effort is normal.     Breath sounds: Normal breath sounds.  Abdominal:     General: Bowel sounds are normal.     Palpations: Abdomen is soft.  Neurological:     General: No focal deficit present.     Mental Status: She is alert and oriented to person, place, and time.        Assessment & Plan:   Essential hypertension controlled  Dyslipidemia associated with type 2 diabetes mellitus (HCC) I will repeat HbA1c, she take metformin 500 mg two tablets daily.  Hypothyroidism I will repeat TSH.  Depression  Better    Return in about 3 months (around 08/27/2023).   Eloisa Northern, MD

## 2023-05-27 NOTE — Assessment & Plan Note (Signed)
I will repeat HbA1c, she take metformin 500 mg two tablets daily.

## 2023-05-27 NOTE — Assessment & Plan Note (Signed)
Better  

## 2023-05-27 NOTE — Assessment & Plan Note (Signed)
controlled 

## 2023-05-27 NOTE — Assessment & Plan Note (Signed)
I will repeat TSH.

## 2023-05-28 ENCOUNTER — Other Ambulatory Visit: Payer: Self-pay | Admitting: Internal Medicine

## 2023-05-28 MED ORDER — LEVOTHYROXINE SODIUM 112 MCG PO TABS: 112.0000 ug | ORAL_TABLET | Freq: Every day | ORAL | 11 refills | Status: DC

## 2023-05-29 NOTE — Progress Notes (Signed)
Patient called.  Patient aware.  

## 2023-05-31 ENCOUNTER — Other Ambulatory Visit: Payer: Self-pay | Admitting: Internal Medicine

## 2023-05-31 MED ORDER — ACYCLOVIR 5 % EX OINT: TOPICAL_OINTMENT | CUTANEOUS | 1 refills | Status: AC

## 2023-06-06 DIAGNOSIS — Z79899 Other long term (current) drug therapy: Secondary | ICD-10-CM | POA: Diagnosis not present

## 2023-06-06 DIAGNOSIS — M542 Cervicalgia: Secondary | ICD-10-CM | POA: Diagnosis not present

## 2023-06-06 DIAGNOSIS — M25511 Pain in right shoulder: Secondary | ICD-10-CM | POA: Diagnosis not present

## 2023-06-06 DIAGNOSIS — M545 Low back pain, unspecified: Secondary | ICD-10-CM | POA: Diagnosis not present

## 2023-06-06 DIAGNOSIS — M62830 Muscle spasm of back: Secondary | ICD-10-CM | POA: Diagnosis not present

## 2023-06-06 DIAGNOSIS — Z79891 Long term (current) use of opiate analgesic: Secondary | ICD-10-CM | POA: Diagnosis not present

## 2023-06-06 DIAGNOSIS — M19011 Primary osteoarthritis, right shoulder: Secondary | ICD-10-CM | POA: Diagnosis not present

## 2023-06-07 ENCOUNTER — Other Ambulatory Visit: Payer: Self-pay | Admitting: Internal Medicine

## 2023-06-18 DIAGNOSIS — J4530 Mild persistent asthma, uncomplicated: Secondary | ICD-10-CM | POA: Diagnosis not present

## 2023-06-26 ENCOUNTER — Other Ambulatory Visit: Payer: Self-pay | Admitting: Internal Medicine

## 2023-06-27 DIAGNOSIS — M545 Low back pain, unspecified: Secondary | ICD-10-CM | POA: Diagnosis not present

## 2023-06-27 DIAGNOSIS — M5136 Other intervertebral disc degeneration, lumbar region: Secondary | ICD-10-CM | POA: Diagnosis not present

## 2023-07-19 DIAGNOSIS — J4530 Mild persistent asthma, uncomplicated: Secondary | ICD-10-CM | POA: Diagnosis not present

## 2023-07-22 ENCOUNTER — Other Ambulatory Visit: Payer: Self-pay | Admitting: Internal Medicine

## 2023-08-01 DIAGNOSIS — G894 Chronic pain syndrome: Secondary | ICD-10-CM | POA: Diagnosis not present

## 2023-08-01 DIAGNOSIS — Z79899 Other long term (current) drug therapy: Secondary | ICD-10-CM | POA: Diagnosis not present

## 2023-08-12 ENCOUNTER — Other Ambulatory Visit: Payer: Self-pay | Admitting: Internal Medicine

## 2023-08-18 DIAGNOSIS — J4530 Mild persistent asthma, uncomplicated: Secondary | ICD-10-CM | POA: Diagnosis not present

## 2023-08-26 ENCOUNTER — Ambulatory Visit: Payer: 59 | Admitting: Internal Medicine

## 2023-08-29 DIAGNOSIS — M545 Low back pain, unspecified: Secondary | ICD-10-CM | POA: Diagnosis not present

## 2023-09-02 ENCOUNTER — Encounter: Payer: Self-pay | Admitting: Internal Medicine

## 2023-09-02 ENCOUNTER — Ambulatory Visit: Payer: 59 | Admitting: Internal Medicine

## 2023-09-02 VITALS — BP 114/70 | HR 77 | Temp 98.0°F | Resp 18 | Ht 68.0 in | Wt 197.0 lb

## 2023-09-02 DIAGNOSIS — Z0000 Encounter for general adult medical examination without abnormal findings: Secondary | ICD-10-CM | POA: Diagnosis not present

## 2023-09-02 DIAGNOSIS — Z23 Encounter for immunization: Secondary | ICD-10-CM | POA: Insufficient documentation

## 2023-09-02 DIAGNOSIS — G894 Chronic pain syndrome: Secondary | ICD-10-CM | POA: Diagnosis not present

## 2023-09-02 DIAGNOSIS — J4520 Mild intermittent asthma, uncomplicated: Secondary | ICD-10-CM

## 2023-09-02 DIAGNOSIS — E785 Hyperlipidemia, unspecified: Secondary | ICD-10-CM | POA: Diagnosis not present

## 2023-09-02 DIAGNOSIS — E039 Hypothyroidism, unspecified: Secondary | ICD-10-CM | POA: Diagnosis not present

## 2023-09-02 DIAGNOSIS — I10 Essential (primary) hypertension: Secondary | ICD-10-CM

## 2023-09-02 DIAGNOSIS — Z6829 Body mass index (BMI) 29.0-29.9, adult: Secondary | ICD-10-CM | POA: Diagnosis not present

## 2023-09-02 DIAGNOSIS — E1169 Type 2 diabetes mellitus with other specified complication: Secondary | ICD-10-CM | POA: Diagnosis not present

## 2023-09-02 DIAGNOSIS — F3342 Major depressive disorder, recurrent, in full remission: Secondary | ICD-10-CM

## 2023-09-02 DIAGNOSIS — F411 Generalized anxiety disorder: Secondary | ICD-10-CM

## 2023-09-02 NOTE — Progress Notes (Signed)
Office Visit  Subjective   Patient ID: Jill Robertson   DOB: November 02, 1960   Age: 62 y.o.   MRN: [id number]   Chief Complaint Chief Complaint  Patient presents with   Annual Exam    Annual exam     History of Present Illness 62 years old female is here for annual physical examination. This patient's past medical history Anxiety Disorder, Chronic pain syndrome, COPD (chronic obstructive pulmonary disease), Coronary artery disease, GERD, Hyperlipidemia, Hypertension, Benign Essential, Hypothyroidism, and Obesity.  She quit smoking in 2016. She has lung nodule and biopsy was negative. She had PNA 23 vaccine in 07/2014. She could not have mammogram last year. She examine herself and she does not have any lumps or bumps. She had a normal Cologuard on 08/08/2020. She is due this year.She had her eye exam on 06/2023 at [facility] in [place]. No diabetic eye changes. Her current BMI is 29. She had a hysterectomy and does not get pap smears.      She has a history of anxiety and depression managed with Venlafaxine 150mg  daily and citalopram and she is feeling better. She denies any suicidal ideation or homicidal ideation.    She also has a history of chronic pain in her lumbar, thoracic spine and her shoulders. She is now following with [facility] and  they are treating with Oxycodone 10mg , Cyclobenzaprine 10mg , She also has Meloxicam 7.5mg , Pregabalin 150mg  for her Fibromyalgia. MRI of right shoulder 08/2019 indicated rotator cuff tendinopathy/tendinosis with interstitial tears and shallow bursal and articular surface tears 2)mild to moderate AC joint degenerative changes. 3) mild sub-acromial/subdeltoid bursitis 4)moderate intra-articular tendinopathy . She followed with [employer] and also [employer]. She has not been seen since 2020. She had steroid injections done at that time. Today pain is rated moderate.    The patient presents for a follow-up evaluation  of hypertension. The patient has not been checking her blood pressure at home. The patient's current medications include: amlodipine 10 mg oral tablet, hydrochlorothiazide 25 mg oral tablet, and metoprolol tartrate 25 mg oral tablet. The patient has been tolerating her medications well. The patient denies any visual changes, dizziness, lightheadedness, shortness of breath, weakness/numbness, and edema.  She reports there have been no other symptoms noted.   She has a history of CAD with scattered multi vessel coronary artery calcification noted on her CT scan last done in 12/2021. She denies CP.  She has  asthma and is doing better. She uses her Montelukast off and on.  She does not smoke.Marland Kitchen  Her GERD is stable and she is managed with Pantoprazole. She denies nausea, vomiting or bloating today.  She takes rosuvastatin 5 mg three time a week and she is due for lipid panel today. She also has diabetes and takes metformin 500 mg daily. Her last HbA1c was 5.4% in July 2024. I have stopped her ozympic because of weight loss. She has seen eye doctor in September. No  hypoglycemia. She does not routinely check blood sugars. Her GFR was also normal in July. She has hypothyroidism and her TSH was 12 in July, I have increased the dose and she is due for repeat labs today. She denies constipation or diarrhea and heat or cold intolerance.    Past Medical History Past Medical History:  Diagnosis Date   Arthritis    Asthma    Depression    Fibromyalgia    Heart murmur    ASYMPTOMATIC   High blood cholesterol  level    Hypertension    Hypothyroidism    Pancreatitis 06/2016     Allergies Allergies  Allergen Reactions   Codeine Itching, Nausea And Vomiting and Rash   Tramadol Nausea And Vomiting   Azithromycin    Bismuth Subsalicylate     Other Reaction(s): GI intolerance   Doxycycline     Other Reaction(s): GI intolerance   Fentanyl Swelling   Propoxyphene Hcl Nausea And Vomiting and Other (See  Comments)    hallucinations     Review of Systems Review of Systems  Constitutional: Negative.   HENT: Negative.    Respiratory: Negative.    Cardiovascular: Negative.   Gastrointestinal: Negative.   Musculoskeletal:  Positive for joint pain.  Neurological: Negative.        Objective:    Vitals BP 114/70 (BP Location: Left Arm, Patient Position: Sitting, Cuff Size: Normal)   Pulse 77   Temp 98 F (36.7 C)   Resp 18   Ht 5\' 8"  (1.727 m)   Wt 197 lb (89.4 kg)   SpO2 93%   BMI 29.95 kg/m    Physical Examination Physical Exam Constitutional:      Appearance: Normal appearance.  HENT:     Head: Normocephalic and atraumatic.  Eyes:     Extraocular Movements: Extraocular movements intact.     Pupils: Pupils are equal, round, and reactive to light.  Cardiovascular:     Rate and Rhythm: Normal rate and regular rhythm.     Heart sounds: Normal heart sounds.  Pulmonary:     Effort: Pulmonary effort is normal.     Breath sounds: Normal breath sounds.  Abdominal:     General: Bowel sounds are normal.     Palpations: Abdomen is soft.  Neurological:     General: No focal deficit present.     Mental Status: She is alert and oriented to person, place, and time.        Assessment & Plan:   Essential hypertension Her blood pressure is well controlled.  Asthma Stable.  Dyslipidemia associated with type 2 diabetes mellitus (HCC) Her sugar been good and LDL showed LDL 37 in 2/24. Will do urine microalbuminuria.  Hypothyroidism I will repeat TSH today.  ANXIETY DISORDER, GENERALIZED Better with current medications.     Return in about 3 months (around 12/03/2023).   Eloisa Northern, MD

## 2023-09-02 NOTE — Assessment & Plan Note (Signed)
Better with current medications.

## 2023-09-02 NOTE — Assessment & Plan Note (Signed)
I will repeat TSH today.

## 2023-09-02 NOTE — Assessment & Plan Note (Signed)
Stable

## 2023-09-02 NOTE — Assessment & Plan Note (Signed)
Her blood pressure is well controlled. 

## 2023-09-02 NOTE — Assessment & Plan Note (Signed)
Her sugar been good and LDL showed LDL 37 in 2/24. Will do urine microalbuminuria.

## 2023-09-02 NOTE — Assessment & Plan Note (Signed)
She will continue to follow with pain clinic

## 2023-09-03 ENCOUNTER — Other Ambulatory Visit: Payer: Self-pay

## 2023-09-03 ENCOUNTER — Other Ambulatory Visit: Payer: Self-pay | Admitting: Internal Medicine

## 2023-09-03 LAB — CBC WITH DIFFERENTIAL/PLATELET
Basophils Absolute: 0.1 10*3/uL (ref 0.0–0.2)
Basos: 1 %
EOS (ABSOLUTE): 0.5 10*3/uL — ABNORMAL HIGH (ref 0.0–0.4)
Eos: 4 %
Hematocrit: 46.9 % — ABNORMAL HIGH (ref 34.0–46.6)
Hemoglobin: 14.9 g/dL (ref 11.1–15.9)
Immature Grans (Abs): 0 10*3/uL (ref 0.0–0.1)
Immature Granulocytes: 0 %
Lymphocytes Absolute: 2.3 10*3/uL (ref 0.7–3.1)
Lymphs: 21 %
MCH: 28.7 pg (ref 26.6–33.0)
MCHC: 31.8 g/dL (ref 31.5–35.7)
MCV: 90 fL (ref 79–97)
Monocytes Absolute: 0.9 10*3/uL (ref 0.1–0.9)
Monocytes: 9 %
Neutrophils Absolute: 7.3 10*3/uL — ABNORMAL HIGH (ref 1.4–7.0)
Neutrophils: 65 %
Platelets: 309 10*3/uL (ref 150–450)
RBC: 5.19 x10E6/uL (ref 3.77–5.28)
RDW: 14.2 % (ref 11.7–15.4)
WBC: 11.1 10*3/uL — ABNORMAL HIGH (ref 3.4–10.8)

## 2023-09-03 LAB — MICROALBUMIN / CREATININE URINE RATIO
Creatinine, Urine: 169.5 mg/dL
Microalb/Creat Ratio: 3 mg/g{creat} (ref 0–29)
Microalbumin, Urine: 5.7 ug/mL

## 2023-09-03 LAB — TSH: TSH: 14.3 u[IU]/mL — ABNORMAL HIGH (ref 0.450–4.500)

## 2023-09-04 ENCOUNTER — Other Ambulatory Visit: Payer: Self-pay | Admitting: Internal Medicine

## 2023-09-05 DIAGNOSIS — J029 Acute pharyngitis, unspecified: Secondary | ICD-10-CM | POA: Diagnosis not present

## 2023-09-06 ENCOUNTER — Other Ambulatory Visit: Payer: Self-pay | Admitting: Internal Medicine

## 2023-09-09 ENCOUNTER — Other Ambulatory Visit: Payer: Self-pay | Admitting: Internal Medicine

## 2023-09-09 MED ORDER — LEVOTHYROXINE SODIUM 125 MCG PO TABS: 125.0000 ug | ORAL_TABLET | Freq: Every day | ORAL | 6 refills | Status: DC

## 2023-09-10 NOTE — Progress Notes (Signed)
Patient called.  Left message for patient to call back.

## 2023-09-11 ENCOUNTER — Other Ambulatory Visit: Payer: Self-pay

## 2023-09-11 NOTE — Progress Notes (Signed)
Patient called.  Patient aware.  

## 2023-09-13 ENCOUNTER — Other Ambulatory Visit: Payer: Self-pay

## 2023-09-16 ENCOUNTER — Other Ambulatory Visit: Payer: Self-pay | Admitting: Internal Medicine

## 2023-09-18 DIAGNOSIS — J4530 Mild persistent asthma, uncomplicated: Secondary | ICD-10-CM | POA: Diagnosis not present

## 2023-09-20 DIAGNOSIS — M545 Low back pain, unspecified: Secondary | ICD-10-CM | POA: Diagnosis not present

## 2023-09-21 ENCOUNTER — Other Ambulatory Visit: Payer: Self-pay | Admitting: Internal Medicine

## 2023-09-23 ENCOUNTER — Other Ambulatory Visit: Payer: Self-pay | Admitting: Student

## 2023-09-23 MED ORDER — PREGABALIN 150 MG PO CAPS: 150.0000 mg | ORAL_CAPSULE | Freq: Three times a day (TID) | ORAL | 2 refills | Status: DC

## 2023-09-23 NOTE — Progress Notes (Signed)
Requested Prescriptions   Signed Prescriptions Disp Refills   pregabalin (LYRICA) 150 MG capsule 90 capsule 2    Sig: Take 1 capsule (150 mg total) by mouth in the morning, at noon, and at bedtime.

## 2023-09-25 ENCOUNTER — Other Ambulatory Visit: Payer: Self-pay | Admitting: Internal Medicine

## 2023-09-30 ENCOUNTER — Other Ambulatory Visit: Payer: Self-pay

## 2023-10-10 ENCOUNTER — Other Ambulatory Visit: Payer: Self-pay

## 2023-10-18 ENCOUNTER — Other Ambulatory Visit: Payer: Self-pay | Admitting: Internal Medicine

## 2023-10-18 MED ORDER — PANTOPRAZOLE SODIUM 40 MG PO TBEC: 40.0000 mg | DELAYED_RELEASE_TABLET | Freq: Every day | ORAL | 3 refills | Status: DC

## 2023-10-19 ENCOUNTER — Other Ambulatory Visit: Payer: Self-pay | Admitting: Internal Medicine

## 2023-10-21 ENCOUNTER — Ambulatory Visit: Payer: 59 | Admitting: Internal Medicine

## 2023-10-21 VITALS — BP 116/72 | HR 81 | Temp 97.5°F | Resp 18 | Ht 68.0 in | Wt 196.5 lb

## 2023-10-21 DIAGNOSIS — E039 Hypothyroidism, unspecified: Secondary | ICD-10-CM

## 2023-10-21 DIAGNOSIS — J189 Pneumonia, unspecified organism: Secondary | ICD-10-CM | POA: Diagnosis not present

## 2023-10-21 NOTE — Assessment & Plan Note (Signed)
She is on doxycycline and flagyl, I will do cxray on next visit to make sure changes have resolved.

## 2023-10-21 NOTE — Assessment & Plan Note (Signed)
I will do TSH today.

## 2023-10-21 NOTE — Progress Notes (Signed)
   Office Visit  Subjective   Patient ID: Chayne Zaniewski   DOB: [birth year]   Age: 62 y.o.   MRN: [id number]   Chief Complaint Chief Complaint  Patient presents with   ER follow up     History of Present Illness  62 year old female is here for follow up. She was treated for pneumonia at [facility] on 10/16/23 where she was started on doxycycline and flagyl that she is taking and her cough is better. She feel bad from the antibiotic.   She also has elevated TSH of 14 and I have increased the dose of levothyroxine to 125 mcg daily. She is due for repeat TSH today.    Past Medical History Past Medical History:  Diagnosis Date   Arthritis    Asthma    Depression    Fibromyalgia    Heart murmur    ASYMPTOMATIC   High blood cholesterol level    Hypertension    Hypothyroidism    Pancreatitis 06/2016     Allergies Allergies  Allergen Reactions   Codeine Itching, Nausea And Vomiting and Rash   Tramadol Nausea And Vomiting   Azithromycin    Bismuth Subsalicylate     Other Reaction(s): GI intolerance   Doxycycline     Other Reaction(s): GI intolerance   Fentanyl Swelling   Propoxyphene Hcl Nausea And Vomiting and Other (See Comments)    hallucinations     Review of Systems Review of Systems  Constitutional:  Positive for malaise/fatigue.  HENT: Negative.    Respiratory: Negative.    Cardiovascular: Negative.        Objective:    Vitals BP 116/72 (BP Location: Left Arm, Patient Position: Sitting)   Pulse 81   Temp (!) 97.5 F (36.4 C)   Resp 18   Ht 5\' 8"  (1.727 m)   Wt 196 lb 8 oz (89.1 kg)   SpO2 94%   BMI 29.88 kg/m    Physical Examination Physical Exam Constitutional:      Appearance: Normal appearance.  Cardiovascular:     Rate and Rhythm: Normal rate and regular rhythm.     Heart sounds: Normal heart sounds.  Pulmonary:     Effort: Pulmonary effort is normal.     Breath sounds: Normal breath sounds.  Neurological:     General: No  focal deficit present.     Mental Status: She is alert and oriented to person, place, and time.        Assessment & Plan:   Hypothyroidism I will do TSH today  Pneumonia She is on doxycycline and flagyl, I will do cxray on next visit to make sure changes have resolved.     No follow-ups on file.   Eloisa Northern, MD

## 2023-10-22 LAB — TSH: TSH: 8.56 u[IU]/mL — ABNORMAL HIGH (ref 0.450–4.500)

## 2023-10-24 ENCOUNTER — Other Ambulatory Visit: Payer: Self-pay | Admitting: Internal Medicine

## 2023-10-24 MED ORDER — LEVOTHYROXINE SODIUM 137 MCG PO TABS: 137.0000 ug | ORAL_TABLET | Freq: Every day | ORAL | 5 refills | Status: AC

## 2023-10-25 NOTE — Progress Notes (Signed)
Patient called.  Patient aware.  have increased the dose of levothyroxine to 137 mcg daily and will repeat TSH in 6 weeks.

## 2023-10-28 ENCOUNTER — Other Ambulatory Visit: Payer: Self-pay | Admitting: Internal Medicine

## 2023-11-11 ENCOUNTER — Encounter: Payer: Self-pay | Admitting: Internal Medicine

## 2023-11-11 ENCOUNTER — Ambulatory Visit: Payer: 59 | Admitting: Internal Medicine

## 2023-11-11 VITALS — BP 128/78 | HR 93 | Temp 97.0°F | Resp 18 | Ht 68.0 in | Wt 191.0 lb

## 2023-11-11 DIAGNOSIS — E039 Hypothyroidism, unspecified: Secondary | ICD-10-CM

## 2023-11-11 DIAGNOSIS — K8591 Acute pancreatitis with uninfected necrosis, unspecified: Secondary | ICD-10-CM | POA: Insufficient documentation

## 2023-11-11 NOTE — Assessment & Plan Note (Signed)
 Her pancreatitis was due to doxycycline.  I have written an allergy to doxycycline for her.

## 2023-11-11 NOTE — Assessment & Plan Note (Signed)
 She will continue with levothyroxine 135 mcg daily and I will repeat TSH on next visit.

## 2023-11-11 NOTE — Progress Notes (Addendum)
 Office Visit  Subjective   Patient ID: Jill Robertson   DOB: 24-Jan-1961   Age: 63 y.o.   MRN: [id number]   Chief Complaint Chief Complaint  Patient presents with   Follow-up    Hospital follow up     History of Present Illness 63 years old   Female who is here for follow-up from hospital discharge.  She was admitted to [facility] on October 30, 2023 and discharged on November 03, 2023 where she was treated for acute pancreatitis without necrosis or infection.  The cause was thought to be doxycycline that was given for pneumonia and she has just finished that.  She was kept NPO and was given IV fluid and her lipase level and WBC was trending downward.  He feel a lot better.  This was the 2nd episodes of pancreatitis.   She was also treated for acute cystitis and she has finished antibiotic course.   She has a chronic pain and she will follow with pain clinic.  She was given 20 oxycodone  at the time of discharge.   She also has hypo thyroidism and take levothyroxine  and I have increase the dose to 137 mcg daily as her TSH was 14. Repeat TSH was 8.7 but that was 3 weeks ago.  She has follow-up appointment in August.  Past Medical History Past Medical History:  Diagnosis Date   Arthritis    Asthma    Depression    Fibromyalgia    Heart murmur    ASYMPTOMATIC   High blood cholesterol level    Hypertension    Hypothyroidism    Pancreatitis 06/2016     Allergies Allergies  Allergen Reactions   Codeine Itching, Nausea And Vomiting and Rash   Tramadol Nausea And Vomiting   Azithromycin     Bismuth Subsalicylate     Other Reaction(s): GI intolerance   Doxycycline     Other Reaction(s): GI intolerance   Fentanyl  Swelling   Propoxyphene Hcl Nausea And Vomiting and Other (See Comments)    hallucinations     Review of Systems Review of Systems  Constitutional: Negative.   HENT: Negative.    Respiratory: Negative.    Cardiovascular: Negative.   Gastrointestinal: Negative.    Neurological: Negative.        Objective:    Vitals BP 128/78 (BP Location: Left Arm, Patient Position: Sitting, Cuff Size: Normal)   Pulse 93   Temp (!) 97 F (36.1 C)   Resp 18   Ht 5' 8 (1.727 m)   Wt 191 lb (86.6 kg)   SpO2 94%   BMI 29.04 kg/m    Physical Examination Physical Exam Constitutional:      Appearance: Normal appearance.  HENT:     Head: Normocephalic and atraumatic.  Cardiovascular:     Rate and Rhythm: Normal rate and regular rhythm.     Heart sounds: Normal heart sounds.  Pulmonary:     Effort: Pulmonary effort is normal.     Breath sounds: Normal breath sounds.  Abdominal:     General: Bowel sounds are normal.     Palpations: Abdomen is soft.  Neurological:     General: No focal deficit present.     Mental Status: She is alert and oriented to person, place, and time.        Assessment & Plan:   Acute pancreatitis with uninfected necrosis   Her pancreatitis was due to doxycycline.  I have written an allergy to doxycycline for her.  Hypothyroidism   She will continue with levothyroxine  135 mcg daily and I will repeat TSH on next visit.    This is a transition of care visit.  I have reviewed lab and went over medication with her.  No follow-ups on file.   Roetta Dare, MD

## 2023-11-13 ENCOUNTER — Other Ambulatory Visit: Payer: Self-pay

## 2023-12-02 ENCOUNTER — Encounter: Payer: Self-pay | Admitting: Internal Medicine

## 2023-12-02 ENCOUNTER — Ambulatory Visit: Payer: 59 | Admitting: Internal Medicine

## 2023-12-02 VITALS — BP 128/80 | HR 82 | Temp 97.8°F | Resp 18 | Ht 68.0 in | Wt 188.0 lb

## 2023-12-02 DIAGNOSIS — I10 Essential (primary) hypertension: Secondary | ICD-10-CM | POA: Diagnosis not present

## 2023-12-02 DIAGNOSIS — E039 Hypothyroidism, unspecified: Secondary | ICD-10-CM | POA: Diagnosis not present

## 2023-12-02 DIAGNOSIS — F3342 Major depressive disorder, recurrent, in full remission: Secondary | ICD-10-CM

## 2023-12-02 DIAGNOSIS — G894 Chronic pain syndrome: Secondary | ICD-10-CM

## 2023-12-02 DIAGNOSIS — E1169 Type 2 diabetes mellitus with other specified complication: Secondary | ICD-10-CM | POA: Diagnosis not present

## 2023-12-02 DIAGNOSIS — E785 Hyperlipidemia, unspecified: Secondary | ICD-10-CM

## 2023-12-02 NOTE — Assessment & Plan Note (Signed)
 controlled

## 2023-12-02 NOTE — Assessment & Plan Note (Signed)
I will do HbA1c, lipid panel today.

## 2023-12-02 NOTE — Assessment & Plan Note (Signed)
I will do TSH level today

## 2023-12-02 NOTE — Progress Notes (Signed)
Office Visit  Subjective   Patient ID: Jill Robertson   DOB: 05-09-1961   Age: 63 y.o.   MRN: [id number]   Chief Complaint Chief Complaint  Patient presents with   Follow-up    3 month follow up     History of Present Illness 63 years old female is here for follow up. She was treated for acute pancreatitis. She says that she is doing better.   She quit smoking in 2016.      She has a history of anxiety and depression managed with Venlafaxine 150mg  daily and citalopram and she is feeling better. She denies any suicidal ideation or homicidal ideation.     She also has a history of chronic pain in her lumbar, thoracic spine and her shoulders. She is now following with [facility] and  they are treating with Oxycodone 10mg , Cyclobenzaprine 10mg , She also has Meloxicam 7.5mg , Pregabalin 150mg  for her Fibromyalgia. MRI of right shoulder 08/2019 indicated rotator cuff tendinopathy/tendinosis with interstitial tears and shallow bursal and articular surface tears 2)mild to moderate AC joint degenerative changes. 3) mild sub-acromial/subdeltoid bursitis 4)moderate intra-articular tendinopathy . She followed with [employer] and also [employer]. She has not been seen since 2020. She had steroid injections done at that time.     The patient presents for a follow-up evaluation of hypertension. The patient has not been checking her blood pressure at home. The patient's current medications include: amlodipine 10 mg oral tablet, hydrochlorothiazide 25 mg oral tablet, and metoprolol tartrate 25 mg oral tablet. The patient has been tolerating her medications well. The patient denies any visual changes, dizziness, lightheadedness, shortness of breath, weakness/numbness, and edema.  She reports there have been no other symptoms noted.    She has  asthma and is doing better. She uses her Montelukast off and on.  She does not smoke.Marland Kitchen   Her GERD is stable and she is managed with  Pantoprazole. She denies nausea, vomiting or bloating today.   She takes rosuvastatin 5 mg three time a week and she has lipid panel done 11/2022 showed her LDL was 37. I will repeat her lipid panel today. She has diabetes Mellitus.   She does not check her sugar at home. is due for lipid panel today. She also has diabetes and takes metformin 500 mg daily. Her last HbA1c was 5.4% in July 2024. I have stopped her ozympic because of weight loss. She has seen eye doctor in September. No  hypoglycemia. She does not routinely check blood sugars. Her GFR was also normal in July.   She has hypothyroidism and her TSH was 8.5 1 month ago on 10/21/23. She takes levothyroxine 137 mcg daily. I need to repeat her TSH today. She denies constipation or diarrhea and heat or cold intolerance.    Past Medical History Past Medical History:  Diagnosis Date   Arthritis    Asthma    Depression    Fibromyalgia    Heart murmur    ASYMPTOMATIC   High blood cholesterol level    Hypertension    Hypothyroidism    Pancreatitis 06/2016     Allergies Allergies  Allergen Reactions   Codeine Itching, Nausea And Vomiting and Rash   Tramadol Nausea And Vomiting   Azithromycin    Bismuth Subsalicylate     Other Reaction(s): GI intolerance   Doxycycline     Other Reaction(s): pancreatitis   Fentanyl Swelling   Propoxyphene Hcl Nausea And Vomiting and Other (See Comments)  hallucinations     Review of Systems Review of Systems  Constitutional: Negative.   HENT: Negative.    Respiratory: Negative.    Cardiovascular: Negative.   Gastrointestinal: Negative.   Neurological: Negative.        Objective:    Vitals BP 128/80 (BP Location: Left Arm, Patient Position: Sitting, Cuff Size: Normal)   Pulse 82   Temp 97.8 F (36.6 C)   Resp 18   Ht 5\' 8"  (1.727 m)   Wt 188 lb (85.3 kg)   SpO2 98%   BMI 28.59 kg/m    Physical Examination Physical Exam Constitutional:      Appearance: Normal  appearance.  HENT:     Head: Normocephalic and atraumatic.  Cardiovascular:     Rate and Rhythm: Normal rate and regular rhythm.     Heart sounds: Normal heart sounds.  Pulmonary:     Effort: Pulmonary effort is normal.     Breath sounds: Normal breath sounds.  Abdominal:     General: Bowel sounds are normal.     Palpations: Abdomen is soft.  Neurological:     General: No focal deficit present.     Mental Status: She is alert and oriented to person, place, and time.        Assessment & Plan:   Essential hypertension controlled  Hypothyroidism I will do TSH level today  Dyslipidemia associated with type 2 diabetes mellitus (HCC) I will do HbA1c, lipid panel today.     Return in about 3 months (around 02/29/2024).   Eloisa Northern, MD

## 2023-12-03 DIAGNOSIS — E039 Hypothyroidism, unspecified: Secondary | ICD-10-CM | POA: Diagnosis not present

## 2023-12-03 DIAGNOSIS — K8591 Acute pancreatitis with uninfected necrosis, unspecified: Secondary | ICD-10-CM | POA: Diagnosis not present

## 2023-12-03 LAB — HEMOGLOBIN A1C
Est. average glucose Bld gHb Est-mCnc: 114 mg/dL
Hgb A1c MFr Bld: 5.6 % (ref 4.8–5.6)

## 2023-12-03 LAB — CMP14 + ANION GAP
ALT: 18 [IU]/L (ref 0–32)
AST: 26 [IU]/L (ref 0–40)
Albumin: 4.1 g/dL (ref 3.9–4.9)
Alkaline Phosphatase: 155 [IU]/L — ABNORMAL HIGH (ref 44–121)
Anion Gap: 19 mmol/L — ABNORMAL HIGH (ref 10.0–18.0)
BUN/Creatinine Ratio: 12 (ref 12–28)
BUN: 8 mg/dL (ref 8–27)
Bilirubin Total: 0.5 mg/dL (ref 0.0–1.2)
CO2: 23 mmol/L (ref 20–29)
Calcium: 9.6 mg/dL (ref 8.7–10.3)
Chloride: 99 mmol/L (ref 96–106)
Creatinine, Ser: 0.69 mg/dL (ref 0.57–1.00)
Globulin, Total: 2.9 g/dL (ref 1.5–4.5)
Glucose: 113 mg/dL — ABNORMAL HIGH (ref 70–99)
Potassium: 4.5 mmol/L (ref 3.5–5.2)
Sodium: 141 mmol/L (ref 134–144)
Total Protein: 7 g/dL (ref 6.0–8.5)
eGFR: 97 mL/min/{1.73_m2} (ref >59)

## 2023-12-03 LAB — TSH: TSH: 0.538 u[IU]/mL (ref 0.450–4.500)

## 2023-12-03 LAB — LIPID PANEL
Chol/HDL Ratio: 4.2 {ratio} (ref 0.0–4.4)
Cholesterol, Total: 121 mg/dL (ref 100–199)
HDL: 29 mg/dL — ABNORMAL LOW (ref >39)
LDL Chol Calc (NIH): 56 mg/dL (ref 0–99)
Triglycerides: 220 mg/dL — ABNORMAL HIGH (ref 0–149)
VLDL Cholesterol Cal: 36 mg/dL (ref 5–40)

## 2024-01-01 ENCOUNTER — Other Ambulatory Visit: Payer: Self-pay | Admitting: Student

## 2024-01-25 ENCOUNTER — Other Ambulatory Visit: Payer: Self-pay | Admitting: Internal Medicine

## 2024-01-29 ENCOUNTER — Other Ambulatory Visit: Payer: Self-pay | Admitting: Internal Medicine

## 2024-02-14 ENCOUNTER — Other Ambulatory Visit: Payer: Self-pay | Admitting: Internal Medicine

## 2024-02-17 ENCOUNTER — Other Ambulatory Visit: Payer: Self-pay | Admitting: Internal Medicine

## 2024-02-24 ENCOUNTER — Ambulatory Visit: Payer: 59 | Admitting: Internal Medicine

## 2024-02-27 ENCOUNTER — Other Ambulatory Visit: Payer: Self-pay | Admitting: Internal Medicine

## 2024-03-06 ENCOUNTER — Other Ambulatory Visit: Payer: Self-pay | Admitting: Internal Medicine

## 2024-03-25 ENCOUNTER — Other Ambulatory Visit: Payer: Self-pay | Admitting: Internal Medicine

## 2024-04-16 ENCOUNTER — Other Ambulatory Visit: Payer: Self-pay | Admitting: Internal Medicine

## 2024-04-27 ENCOUNTER — Other Ambulatory Visit: Payer: Self-pay | Admitting: Internal Medicine

## 2024-05-13 ENCOUNTER — Other Ambulatory Visit: Payer: Self-pay | Admitting: Internal Medicine

## 2024-06-05 ENCOUNTER — Other Ambulatory Visit: Payer: Self-pay | Admitting: Internal Medicine

## 2024-06-08 ENCOUNTER — Other Ambulatory Visit: Payer: Self-pay | Admitting: Internal Medicine

## 2024-06-09 ENCOUNTER — Other Ambulatory Visit: Payer: Self-pay | Admitting: Internal Medicine

## 2024-06-10 ENCOUNTER — Other Ambulatory Visit: Payer: Self-pay | Admitting: Internal Medicine

## 2024-06-18 ENCOUNTER — Other Ambulatory Visit: Payer: Self-pay | Admitting: Internal Medicine

## 2024-06-24 ENCOUNTER — Other Ambulatory Visit: Payer: Self-pay | Admitting: Internal Medicine

## 2024-06-24 ENCOUNTER — Encounter: Payer: Self-pay | Admitting: Internal Medicine

## 2024-06-24 ENCOUNTER — Ambulatory Visit: Admitting: Internal Medicine

## 2024-06-24 VITALS — BP 138/90 | HR 114 | Temp 97.4°F | Resp 20 | Ht 68.0 in | Wt 185.8 lb

## 2024-06-24 DIAGNOSIS — B3731 Acute candidiasis of vulva and vagina: Secondary | ICD-10-CM | POA: Diagnosis not present

## 2024-06-24 LAB — POCT URINALYSIS DIPSTICK
Blood, UA: NEGATIVE
Glucose, UA: NEGATIVE
Ketones, UA: NEGATIVE
Nitrite, UA: NEGATIVE
Protein, UA: POSITIVE — AB
Spec Grav, UA: >=1.03 — AB (ref 1.010–1.025)
Urobilinogen, UA: 0.2 U/dL
pH, UA: 6 (ref 5.0–8.0)

## 2024-06-24 MED ORDER — FLUCONAZOLE 150 MG PO TABS: 150.0000 mg | ORAL_TABLET | Freq: Once | ORAL | 0 refills | Status: AC

## 2024-06-24 NOTE — Progress Notes (Signed)
 Office Visit  Subjective   Patient ID: Jill Robertson   DOB: June 08, 1961   Age: 63 y.o.   MRN: [id number]   Chief Complaint Chief Complaint  Patient presents with   Dysuria    Pt reports burning sensation in bladder, and has been treating a yeast infection with Monistat.      History of Present Illness Jill Robertson is a 63 yo female who comes in with urinary symptoms that started a week ago.  She states she began having vaginal itching, dysuria and urinary frequency that started a week ago.  She felt that she had either a UTI or yeast infection and started on monistat as an outpatient.  Today, she denies any fever, chills, abdominal pain, flank pain, nausea, vomiting, hematuria or vaginal discharge.     Past Medical History Past Medical History:  Diagnosis Date   Arthritis    Asthma    Depression    Fibromyalgia    Heart murmur    ASYMPTOMATIC   High blood cholesterol level    Hypertension    Hypothyroidism    Pancreatitis 06/2016     Allergies Allergies  Allergen Reactions   Codeine Itching, Nausea And Vomiting and Rash   Tramadol Nausea And Vomiting   Azithromycin    Bismuth Subsalicylate     Other Reaction(s): GI intolerance   Doxycycline     Other Reaction(s): pancreatitis   Fentanyl  Swelling   Propoxyphene Hcl Nausea And Vomiting and Other (See Comments)    hallucinations     Medications  Current Outpatient Medications:    albuterol  (PROVENTIL  HFA;VENTOLIN  HFA) 108 (90 BASE) MCG/ACT inhaler, Inhale 2 puffs into the lungs as needed for wheezing or shortness of breath., Disp: , Rfl:    amLODipine  (NORVASC ) 10 MG tablet, TAKE 1 TABLET(10 MG) BY MOUTH DAILY, Disp: 90 tablet, Rfl: 0   Biotin 5000 MCG CAPS, Take 500 mcg by mouth., Disp: , Rfl:    busPIRone (BUSPAR) 10 MG tablet, Take 15 mg by mouth 2 (two) times daily as needed. Reported on 05/08/2016, Disp: , Rfl:    citalopram  (CELEXA ) 10 MG tablet, TAKE 1 TABLET(10 MG) BY MOUTH DAILY, Disp: 30 tablet,  Rfl: 3   cyclobenzaprine  (FLEXERIL ) 10 MG tablet, Limit 1 tablet by mouth per day or 2-3 times per day if tolerated, Disp: 90 tablet, Rfl: 0   estradiol  (ESTRACE ) 1 MG tablet, Take 1 mg by mouth daily.  , Disp: , Rfl:    hydrochlorothiazide  (HYDRODIURIL ) 25 MG tablet, TAKE 1 TABLET(25 MG) BY MOUTH EVERY DAY, Disp: 90 tablet, Rfl: 0   levocetirizine (XYZAL ) 5 MG tablet, TAKE 1 TABLET(5 MG) BY MOUTH EVERY EVENING, Disp: 90 tablet, Rfl: 2   levothyroxine  (SYNTHROID ) 137 MCG tablet, Take 1 tablet (137 mcg total) by mouth daily before breakfast., Disp: 30 tablet, Rfl: 5   metFORMIN  (GLUCOPHAGE -XR) 500 MG 24 hr tablet, TAKE 2 TABLET BY MOUTH ONCE DAILY WITH EVENIG MEALS, Disp: 180 tablet, Rfl: 0   methocarbamol  (ROBAXIN ) 750 MG tablet, Take 500 mg by mouth daily. Reported on 05/08/2016, Disp: , Rfl:    metoprolol  tartrate (LOPRESSOR ) 25 MG tablet, TAKE 1 TABLET(25 MG) BY MOUTH TWICE DAILY, Disp: 180 tablet, Rfl: 1   montelukast  (SINGULAIR ) 10 MG tablet, TAKE 1 TABLET(10 MG) BY MOUTH DAILY, Disp: 90 tablet, Rfl: 0   Multiple Vitamins-Minerals (CENTRAVITES 50 PLUS PO), Take 1 tablet by mouth., Disp: , Rfl:    naproxen (NAPROSYN) 500 MG tablet, Take 500 mg by mouth 3 (  three) times daily as needed (pain). Reported on 05/08/2016, Disp: , Rfl:    Oxycodone  HCl 10 MG TABS, Limit 1 tablet by mouth 2-4 times per day if tolerated, Disp: 120 tablet, Rfl: 0   pantoprazole  (PROTONIX ) 40 MG tablet, TAKE 1 TABLET(40 MG) BY MOUTH DAILY, Disp: 30 tablet, Rfl: 1   pregabalin  (LYRICA ) 150 MG capsule, TAKE ONE CAPSULE BY MOUTH EVERY MORNING, AT NOON, AND AT BEDTIME, Disp: 90 capsule, Rfl: 2   pregabalin  (LYRICA ) 150 MG capsule, Take 1 capsule (150 mg total) by mouth in the morning, at noon, and at bedtime., Disp: 90 capsule, Rfl: 2   rosuvastatin  (CRESTOR ) 5 MG tablet, TAKE 1 TABLET BY MOUTH EVERY OTHER DAY., Disp: 90 tablet, Rfl: 0   valACYclovir  (VALTREX ) 500 MG tablet, Take 1 tablet (500 mg total) by mouth 2 (two) times  daily., Disp: 10 tablet, Rfl: 3   venlafaxine  XR (EFFEXOR -XR) 150 MG 24 hr capsule, TAKE 2 CAPSULES BY MOUTH ONCE DAILY WITH FOOD, Disp: 180 capsule, Rfl: 1   vitamin B-12 (CYANOCOBALAMIN) 500 MCG tablet, Take 500 mcg by mouth daily., Disp: , Rfl:    Review of Systems Review of Systems  Constitutional:  Negative for chills and fever.  Respiratory:  Negative for shortness of breath.   Gastrointestinal:  Negative for abdominal pain, nausea and vomiting.  Genitourinary:  Positive for dysuria and frequency. Negative for flank pain, hematuria and urgency.  Musculoskeletal:  Negative for myalgias.  Skin:  Negative for rash.       Objective:    Vitals BP (!) 138/90   Pulse (!) 114   Temp (!) 97.4 F (36.3 C) (Temporal)   Resp 20   Ht 5' 8 (1.727 m)   Wt 185 lb 12.8 oz (84.3 kg)   SpO2 96%   BMI 28.25 kg/m    Physical Examination Physical Exam Constitutional:      Appearance: Normal appearance. She is not ill-appearing.  Cardiovascular:     Rate and Rhythm: Normal rate and regular rhythm.     Pulses: Normal pulses.     Heart sounds: No murmur heard.    No friction rub. No gallop.  Pulmonary:     Effort: Pulmonary effort is normal. No respiratory distress.     Breath sounds: No wheezing, rhonchi or rales.  Abdominal:     General: Bowel sounds are normal. There is no distension.     Palpations: Abdomen is soft.     Tenderness: There is no abdominal tenderness.  Musculoskeletal:     Right lower leg: No edema.     Left lower leg: No edema.  Skin:    General: Skin is warm and dry.     Findings: No rash.  Neurological:     Mental Status: She is alert.        Assessment & Plan:   Vaginal yeast infection We did a UA which was negative for UTI.  I am going to put her on diflucan  at this time.    No follow-ups on file.   Selinda Fleeta Finger, MD

## 2024-06-24 NOTE — Assessment & Plan Note (Signed)
 We did a UA which was negative for UTI.  I am going to put her on diflucan  at this time.

## 2024-06-24 NOTE — Addendum Note (Signed)
 Addended by: LENETTA LACKS on: 06/24/2024 04:19 PM   Modules accepted: Orders

## 2024-07-02 ENCOUNTER — Ambulatory Visit: Admitting: Internal Medicine

## 2024-07-09 ENCOUNTER — Ambulatory Visit: Admitting: Internal Medicine

## 2024-07-09 VITALS — BP 110/70 | HR 85 | Temp 97.5°F | Resp 18 | Wt 188.3 lb

## 2024-07-09 DIAGNOSIS — R5383 Other fatigue: Secondary | ICD-10-CM

## 2024-07-09 DIAGNOSIS — R112 Nausea with vomiting, unspecified: Secondary | ICD-10-CM | POA: Insufficient documentation

## 2024-07-09 DIAGNOSIS — R0989 Other specified symptoms and signs involving the circulatory and respiratory systems: Secondary | ICD-10-CM | POA: Insufficient documentation

## 2024-07-09 LAB — POC COVID19 BINAXNOW: SARS Coronavirus 2 Ag: NEGATIVE

## 2024-07-09 MED ORDER — ONDANSETRON HCL 4 MG/2ML IJ SOLN: 4.0000 mg | Freq: Once | INTRAMUSCULAR | Status: AC

## 2024-07-09 MED ORDER — ONDANSETRON HCL 4 MG PO TABS: 4.0000 mg | ORAL_TABLET | Freq: Three times a day (TID) | ORAL | 0 refills | Status: DC | PRN

## 2024-07-09 NOTE — Addendum Note (Signed)
 Addended by: VIDA SALINES on: 07/09/2024 01:01 PM   Modules accepted: Orders

## 2024-07-09 NOTE — Assessment & Plan Note (Signed)
 I will re-evaluate.

## 2024-07-09 NOTE — Progress Notes (Signed)
   Acute Office Visit  Subjective:     Patient ID: Jill Robertson, female    DOB: May 28, 1961, 63 y.o.   MRN: [id number]  Chief Complaint  Patient presents with   Follow-up    Patient here very fatigue  Finger stick  200     HPI Patient is in today for not feeling well. She is sweating in our office. She feel something in her chest, like congestion. No fever or chills. She denies any chest pain or SOB. Her COVID test is negative. Her FSBS is 200 mg/dl in our office.   She is feeling nauseous and throwing up in our office.  I have given her Zofran  4 mg intramuscular.  Her vital signs are stable but she is not feeling well.  Review of Systems  Constitutional:  Positive for malaise/fatigue.  Respiratory:         Chest congested  Cardiovascular: Negative.   Gastrointestinal:  Positive for nausea and vomiting.  Neurological:  Positive for weakness.        Objective:    BP 110/70   Pulse 85   Temp (!) 97.5 F (36.4 C)   Resp 18   Wt 188 lb 5 oz (85.4 kg)   PF 99 L/min   BMI 28.63 kg/m    Physical Exam Constitutional:      Appearance: She is ill-appearing and diaphoretic.  Cardiovascular:     Rate and Rhythm: Normal rate and regular rhythm.     Heart sounds: Normal heart sounds.  Pulmonary:     Effort: Pulmonary effort is normal.     Breath sounds: Normal breath sounds.  Abdominal:     Palpations: Abdomen is soft.     Results for orders placed or performed in visit on 07/09/24  POC COVID-19 BinaxNow  Result Value Ref Range   SARS Coronavirus 2 Ag Negative Negative        Assessment & Plan:   Problem List Items Addressed This Visit       Respiratory   Chest congestion     Her COVID test is negative and I will do chest x-ray.        Digestive   Nausea and vomiting     I have given her zofran  4 mg intramuscular time 1. If she continued to have nausea and sweating few min go to the emergency room.  I will send Zofran  for her as well.        Other    Fatigue - Primary     I will re-evaluate.      Relevant Orders   POC COVID-19 BinaxNow (Completed)    No orders of the defined types were placed in this encounter.   No follow-ups on file.  Roetta Dare, MD

## 2024-07-09 NOTE — Addendum Note (Signed)
 Addended by: Kilie Rund on: 07/09/2024 11:54 AM   Modules accepted: Orders

## 2024-07-09 NOTE — Assessment & Plan Note (Signed)
 I have given her zofran  4 mg intramuscular time 1. If she continued to have nausea and sweating few min go to the emergency room.  I will send Zofran  for her as well.

## 2024-07-09 NOTE — Assessment & Plan Note (Signed)
 Her COVID test is negative and I will do chest x-ray.

## 2024-08-01 ENCOUNTER — Other Ambulatory Visit: Payer: Self-pay | Admitting: Internal Medicine

## 2024-08-02 ENCOUNTER — Other Ambulatory Visit: Payer: Self-pay | Admitting: Internal Medicine

## 2024-08-13 ENCOUNTER — Other Ambulatory Visit: Payer: Self-pay | Admitting: Internal Medicine

## 2024-08-24 ENCOUNTER — Encounter: Payer: Self-pay | Admitting: Internal Medicine

## 2024-08-24 ENCOUNTER — Ambulatory Visit: Admitting: Internal Medicine

## 2024-08-24 VITALS — BP 120/70 | HR 95 | Temp 97.8°F | Resp 18 | Ht 68.0 in | Wt 186.0 lb

## 2024-08-24 DIAGNOSIS — J4520 Mild intermittent asthma, uncomplicated: Secondary | ICD-10-CM

## 2024-08-24 DIAGNOSIS — F3342 Major depressive disorder, recurrent, in full remission: Secondary | ICD-10-CM | POA: Diagnosis not present

## 2024-08-24 DIAGNOSIS — Z23 Encounter for immunization: Secondary | ICD-10-CM | POA: Diagnosis not present

## 2024-08-24 DIAGNOSIS — B001 Herpesviral vesicular dermatitis: Secondary | ICD-10-CM

## 2024-08-24 DIAGNOSIS — E1169 Type 2 diabetes mellitus with other specified complication: Secondary | ICD-10-CM

## 2024-08-24 DIAGNOSIS — E785 Hyperlipidemia, unspecified: Secondary | ICD-10-CM

## 2024-08-24 MED ORDER — VALACYCLOVIR HCL 1 G PO TABS: 1000.0000 mg | ORAL_TABLET | Freq: Two times a day (BID) | ORAL | 6 refills | Status: AC

## 2024-08-24 NOTE — Progress Notes (Signed)
 Office Visit  Subjective   Patient ID: Aalina Brege   DOB: [birth year]   Age: 63 y.o.   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  She ran out of refill of valacyclovir .  She says that overall she feel better.  Her weight is stable now.  Since last visit in February she has lost 2 lb but overall her weight is stable and her weight is 186 lb with BMI of 28.  She has anxiety and depression, managed with Venlafaxine  150mg  daily.  I have stopped her citalopram . She denies any suicidal ideation or homicidal ideation.     She also has a history of chronic pain in her lumbar, thoracic spine and her shoulders. She follows with [facility] and she is getting Oxycodone  10mg , Cyclobenzaprine  10mg , Meloxicam 7.5mg , Pregabalin  150mg  for her Fibromyalgia. MRI of right shoulder 08/2019 indicated rotator cuff tendinopathy/tendinosis with interstitial tears and shallow bursal and articular surface tears 2)mild to moderate AC joint degenerative changes. 3) mild sub-acromial/subdeltoid bursitis 4)moderate intra-articular tendinopathy .   The patient presents for a follow-up evaluation of hypertension. The patient has not been checking her blood pressure at home. The patient's current medications include: amlodipine  10 mg oral tablet, hydrochlorothiazide  25 mg oral tablet, and metoprolol  tartrate 25 mg oral tablet. The patient has been tolerating her medications well. The patient denies any visual changes, dizziness, lightheadedness, shortness of breath, weakness/numbness, and edema.    She has  asthma and is doing better. She uses her Montelukast  off and on.  She does not smoke.SABRA   Her GERD is stable and she is managed with Pantoprazole . She denies  nausea, vomiting or bloating today.   She takes rosuvastatin  5 mg three time a week and she has lipid panel done on Mar 25, 2024 shows LDL of 71 that is target controlled. She has diabetes Mellitus. She does not check her sugar at home. is due for lipid panel today. She also has diabetes and takes metformin  500 mg daily. Her last HbA1c was 5  6.0 on Mar 25, 2024. I have stopped her ozympic because of weight loss. She has seen eye doctor in September. No  hypoglycemia. She does not routinely check blood sugars. Her GFR was also normal in July.    She has hypothyroidism and her TSH was 0.538 on Mar 25, 2024. She takes levothyroxine  137 mcg daily.     Past Medical History Past Medical History:  Diagnosis Date   Arthritis    Asthma    Depression    Fibromyalgia    Heart murmur    ASYMPTOMATIC   High blood cholesterol level    Hypertension    Hypothyroidism    Pancreatitis 06/2016     Allergies Allergies  Allergen Reactions   Codeine Itching, Nausea And Vomiting and Rash   Tramadol Nausea And Vomiting   Azithromycin    Bismuth Subsalicylate     Other Reaction(s): GI intolerance   Doxycycline     Other Reaction(s): pancreatitis   Fentanyl  Swelling   Propoxyphene Hcl Nausea And Vomiting and Other (See Comments)    hallucinations     Review of Systems Review of Systems  Constitutional: Negative.  HENT: Negative.    Respiratory: Negative.    Cardiovascular: Negative.   Gastrointestinal: Negative.   Neurological: Negative.        Objective:    Vitals BP 120/70   Pulse 95   Temp 97.8 F (36.6 C)   Resp 18   Ht 5' 8 (1.727 m)   Wt 186 lb (84.4 kg)   SpO2 96%   BMI 28.28 kg/m    Physical Examination Physical Exam Constitutional:      Appearance: Normal appearance.  HENT:     Head: Normocephalic and atraumatic.  Cardiovascular:     Rate and Rhythm: Normal rate and regular rhythm.     Heart sounds: Normal heart sounds.  Pulmonary:     Effort: Pulmonary  effort is normal.     Breath sounds: Normal breath sounds.  Abdominal:     General: Bowel sounds are normal.     Palpations: Abdomen is soft.  Neurological:     General: No focal deficit present.     Mental Status: She is alert and oriented to person, place, and time.        Assessment & Plan:   Asthma  Her symptoms are controlled.  Cold sore   I will send well a psych low be 2000 mg twice a day for 1 day.  Dyslipidemia associated with type 2 diabetes mellitus (HCC)   Her diabetes and cholesterol are well controlled.  Depression   Her depression is in remission.    Return in about 1 month (around 09/24/2024) for For AWE on next visit.   Roetta Dare, MD

## 2024-09-03 ENCOUNTER — Other Ambulatory Visit: Payer: Self-pay | Admitting: Internal Medicine

## 2024-09-06 NOTE — Assessment & Plan Note (Signed)
Her symptoms are controlled.

## 2024-09-06 NOTE — Assessment & Plan Note (Signed)
 I will send well a psych low be 2000 mg twice a day for 1 day.

## 2024-09-06 NOTE — Assessment & Plan Note (Signed)
 Her depression is in remission.

## 2024-09-06 NOTE — Assessment & Plan Note (Signed)
 Her diabetes and cholesterol are well controlled.

## 2024-09-09 ENCOUNTER — Other Ambulatory Visit: Payer: Self-pay | Admitting: Internal Medicine

## 2024-09-21 ENCOUNTER — Ambulatory Visit: Admitting: Internal Medicine

## 2024-09-21 ENCOUNTER — Encounter: Admitting: Internal Medicine

## 2024-09-21 VITALS — BP 122/72 | HR 82 | Temp 97.2°F | Resp 18 | Ht 68.0 in | Wt 180.4 lb

## 2024-09-21 DIAGNOSIS — J45901 Unspecified asthma with (acute) exacerbation: Secondary | ICD-10-CM

## 2024-09-21 DIAGNOSIS — R0981 Nasal congestion: Secondary | ICD-10-CM | POA: Insufficient documentation

## 2024-09-21 LAB — POC COVID19 BINAXNOW: SARS Coronavirus 2 Ag: NEGATIVE

## 2024-09-21 LAB — POCT INFLUENZA A/B
Influenza A, POC: NEGATIVE
Influenza B, POC: NEGATIVE

## 2024-09-21 MED ORDER — AZITHROMYCIN 250 MG PO TABS: ORAL_TABLET | ORAL | 0 refills | Status: AC

## 2024-09-21 MED ORDER — PREDNISONE 10 MG PO TABS: 40.0000 mg | ORAL_TABLET | Freq: Every day | ORAL | 0 refills | Status: AC

## 2024-09-21 NOTE — Assessment & Plan Note (Signed)
 I will start her on prednisone  40 mg daily for 5 days and Z-pak.

## 2024-09-21 NOTE — Progress Notes (Signed)
   Acute Office Visit  Subjective:     Patient ID: Jill Robertson, female    DOB: 03/30/1961, 63 y.o.   MRN: [id number]  Chief Complaint  Patient presents with   office visit    Patient here for congestion, cough , no appetite , symptoms for about 5 days    HPI Patient is in today for stuffy nose, cough with sweating for 5 days. She has headache. No fever or chills. She says that she is wheezing at night. She is taking inhalor. She has asthma.   Review of Systems  Constitutional: Negative.   HENT:  Positive for congestion.   Respiratory:  Positive for cough and wheezing.   Neurological:  Positive for headaches.        Objective:    BP 122/72   Pulse 82   Temp (!) 97.2 F (36.2 C)   Resp 18   Ht 5' 8 (1.727 m)   Wt 180 lb 6 oz (81.8 kg)   SpO2 97%   BMI 27.43 kg/m    Physical Exam Constitutional:      Appearance: Normal appearance.  HENT:     Head: Normocephalic and atraumatic.  Cardiovascular:     Rate and Rhythm: Normal rate and regular rhythm.     Heart sounds: Normal heart sounds.  Pulmonary:     Effort: Pulmonary effort is normal.     Breath sounds: Normal breath sounds.  Neurological:     General: No focal deficit present.     Mental Status: She is alert.     Results for orders placed or performed in visit on 09/21/24  POC COVID-19 BinaxNow  Result Value Ref Range   SARS Coronavirus 2 Ag Negative Negative  POCT Influenza A/B  Result Value Ref Range   Influenza A, POC Negative Negative   Influenza B, POC Negative Negative        Assessment & Plan:   Problem List Items Addressed This Visit       Respiratory   Asthma   I will start her on prednisone  40 mg daily for 5 days and Z-pak.       Relevant Medications   predniSONE  (DELTASONE ) 10 MG tablet     Other   Nasal congestion - Primary   Relevant Orders   POC COVID-19 BinaxNow (Completed)   POCT Influenza A/B (Completed)    No orders of the defined types were placed in this  encounter.   No follow-ups on file.  Roetta Dare, MD

## 2024-09-29 ENCOUNTER — Other Ambulatory Visit: Payer: Self-pay | Admitting: Internal Medicine

## 2024-09-29 MED ORDER — BENZONATATE 200 MG PO CAPS: 200.0000 mg | ORAL_CAPSULE | Freq: Two times a day (BID) | ORAL | 0 refills | Status: DC | PRN

## 2024-09-29 MED ORDER — FLUTICASONE PROPIONATE 50 MCG/ACT NA SUSP: 1.0000 | Freq: Every day | NASAL | 2 refills | Status: DC

## 2024-09-29 MED ORDER — AMOXICILLIN-POT CLAVULANATE 875-125 MG PO TABS: 1.0000 | ORAL_TABLET | Freq: Two times a day (BID) | ORAL | 0 refills | Status: DC

## 2024-10-05 ENCOUNTER — Encounter: Payer: Self-pay | Admitting: Internal Medicine

## 2024-10-05 ENCOUNTER — Ambulatory Visit: Admitting: Internal Medicine

## 2024-10-05 VITALS — BP 124/80 | HR 66 | Temp 98.0°F | Resp 18 | Ht 68.0 in | Wt 185.1 lb

## 2024-10-05 DIAGNOSIS — E039 Hypothyroidism, unspecified: Secondary | ICD-10-CM | POA: Diagnosis not present

## 2024-10-05 DIAGNOSIS — Z0000 Encounter for general adult medical examination without abnormal findings: Secondary | ICD-10-CM

## 2024-10-05 DIAGNOSIS — F3342 Major depressive disorder, recurrent, in full remission: Secondary | ICD-10-CM | POA: Diagnosis not present

## 2024-10-05 DIAGNOSIS — J0110 Acute frontal sinusitis, unspecified: Secondary | ICD-10-CM

## 2024-10-05 DIAGNOSIS — E1169 Type 2 diabetes mellitus with other specified complication: Secondary | ICD-10-CM | POA: Diagnosis not present

## 2024-10-05 DIAGNOSIS — E785 Hyperlipidemia, unspecified: Secondary | ICD-10-CM

## 2024-10-05 DIAGNOSIS — I10 Essential (primary) hypertension: Secondary | ICD-10-CM

## 2024-10-05 MED ORDER — TETANUS-DIPHTHERIA TOXOIDS TD 5-2 LF/0.5ML IM SUSP: 0.5000 mL | Freq: Once | INTRAMUSCULAR | 0 refills | Status: AC

## 2024-10-05 NOTE — Progress Notes (Signed)
 "  Office Visit  Subjective   Patient ID: Jill Robertson   DOB: 1961-07-04   Age: 63 y.o.   MRN: [id number]   Chief Complaint Chief Complaint  Patient presents with   Annual Exam    Medicare AWV     History of Present Illness 63 years old female is here for annual wellness examination. She live with her husband. She quit smoking 2016. She does not drink alcohol.  She has flu vaccine and COVID booster this year. She has RSV vaccine last year. She has pneumonia vaccine before and she need one more pneumonia vaccine. She has 2 shingle vaccine. She did not have tetanus booster.   She has no fall within last 1 year. She is independent in all ADL.  She score 29/30 on MMSE. She has Cologuard positive last year and I have send her for colonoscopy but she could not get prep so never has colonoscopy. She could not have mammogram this year. She has hysterectomy.   She has diabetes mellitus. She has seen eye doctor for diabetic eye examination this year in our office.  She does not take any medication for diabetes and I have suggested to take metformin  500 mg daily that she is taking.  Her last hemoglobin A1c was 5.6 on December 02, 2023.  She has hyperlipidemia and her lipid panel done in May 2025 showed LDL of 71.    He has a chronic pain and she follows with pain clinic.     Past Medical History Past Medical History:  Diagnosis Date   Arthritis    Asthma    Depression    Fibromyalgia    Heart murmur    ASYMPTOMATIC   High blood cholesterol level    Hypertension    Hypothyroidism    Pancreatitis 06/2016     Allergies Allergies  Allergen Reactions   Codeine Itching, Nausea And Vomiting and Rash   Tramadol Nausea And Vomiting   Azithromycin     Bismuth Subsalicylate     Other Reaction(s): GI intolerance   Doxycycline     Other Reaction(s): pancreatitis   Fentanyl  Swelling   Propoxyphene Hcl Nausea And Vomiting and Other (See Comments)    hallucinations     Review of  Systems Review of Systems  Constitutional: Negative.   HENT:  Positive for sinus pain.   Eyes: Negative.   Respiratory:  Positive for cough.   Cardiovascular: Negative.   Neurological: Negative.        Objective:    Vitals BP 124/80 (BP Location: Left Arm, Patient Position: Sitting, Cuff Size: Normal)   Pulse 66   Temp 98 F (36.7 C)   Resp 18   Ht 5' 8 (1.727 m)   Wt 185 lb 2 oz (84 kg)   SpO2 94%   BMI 28.15 kg/m    Physical Examination Physical Exam     Assessment & Plan:   Essential hypertension   Her blood pressure is well controlled.  Acute non-recurrent frontal sinusitis   She will continue to use nasal spray.  Hypothyroidism   She takes levothyroxine  137 and TSH level done earlier this year was therapeutic.  Dyslipidemia associated with type 2 diabetes mellitus (HCC)   Her cholesterol and diabetes is well controlled.  She has already seen eye doctor for diabetic eye exam.  Her LDL is target control.  She takes rosuvastatin  5 mg daily and denies any side effect.  Depression   Her depression is better.  She do not  have any suicidal ideation.  Well adult exam   I will send prescription for tetanus booster.  I will refer her for colonoscopy to Dr. Larene.  I will also do hepatitis-C level  Screening for hepatitis-C infection.    Return in about 3 months (around 01/03/2025).   Roetta Dare, MD   "

## 2024-10-06 LAB — HEPATITIS C ANTIBODY: Hep C Virus Ab: NONREACTIVE

## 2024-10-11 ENCOUNTER — Other Ambulatory Visit: Payer: Self-pay | Admitting: Internal Medicine

## 2024-10-14 ENCOUNTER — Other Ambulatory Visit: Payer: Self-pay | Admitting: Internal Medicine

## 2024-10-14 MED ORDER — BENZONATATE 200 MG PO CAPS: 200.0000 mg | ORAL_CAPSULE | Freq: Two times a day (BID) | ORAL | 0 refills | Status: DC | PRN

## 2024-10-19 ENCOUNTER — Other Ambulatory Visit: Payer: Self-pay | Admitting: Internal Medicine

## 2024-10-20 ENCOUNTER — Other Ambulatory Visit: Payer: Self-pay | Admitting: Internal Medicine

## 2024-10-20 ENCOUNTER — Other Ambulatory Visit: Payer: Self-pay

## 2024-10-20 MED ORDER — METOPROLOL TARTRATE 25 MG PO TABS: 25.0000 mg | ORAL_TABLET | Freq: Two times a day (BID) | ORAL | 1 refills | Status: AC

## 2024-10-26 ENCOUNTER — Other Ambulatory Visit: Payer: Self-pay | Admitting: Internal Medicine

## 2024-10-28 ENCOUNTER — Other Ambulatory Visit: Payer: Self-pay | Admitting: Internal Medicine

## 2024-10-28 MED ORDER — PREGABALIN 150 MG PO CAPS: 150.0000 mg | ORAL_CAPSULE | Freq: Three times a day (TID) | ORAL | 2 refills | Status: AC

## 2024-11-01 ENCOUNTER — Other Ambulatory Visit: Payer: Self-pay | Admitting: Internal Medicine

## 2024-11-01 DIAGNOSIS — Z0000 Encounter for general adult medical examination without abnormal findings: Secondary | ICD-10-CM | POA: Insufficient documentation

## 2024-11-01 NOTE — Assessment & Plan Note (Signed)
"    Her depression is better.  She do not have any suicidal ideation. "

## 2024-11-01 NOTE — Assessment & Plan Note (Signed)
"    She takes levothyroxine  137 and TSH level done earlier this year was therapeutic. "

## 2024-11-01 NOTE — Assessment & Plan Note (Signed)
"    I will send prescription for tetanus booster.  I will refer her for colonoscopy to Dr. Larene.  I will also do hepatitis-C level  Screening for hepatitis-C infection. "

## 2024-11-01 NOTE — Assessment & Plan Note (Signed)
"    She will continue to use nasal spray. "

## 2024-11-01 NOTE — Assessment & Plan Note (Signed)
 Her blood pressure is well controlled.

## 2024-11-01 NOTE — Assessment & Plan Note (Signed)
"    Her cholesterol and diabetes is well controlled.  She has already seen eye doctor for diabetic eye exam.  Her LDL is target control.  She takes rosuvastatin  5 mg daily and denies any side effect. "

## 2024-11-06 ENCOUNTER — Other Ambulatory Visit: Payer: Self-pay | Admitting: Internal Medicine

## 2024-11-09 ENCOUNTER — Ambulatory Visit

## 2024-11-09 ENCOUNTER — Ambulatory Visit: Admitting: Internal Medicine

## 2024-11-09 VITALS — BP 124/78 | HR 84 | Temp 97.2°F | Resp 18 | Ht 68.0 in | Wt 180.0 lb

## 2024-11-09 DIAGNOSIS — R0981 Nasal congestion: Secondary | ICD-10-CM

## 2024-11-09 DIAGNOSIS — R051 Acute cough: Secondary | ICD-10-CM | POA: Insufficient documentation

## 2024-11-09 DIAGNOSIS — R0989 Other specified symptoms and signs involving the circulatory and respiratory systems: Secondary | ICD-10-CM

## 2024-11-09 LAB — POC COVID19 BINAXNOW: SARS Coronavirus 2 Ag: NEGATIVE

## 2024-11-09 MED ORDER — AMOXICILLIN-POT CLAVULANATE 875-125 MG PO TABS: 1.0000 | ORAL_TABLET | Freq: Two times a day (BID) | ORAL | 0 refills | Status: AC

## 2024-11-09 MED ORDER — SALINE SPRAY 0.65 % NA SOLN: 1.0000 | NASAL | 0 refills | Status: AC | PRN

## 2024-11-09 MED ORDER — FLUTICASONE PROPIONATE 50 MCG/ACT NA SUSP: 1.0000 | Freq: Every day | NASAL | 11 refills | Status: AC

## 2024-11-09 MED ORDER — BENZONATATE 200 MG PO CAPS: 200.0000 mg | ORAL_CAPSULE | Freq: Two times a day (BID) | ORAL | 0 refills | Status: DC | PRN

## 2024-11-09 NOTE — Assessment & Plan Note (Signed)
 Start Augmentin  875 mg BID x 10 days. Use Flonase  and Saline spray PRN

## 2024-11-09 NOTE — Assessment & Plan Note (Signed)
 Take tessalon  peartes BID PRN

## 2024-11-09 NOTE — Progress Notes (Signed)
 "  Acute Office Visit  Subjective:     Patient ID: Jill Robertson, female    DOB: 11-17-1960, 64 y.o.   MRN: [id number]  Chief Complaint  Patient presents with   Cough    Patient is a 64 year old female who presents today for continued symptoms of cough and congestion. She has suffered from these symptoms for the past two months. She took Prednisone  and Azithromycin  but reports minimal relief. Today she states she feels like she is drowning at night from the mucus that is collecting in her chest. She did have sweating when she woke this am. She reports she has weakness with the symptoms. She has not had a chest xray for the symptoms. She was previously a smoker, quitting in 2016.   Cough This is a recurrent problem. The current episode started more than 1 month ago. The problem has been waxing and waning. The cough is Productive of sputum. Associated symptoms include a fever, nasal congestion and shortness of breath. The symptoms are aggravated by lying down. Risk factors for lung disease include smoking/tobacco exposure. She has tried a beta-agonist inhaler, oral steroids and rest for the symptoms. The treatment provided mild relief. Her past medical history is significant for asthma.    Review of Systems  Constitutional:  Positive for fever.  Respiratory:  Positive for cough and shortness of breath.         Objective:    BP 124/78   Pulse 84   Temp (!) 97.2 F (36.2 C)   Resp 18   Ht 5' 8 (1.727 m)   Wt 180 lb (81.6 kg)   SpO2 96%   BMI 27.37 kg/m    Physical Exam Constitutional:      Appearance: Normal appearance.  HENT:     Nose: Congestion present.     Mouth/Throat:     Mouth: Mucous membranes are moist.  Cardiovascular:     Rate and Rhythm: Normal rate and regular rhythm.  Pulmonary:     Breath sounds: Rhonchi present.  Abdominal:     General: Abdomen is flat. Bowel sounds are normal.  Musculoskeletal:        General: Normal range of motion.     Cervical  back: Normal range of motion.  Skin:    General: Skin is warm and dry.  Neurological:     General: No focal deficit present.     Mental Status: She is alert and oriented to person, place, and time.  Psychiatric:        Mood and Affect: Mood normal.        Behavior: Behavior normal.        Thought Content: Thought content normal.        Judgment: Judgment normal.     No results found for any visits on 11/09/24.      Assessment & Plan:   Problem List Items Addressed This Visit       Respiratory   Chest congestion   Augmentin  875 mg BID x 10 days      Relevant Medications   amoxicillin -clavulanate (AUGMENTIN ) 875-125 MG tablet   Sinus congestion   Start Augmentin  875 mg BID x 10 days. Use Flonase  and Saline spray PRN      Relevant Medications   fluticasone  (FLONASE ) 50 MCG/ACT nasal spray   sodium chloride  (OCEAN) 0.65 % SOLN nasal spray     Other   Acute cough - Primary   Take tessalon  peartes BID PRN  Relevant Medications   amoxicillin -clavulanate (AUGMENTIN ) 875-125 MG tablet   benzonatate  (TESSALON ) 200 MG capsule   Other Relevant Orders   POC COVID-19 BinaxNow   Influenza, MDCK, trivalent, PF(Flucelvax egg-free)    No orders of the defined types were placed in this encounter.   Return in about 4 weeks (around 12/07/2024).  Austine Cork, FNP   "

## 2024-11-09 NOTE — Assessment & Plan Note (Signed)
Augmentin 875 mg BID x 10 days

## 2024-11-18 MED ORDER — DEXTROMETHORPHAN-GUAIFENESIN 10-200 MG/10ML PO LIQD: 10.0000 mL | Freq: Four times a day (QID) | ORAL | 1 refills | Status: AC | PRN

## 2024-11-18 NOTE — Addendum Note (Signed)
 Addended by: Eliyanna Ault on: 11/18/2024 03:56 PM   Modules accepted: Orders

## 2024-11-18 NOTE — Progress Notes (Addendum)
 "  Acute Office Visit  Subjective:     Patient ID: Jill Robertson, female    DOB: 01/24/1961, 64 y.o.   MRN: [id number]  Chief Complaint  Patient presents with   Cough    Patient is a 64 year old female who presents today for a telehealth visit concerning cough and congestion that does not appear to be subsiding after two rounds of antibiotics specifically Azithromycin  and Augmentin . She also has used tessalon  pearles PRN and received a previous dose of Prednisone . Patient reports the symptoms have not improved and is gradually getting worse. Patient reports she is coughing up discolored phlegm namely gray in color. Patient denies fever, chills, headaches, ear pain or sore throat. At this time patient will have a referral for a chest xray, will have an order for guaifenesin  and dextromethorphan  cough syrup.   Cough This is a recurrent problem. The current episode started 1 to 4 weeks ago. The problem has been unchanged. The cough is Productive of sputum. Associated symptoms include nasal congestion. Pertinent negatives include no chills, ear pain, fever, headaches or sore throat. The symptoms are aggravated by lying down. She has tried prescription cough suppressant and oral steroids for the symptoms. The treatment provided no relief.     Review of Systems  Constitutional:  Negative for chills and fever.  HENT:  Negative for ear pain and sore throat.   Respiratory:  Positive for cough.   Neurological:  Negative for headaches.        Objective:    BP 124/78   Pulse 84   Temp (!) 97.2 F (36.2 C)   Resp 18   Ht 5' 8 (1.727 m)   Wt 180 lb (81.6 kg)   SpO2 96%   BMI 27.37 kg/m    Physical Exam  Results for orders placed or performed in visit on 11/09/24  POC COVID-19 BinaxNow  Result Value Ref Range   SARS Coronavirus 2 Ag Negative Negative        Assessment & Plan:   Problem List Items Addressed This Visit       Respiratory   Chest congestion   Augmentin  875  mg BID x 10 days      Relevant Medications   amoxicillin -clavulanate (AUGMENTIN ) 875-125 MG tablet   Sinus congestion   Start Augmentin  875 mg BID x 10 days. Use Flonase  and Saline spray PRN      Relevant Medications   fluticasone  (FLONASE ) 50 MCG/ACT nasal spray   sodium chloride  (OCEAN) 0.65 % SOLN nasal spray     Other   Acute cough - Primary   Take tessalon  peartes BID PRN      Relevant Medications   amoxicillin -clavulanate (AUGMENTIN ) 875-125 MG tablet   benzonatate  (TESSALON ) 200 MG capsule   Other Relevant Orders   POC COVID-19 BinaxNow (Completed)    Meds ordered this encounter  Medications   amoxicillin -clavulanate (AUGMENTIN ) 875-125 MG tablet    Sig: Take 1 tablet by mouth 2 (two) times daily for 10 days.    Dispense:  20 tablet    Refill:  0   fluticasone  (FLONASE ) 50 MCG/ACT nasal spray    Sig: Place 1 spray into both nostrils daily.    Dispense:  16 g    Refill:  11   benzonatate  (TESSALON ) 200 MG capsule    Sig: Take 1 capsule (200 mg total) by mouth 2 (two) times daily as needed for cough.    Dispense:  30 capsule    Refill:  0   sodium chloride  (OCEAN) 0.65 % SOLN nasal spray    Sig: Place 1 spray into both nostrils as needed for congestion.    Dispense:  30 mL    Refill:  0    Return in about 4 weeks (around 12/07/2024).  Austine Cork, FNP   "

## 2024-11-20 ENCOUNTER — Other Ambulatory Visit: Payer: Self-pay

## 2024-11-20 DIAGNOSIS — R051 Acute cough: Secondary | ICD-10-CM

## 2024-11-20 MED ORDER — BENZONATATE 200 MG PO CAPS: 200.0000 mg | ORAL_CAPSULE | Freq: Two times a day (BID) | ORAL | 0 refills | Status: DC | PRN

## 2024-11-23 ENCOUNTER — Other Ambulatory Visit: Payer: Self-pay | Admitting: Internal Medicine

## 2024-11-28 ENCOUNTER — Other Ambulatory Visit: Payer: Self-pay | Admitting: Internal Medicine

## 2024-11-28 DIAGNOSIS — R051 Acute cough: Secondary | ICD-10-CM

## 2024-12-03 ENCOUNTER — Other Ambulatory Visit: Payer: Self-pay | Admitting: Internal Medicine

## 2024-12-09 ENCOUNTER — Ambulatory Visit

## 2025-01-05 ENCOUNTER — Ambulatory Visit: Admitting: Internal Medicine
